# Patient Record
Sex: Male | Born: 1975
Health system: Southern US, Community
[De-identification: ages and names within clinical notes are randomized; demographics above are authoritative.]

## PROBLEM LIST (undated history)

## (undated) DIAGNOSIS — I214 Non-ST elevation (NSTEMI) myocardial infarction: Principal | ICD-10-CM

## (undated) DIAGNOSIS — R001 Bradycardia, unspecified: Secondary | ICD-10-CM

## (undated) DIAGNOSIS — M1990 Unspecified osteoarthritis, unspecified site: Secondary | ICD-10-CM

## (undated) DIAGNOSIS — K219 Gastro-esophageal reflux disease without esophagitis: Secondary | ICD-10-CM

## (undated) DIAGNOSIS — R519 Headache, unspecified: Secondary | ICD-10-CM

## (undated) DIAGNOSIS — F419 Anxiety disorder, unspecified: Secondary | ICD-10-CM

## (undated) DIAGNOSIS — J939 Pneumothorax, unspecified: Secondary | ICD-10-CM

## (undated) DIAGNOSIS — I959 Hypotension, unspecified: Secondary | ICD-10-CM

## (undated) DIAGNOSIS — E785 Hyperlipidemia, unspecified: Secondary | ICD-10-CM

## (undated) DIAGNOSIS — D689 Coagulation defect, unspecified: Secondary | ICD-10-CM

## (undated) DIAGNOSIS — Z720 Tobacco use: Secondary | ICD-10-CM

## (undated) DIAGNOSIS — F329 Major depressive disorder, single episode, unspecified: Secondary | ICD-10-CM

## (undated) DIAGNOSIS — I2510 Atherosclerotic heart disease of native coronary artery without angina pectoris: Secondary | ICD-10-CM

## (undated) DIAGNOSIS — R51 Headache: Secondary | ICD-10-CM

## (undated) DIAGNOSIS — F32A Depression, unspecified: Secondary | ICD-10-CM

## (undated) HISTORY — DX: Hypotension, unspecified: I95.9

## (undated) HISTORY — PX: ABSCESS DRAINAGE: SHX1119

## (undated) HISTORY — DX: Hyperlipidemia, unspecified: E78.5

## (undated) HISTORY — DX: Non-ST elevation (NSTEMI) myocardial infarction: I21.4

## (undated) HISTORY — DX: Bradycardia, unspecified: R00.1

## (undated) HISTORY — DX: Coagulation defect, unspecified: D68.9

## (undated) HISTORY — DX: Tobacco use: Z72.0

## (undated) HISTORY — DX: Atherosclerotic heart disease of native coronary artery without angina pectoris: I25.10

## (undated) HISTORY — DX: Gastro-esophageal reflux disease without esophagitis: K21.9

---

## 2000-01-14 ENCOUNTER — Encounter: Payer: Self-pay | Admitting: Emergency Medicine

## 2000-01-14 ENCOUNTER — Emergency Department (HOSPITAL_COMMUNITY): Admission: EM | Admit: 2000-01-14 | Discharge: 2000-01-14 | Payer: Self-pay | Admitting: Emergency Medicine

## 2005-10-28 DIAGNOSIS — J939 Pneumothorax, unspecified: Secondary | ICD-10-CM

## 2005-10-28 HISTORY — DX: Pneumothorax, unspecified: J93.9

## 2006-08-21 ENCOUNTER — Inpatient Hospital Stay (HOSPITAL_COMMUNITY): Admission: EM | Admit: 2006-08-21 | Discharge: 2006-08-24 | Payer: Self-pay | Admitting: Emergency Medicine

## 2006-09-23 ENCOUNTER — Encounter: Admission: RE | Admit: 2006-09-23 | Discharge: 2006-11-20 | Payer: Self-pay | Admitting: Orthopedic Surgery

## 2006-10-14 ENCOUNTER — Encounter: Admission: RE | Admit: 2006-10-14 | Discharge: 2006-10-14 | Payer: Self-pay | Admitting: Orthopedic Surgery

## 2007-02-05 ENCOUNTER — Ambulatory Visit (HOSPITAL_COMMUNITY): Admission: RE | Admit: 2007-02-05 | Discharge: 2007-02-05 | Payer: Self-pay | Admitting: *Deleted

## 2007-05-21 ENCOUNTER — Ambulatory Visit (HOSPITAL_COMMUNITY): Admission: RE | Admit: 2007-05-21 | Discharge: 2007-05-21 | Payer: Self-pay | Admitting: Neurosurgery

## 2007-08-16 ENCOUNTER — Emergency Department (HOSPITAL_COMMUNITY): Admission: EM | Admit: 2007-08-16 | Discharge: 2007-08-16 | Payer: Self-pay | Admitting: Emergency Medicine
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
## 2008-01-13 ENCOUNTER — Emergency Department (HOSPITAL_COMMUNITY): Admission: EM | Admit: 2008-01-13 | Discharge: 2008-01-13 | Payer: Self-pay | Admitting: Emergency Medicine

## 2010-02-08 ENCOUNTER — Emergency Department (HOSPITAL_COMMUNITY): Admission: AC | Admit: 2010-02-08 | Discharge: 2010-02-08 | Payer: Self-pay

## 2010-06-19 ENCOUNTER — Emergency Department (HOSPITAL_COMMUNITY): Admission: EM | Admit: 2010-06-19 | Discharge: 2010-06-20 | Payer: Self-pay | Admitting: Emergency Medicine

## 2011-01-11 LAB — DIFFERENTIAL
Eosinophils Absolute: 0.2 10*3/uL (ref 0.0–0.7)
Lymphocytes Relative: 34 % (ref 12–46)
Lymphs Abs: 2.5 10*3/uL (ref 0.7–4.0)
Neutrophils Relative %: 56 % (ref 43–77)

## 2011-01-11 LAB — CBC
HCT: 45.8 % (ref 39.0–52.0)
Hemoglobin: 16.7 g/dL (ref 13.0–17.0)
MCH: 33.7 pg (ref 26.0–34.0)
MCHC: 36.5 g/dL — ABNORMAL HIGH (ref 30.0–36.0)
MCV: 92.5 fL (ref 78.0–100.0)

## 2011-01-11 LAB — URINALYSIS, ROUTINE W REFLEX MICROSCOPIC
Bilirubin Urine: NEGATIVE
Glucose, UA: NEGATIVE mg/dL
Ketones, ur: NEGATIVE mg/dL
pH: 6.5 (ref 5.0–8.0)

## 2011-01-11 LAB — COMPREHENSIVE METABOLIC PANEL
ALT: 15 U/L (ref 0–53)
BUN: 8 mg/dL (ref 6–23)
CO2: 26 mEq/L (ref 19–32)
Calcium: 9.4 mg/dL (ref 8.4–10.5)
Creatinine, Ser: 1 mg/dL (ref 0.4–1.5)
GFR calc non Af Amer: >60 mL/min (ref >60)
Glucose, Bld: 91 mg/dL (ref 70–99)

## 2011-01-16 LAB — COMPREHENSIVE METABOLIC PANEL
ALT: 20 U/L (ref 0–53)
AST: 21 U/L (ref 0–37)
CO2: 23 mEq/L (ref 19–32)
Chloride: 107 mEq/L (ref 96–112)
Creatinine, Ser: 0.87 mg/dL (ref 0.4–1.5)
GFR calc Af Amer: >60 mL/min (ref >60)
GFR calc non Af Amer: >60 mL/min (ref >60)
Total Bilirubin: 0.5 mg/dL (ref 0.3–1.2)

## 2011-01-16 LAB — CBC
MCV: 98.2 fL (ref 78.0–100.0)
RBC: 4.66 MIL/uL (ref 4.22–5.81)
WBC: 10.2 10*3/uL (ref 4.0–10.5)

## 2011-01-16 LAB — APTT: aPTT: 28 seconds (ref 24–37)
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
## 2012-01-29 ENCOUNTER — Ambulatory Visit (INDEPENDENT_AMBULATORY_CARE_PROVIDER_SITE_OTHER): Payer: BC Managed Care – PPO | Admitting: Family Medicine

## 2012-01-29 ENCOUNTER — Ambulatory Visit
Admission: RE | Admit: 2012-01-29 | Discharge: 2012-01-29 | Disposition: A | Discharge status: Home / Self Care | Payer: BC Managed Care – PPO | Source: Ambulatory Visit | Attending: Family Medicine | Admitting: Family Medicine

## 2012-01-29 ENCOUNTER — Telehealth: Payer: Self-pay

## 2012-01-29 DIAGNOSIS — IMO0001 Reserved for inherently not codable concepts without codable children: Secondary | ICD-10-CM

## 2012-01-29 DIAGNOSIS — R209 Unspecified disturbances of skin sensation: Secondary | ICD-10-CM

## 2012-01-29 LAB — COMPREHENSIVE METABOLIC PANEL
ALT: 17 U/L (ref 0–53)
AST: 25 U/L (ref 0–37)
Albumin: 4.6 g/dL (ref 3.5–5.2)
Alkaline Phosphatase: 55 U/L (ref 39–117)
BUN: 7 mg/dL (ref 6–23)
CO2: 26 mEq/L (ref 19–32)
Calcium: 9.6 mg/dL (ref 8.4–10.5)
Chloride: 106 mEq/L (ref 96–112)
Creat: 0.83 mg/dL (ref 0.50–1.35)
Glucose, Bld: 81 mg/dL (ref 70–99)
Potassium: 4.2 mEq/L (ref 3.5–5.3)
Sodium: 139 mEq/L (ref 135–145)
Total Bilirubin: 0.4 mg/dL (ref 0.3–1.2)
Total Protein: 7 g/dL (ref 6.0–8.3)

## 2012-01-29 LAB — POCT SEDIMENTATION RATE: POCT SED RATE: 6 mm/hr (ref 0–22)

## 2012-01-29 LAB — POCT CBC
Granulocyte percent: 59.3 %G (ref 37–80)
HCT, POC: 48 % (ref 43.5–53.7)
Hemoglobin: 16.2 g/dL (ref 14.1–18.1)
Lymph, poc: 2 (ref 0.6–3.4)
MCH, POC: 32.3 pg — AB (ref 27–31.2)
MCHC: 33.8 g/dL (ref 31.8–35.4)
MCV: 95.8 fL (ref 80–97)
MID (cbc): 0.5 (ref 0–0.9)
MPV: 8.8 fL (ref 0–99.8)
POC Granulocyte: 3.6 (ref 2–6.9)
POC LYMPH PERCENT: 32.5 %L (ref 10–50)
POC MID %: 8.2 %M (ref 0–12)
Platelet Count, POC: 315 10*3/uL (ref 142–424)
RBC: 5.01 M/uL (ref 4.69–6.13)
RDW, POC: 14.7 %
WBC: 6.1 10*3/uL (ref 4.6–10.2)

## 2012-01-29 LAB — GLUCOSE, POCT (MANUAL RESULT ENTRY): POC Glucose: 77

## 2012-01-29 NOTE — Telephone Encounter (Signed)
One, patient would like to know when his labs will be back.  Two, patient wants to know if it is okay for him to go to the beach tomorrow.   He was seen today by Dr. Milus Glazier

## 2012-01-29 NOTE — Telephone Encounter (Signed)
Per Alycia Rossetti advised pt to stay local and wait on lab results.  He wanted to know if the CT is normal then what else could it be.  He mentioned something about pinched nerve and stated that you guys only brought it up once.  He states that his numbness is now just in his hands and face.  He also complains of headache.  Please advise.

## 2012-01-29 NOTE — Progress Notes (Signed)
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 He denies any headache or head injury. He did have some achiness after work yesterday and a friend gave him Percocet which he took. He has a family history of strokes there is paternal grandfather and maternal aunt.  He says he has had some blurry vision and it may have been double vision but is not sure but this is cleared. [name] had no chest pain trouble swallowing or movement disorder.  Patient denies taking recreational drugs.  Objective: Patient brought back urgently   Patient states that he did not go to work today but he appears to have been at work in the sense that he has hand surgery is closed her soiled as it didn't work. Alert young adult male with an unusual affect who seems to be disconnected. Her HEENT: Unremarkable  Neck: Supple no adenopathy  Chest: Clear  Heart: Regular no murmur or gallop  Abdomen: Soft nontender no HSM no masses nontender  Neuro exam: Alert with unusual affect, cranial nerves III through XII intact  Reflexes symmetric and normal in the biceps triceps knee jerks and ankle jerks  Moving toward chimneys equally gait normal  Skin: Unremarkable-warm and dry  Assessment: Atypical syndrome of paresthesias which are not getting into any usual diagnostic pattern  Plan: CT scan, lab tests to rule out serious causes. This may be a conversion reaction or early schizophrenia.

## 2012-01-30 ENCOUNTER — Encounter: Payer: Self-pay | Admitting: Family Medicine

## 2012-01-30 NOTE — Telephone Encounter (Signed)
LMOM to CB. 

## 2012-01-30 NOTE — Telephone Encounter (Signed)
As I explained to the patient, this may be a viral syndrome or unexplained transient nerve dysfunction.  We are not finding anything seriously wrong.  I suggest he give the symptoms several days to resolve.  If he worsens or develops new bothersome symptoms, he should return for reevaluation

## 2012-01-31 NOTE — Telephone Encounter (Signed)
Patient stated he spoke with someone in our office yesterday and all symptoms have resolved.

## 2012-09-09 ENCOUNTER — Encounter (HOSPITAL_COMMUNITY): Payer: Self-pay | Admitting: *Deleted

## 2012-09-09 ENCOUNTER — Emergency Department (HOSPITAL_COMMUNITY)
Admission: EM | Admit: 2012-09-09 | Discharge: 2012-09-09 | Disposition: A | Discharge status: Home / Self Care | Payer: BC Managed Care – PPO | Source: Home / Self Care | Attending: Family Medicine | Admitting: Family Medicine

## 2012-09-09 ENCOUNTER — Emergency Department (INDEPENDENT_AMBULATORY_CARE_PROVIDER_SITE_OTHER): Payer: BC Managed Care – PPO

## 2012-09-09 DIAGNOSIS — M79609 Pain in unspecified limb: Secondary | ICD-10-CM

## 2012-09-09 DIAGNOSIS — M79642 Pain in left hand: Secondary | ICD-10-CM

## 2012-09-09 HISTORY — DX: Pneumothorax, unspecified: J93.9

## 2012-09-09 MED ORDER — IBUPROFEN 800 MG PO TABS: 800.0000 mg | ORAL_TABLET | Freq: Three times a day (TID) | ORAL | Status: DC

## 2012-09-09 NOTE — ED Provider Notes (Signed)
History     CSN: [id number]  Arrival date & time 09/09/12  1747   First MD Initiated Contact with Patient 09/09/12 1841      Chief Complaint  Patient presents with  . Hand Injury    (Consider location/radiation/quality/duration/timing/severity/associated sxs/prior treatment) Patient is a 36 y.o. male presenting with hand pain. The history is provided by the patient.  Hand Pain This is a new problem. The current episode started yesterday. The problem occurs hourly. The problem has not changed since onset.The symptoms are aggravated by bending and twisting. Nothing relieves the symptoms. He has tried nothing for the symptoms. The treatment provided no relief.  pt works in Lobbyist work, states he is not sure if he hit it on something at work yesterday, pain started in hand last night, swelling noted this morning.  Past Medical History  Diagnosis Date  . Pneumothorax     History reviewed. No pertinent past surgical history.  Family History  Problem Relation Age of Onset  . Heart attack Other   . Stroke Maternal Aunt     History  Substance Use Topics  . Smoking status: Current Every Day Smoker -- 1.0 packs/day  . Smokeless tobacco: Not on file  . Alcohol Use: No      Review of Systems  Musculoskeletal: Positive for joint swelling and arthralgias.  All other systems reviewed and are negative.    Allergies  Review of patient's allergies indicates no known allergies.  Home Medications   Current Outpatient Rx  Name  Route  Sig  Dispense  Refill  . IBUPROFEN 800 MG PO TABS   Oral   Take 1 tablet (800 mg total) by mouth 3 (three) times daily.   21 tablet   0     BP 118/67  Pulse 75  Temp 98.3 F (36.8 C) (Oral)  Resp 16  SpO2 97%  Physical Exam  Nursing note and vitals reviewed. Constitutional: He is oriented to person, place, and time. Vital signs are normal. He appears well-developed and well-nourished. He is active and cooperative.  HENT:  Head:  Normocephalic.  Eyes: Conjunctivae normal are normal. Pupils are equal, round, and reactive to light. No scleral icterus.  Neck: Trachea normal. Neck supple.  Cardiovascular: Normal rate, regular rhythm and normal heart sounds.   Pulmonary/Chest: Effort normal and breath sounds normal.  Musculoskeletal: Normal range of motion.       Right hand: Normal.       Left hand: Normal.       Hands: Neurological: He is alert and oriented to person, place, and time. He has normal strength. No cranial nerve deficit or sensory deficit. GCS eye subscore is 4. GCS verbal subscore is 5. GCS motor subscore is 6.  Skin: Skin is warm and dry.  Psychiatric: He has a normal mood and affect. His speech is normal and behavior is normal. Judgment and thought content normal. Cognition and memory are normal.    ED Course  Procedures (including critical care time)  Labs Reviewed - No data to display Dg Hand Complete Left  09/09/2012  *RADIOLOGY REPORT*  Clinical Data: Pain and soft tissue swelling  LEFT HAND - COMPLETE 3+ VIEW  Comparison: 01/13/2008  Findings: No evidence of fracture or dislocation.  There is chronic deformity of the DIP joint of the index finger related to old trauma.  There is a 2 mm radiopaque foreign object in the soft tissues of the volar to the interphalangeal joint that looks like a small  piece of wire.  IMPRESSION: Chronic deformity of the DIP joint of the index finger related to previous trauma.  Small radiopaque foreign object within the soft tissues volar to the interphalangeal joint of the thumb.   Original Report Authenticated By: Paulina Fusi, M.D.      1. Hand pain, left       MDM  Ace wrap, ibuprofen for pain, follow up with orthopedist as needed.        Johnsie Kindred, NP 09/09/12 1907

## 2012-09-09 NOTE — ED Notes (Signed)
L hand sore last night and noted swelling to dorsum of hand this morning.  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

## 2012-09-10 NOTE — ED Provider Notes (Signed)
Medical screening examination/treatment/procedure(s) were performed by resident physician or non-physician practitioner and as supervising physician I was immediately available for consultation/collaboration.   Joya Willmott DOUGLAS MD.    Jyra Lagares D Thurmond Hildebran, MD 09/10/12 2125 

## 2012-12-06 ENCOUNTER — Emergency Department (HOSPITAL_COMMUNITY)
Admission: EM | Admit: 2012-12-06 | Discharge: 2012-12-06 | Disposition: A | Discharge status: Home / Self Care | Payer: BC Managed Care – PPO | Source: Home / Self Care

## 2012-12-06 ENCOUNTER — Emergency Department (INDEPENDENT_AMBULATORY_CARE_PROVIDER_SITE_OTHER): Payer: BC Managed Care – PPO

## 2012-12-06 ENCOUNTER — Encounter (HOSPITAL_COMMUNITY): Payer: Self-pay | Admitting: Emergency Medicine

## 2012-12-06 DIAGNOSIS — R0789 Other chest pain: Secondary | ICD-10-CM

## 2012-12-06 MED ORDER — GI COCKTAIL ~~LOC~~
30.0000 mL | Freq: Once | ORAL | Status: AC
  Administered 2012-12-06: 30 mL via ORAL

## 2012-12-06 MED ORDER — GI COCKTAIL ~~LOC~~
ORAL | Status: AC
  Filled 2012-12-06: qty 30

## 2012-12-06 NOTE — ED Provider Notes (Signed)
Ricky [name] is a 37 y.o. male who presents to Urgent Care today for chest tightness present for 10 days. No radiating pain weakness or numbness Worse with eating. Not worse with exertion. No palpitations syncope. Patient also noted some shortness of breath.  Denies cough. [name] tried some ibuprofen which has not helped.  He denies any history of cardiac problems. He does note a pertinent past medical history for pneumothorax as a child.   He feels well otherwise   PMH reviewed. Pneumothorax History  Substance Use Topics  . Smoking status: Current Every Day Smoker -- 0.50 packs/day    Types: Cigarettes  . Smokeless tobacco: Not on file  . Alcohol Use: No   ROS as above Medications reviewed. No current facility-administered medications for this encounter.   Current Outpatient Prescriptions  Medication Sig Dispense Refill  . ibuprofen (ADVIL,MOTRIN) 800 MG tablet Take 1 tablet (800 mg total) by mouth 3 (three) times daily.  21 tablet  0    Exam:  BP 119/79  Pulse 73  Temp(Src) 98.6 F (37 C) (Oral)  Resp 18  SpO2 97% Gen: Well NAD HEENT: EOMI,  MMM Lungs: CTABL Nl WOB Heart: RRR no MRG Abd: NABS, NT, ND Exts: Non edematous BL  LE, warm and well perfused.   No improvement with GI cocktail. No results found for this or any previous visit (from the past 24 hour(s)). Dg Chest 2 View  12/06/2012  *RADIOLOGY REPORT*  Clinical Data: Left-sided chest pain for 10 days, smoking history  CHEST - 2 VIEW  Comparison: Chest x-ray of 02/08/2010  Findings: No active infiltrate or effusion is seen.  Mediastinal contours appear normal.  The heart is within normal limits in size. No skeletal abnormality is seen.  IMPRESSION: No active lung disease.   Original Report Authenticated By: Dwyane Dee, M.D.     Twelve-lead EKG: Normal sinus rhythm at 80 beats per minute. No ST segment abnormalities. Possible left atrial enlargement  Assessment and Plan: 37 y.o. male with chest tightness.  Possibly  anxiety. EKG and chest x-ray are essentially normal.  Very low probability of PE. However will obtain a d-dimer.  Will call patient if d-dimer positive.  Refer patient to Cardiology.  Discussed warning signs or symptoms. Please see discharge instructions. Patient expresses understanding.      Rodolph Bong, MD 12/06/12 1418  Addendum: D-dimer negative. Call patient explained negative test results followup with cardiology.  Rodolph Bong, MD 12/06/12 [phone]

## 2012-12-06 NOTE — ED Notes (Signed)
Pt c/o left chest pain and tightness with sob. Some episodes of dizziness x 1 wk 1/2. Family hx of heart problems.

## 2012-12-07 NOTE — ED Provider Notes (Signed)
Medical screening examination/treatment/procedure(s) were performed by resident physician or non-physician practitioner and as supervising physician I was immediately available for consultation/collaboration.   Trice Aspinall DOUGLAS MD.   Beauty Pless D Zedrick Springsteen, MD 12/07/12 1442 

## 2012-12-08 ENCOUNTER — Other Ambulatory Visit (HOSPITAL_COMMUNITY): Payer: Self-pay | Admitting: Interventional Radiology

## 2013-04-19 ENCOUNTER — Encounter (HOSPITAL_COMMUNITY): Payer: Self-pay

## 2013-04-19 DIAGNOSIS — L732 Hidradenitis suppurativa: Secondary | ICD-10-CM | POA: Diagnosis present

## 2013-04-19 DIAGNOSIS — F17200 Nicotine dependence, unspecified, uncomplicated: Secondary | ICD-10-CM | POA: Diagnosis present

## 2013-04-19 DIAGNOSIS — L988 Other specified disorders of the skin and subcutaneous tissue: Secondary | ICD-10-CM | POA: Diagnosis present

## 2013-04-19 DIAGNOSIS — IMO0002 Reserved for concepts with insufficient information to code with codable children: Principal | ICD-10-CM | POA: Diagnosis present

## 2013-04-19 LAB — CBC WITH DIFFERENTIAL/PLATELET
Basophils Absolute: 0 10*3/uL (ref 0.0–0.1)
Eosinophils Absolute: 0.1 10*3/uL (ref 0.0–0.7)
Eosinophils Relative: 0 % (ref 0–5)
Lymphs Abs: 2.8 10*3/uL (ref 0.7–4.0)
MCH: 34.1 pg — ABNORMAL HIGH (ref 26.0–34.0)
MCV: 92.2 fL (ref 78.0–100.0)
Neutrophils Relative %: 80 % — ABNORMAL HIGH (ref 43–77)
Platelets: 267 10*3/uL (ref 150–400)
RBC: 4.75 MIL/uL (ref 4.22–5.81)
RDW: 13.6 % (ref 11.5–15.5)
WBC: 20.8 10*3/uL — ABNORMAL HIGH (ref 4.0–10.5)

## 2013-04-19 LAB — POCT I-STAT, CHEM 8
HCT: 49 % (ref 39.0–52.0)
Hemoglobin: 16.7 g/dL (ref 13.0–17.0)
Potassium: 3.5 mEq/L (ref 3.5–5.1)
Sodium: 138 mEq/L (ref 135–145)
TCO2: 23 mmol/L (ref 0–100)

## 2013-04-19 NOTE — ED Notes (Signed)
Pt c/o "lumps" to his Left axillary today approx 0700 this am and by this afternoon pt noted multiple abscess to bilary axillary region, all over body aches, and fever. Several small red areas noted on exam, increase pain w/palpation.

## 2013-04-20 ENCOUNTER — Inpatient Hospital Stay (HOSPITAL_COMMUNITY)
Admission: EM | Admit: 2013-04-20 | Discharge: 2013-04-22 | DRG: 278 | Disposition: A | Discharge status: Home / Self Care | Payer: BC Managed Care – PPO | Attending: Internal Medicine | Admitting: Internal Medicine

## 2013-04-20 DIAGNOSIS — L732 Hidradenitis suppurativa: Secondary | ICD-10-CM

## 2013-04-20 DIAGNOSIS — L0291 Cutaneous abscess, unspecified: Secondary | ICD-10-CM

## 2013-04-20 DIAGNOSIS — D72829 Elevated white blood cell count, unspecified: Secondary | ICD-10-CM | POA: Diagnosis present

## 2013-04-20 DIAGNOSIS — IMO0002 Reserved for concepts with insufficient information to code with codable children: Principal | ICD-10-CM

## 2013-04-20 DIAGNOSIS — L0390 Cellulitis, unspecified: Secondary | ICD-10-CM

## 2013-04-20 DIAGNOSIS — L02412 Cutaneous abscess of left axilla: Secondary | ICD-10-CM

## 2013-04-20 DIAGNOSIS — L02411 Cutaneous abscess of right axilla: Secondary | ICD-10-CM | POA: Diagnosis present

## 2013-04-20 MED ORDER — ONDANSETRON HCL 4 MG/2ML IJ SOLN
4.0000 mg | Freq: Once | INTRAMUSCULAR | Status: AC
  Administered 2013-04-20: 4 mg via INTRAVENOUS
  Filled 2013-04-20: qty 2

## 2013-04-20 MED ORDER — SODIUM CHLORIDE 0.9 % IJ SOLN
3.0000 mL | Freq: Two times a day (BID) | INTRAMUSCULAR | Status: DC
  Administered 2013-04-20 – 2013-04-21 (×2): 3 mL via INTRAVENOUS

## 2013-04-20 MED ORDER — ACETAMINOPHEN 650 MG RE SUPP: 650.0000 mg | Freq: Four times a day (QID) | RECTAL | Status: DC | PRN

## 2013-04-20 MED ORDER — VANCOMYCIN HCL 10 G IV SOLR
1250.0000 mg | Freq: Two times a day (BID) | INTRAVENOUS | Status: DC
  Administered 2013-04-20 – 2013-04-22 (×5): 1250 mg via INTRAVENOUS
  Filled 2013-04-20 (×6): qty 1250

## 2013-04-20 MED ORDER — PIPERACILLIN-TAZOBACTAM 3.375 G IVPB 30 MIN
3.3750 g | INTRAVENOUS | Status: AC
  Administered 2013-04-20: 3.375 g via INTRAVENOUS
  Filled 2013-04-20: qty 50

## 2013-04-20 MED ORDER — ACETAMINOPHEN 325 MG PO TABS
650.0000 mg | ORAL_TABLET | Freq: Four times a day (QID) | ORAL | Status: DC | PRN
  Administered 2013-04-20 – 2013-04-21 (×3): 650 mg via ORAL
  Filled 2013-04-20 (×5): qty 2

## 2013-04-20 MED ORDER — FENTANYL CITRATE 0.05 MG/ML IJ SOLN
50.0000 ug | INTRAMUSCULAR | Status: DC | PRN
  Administered 2013-04-20 – 2013-04-21 (×9): 50 ug via INTRAVENOUS
  Filled 2013-04-20 (×9): qty 2

## 2013-04-20 MED ORDER — VANCOMYCIN HCL IN DEXTROSE 1-5 GM/200ML-% IV SOLN
1000.0000 mg | Freq: Once | INTRAVENOUS | Status: AC
  Administered 2013-04-20: 1000 mg via INTRAVENOUS
  Filled 2013-04-20: qty 200

## 2013-04-20 MED ORDER — PIPERACILLIN-TAZOBACTAM 3.375 G IVPB
3.3750 g | Freq: Three times a day (TID) | INTRAVENOUS | Status: DC
  Administered 2013-04-20 – 2013-04-22 (×5): 3.375 g via INTRAVENOUS
  Filled 2013-04-20 (×8): qty 50

## 2013-04-20 NOTE — Progress Notes (Signed)
[facility] PROGRESS NOTE  Ricky [name] ZOX:[id number] DOB: 1976/10/13 DOA: 04/20/2013 PCP: No PCP Per Patient  Assessment/Plan: 1. Bilateral axillary Hidraadenitis - patient has marginal fever; leukocytosis with left shift  - more significant on the left than right, no significant fluctuant abscess - continue vancomycin, add zosyn per surgery recommendation - warm compresses - pain control  - consult surgery to see if the left axilla need's I&D  Code Status: Full Family Communication: none Disposition Plan: inpatient   Consultants:  General surgery  Procedures:  none  Antibiotics:  vancomycin  HPI/Subjective: Patient has experienced intermittent chills during the day with low-grade fever. He still complains of moderate pain in his axilla. No other complaints.   Objective: Filed Vitals:   04/19/13 2349 04/20/13 0229 04/20/13 0333 04/20/13 0615  BP: 103/67 103/52 122/68 104/53  Pulse: 75 75 87 103  Temp: 98.4 F (36.9 C)  98.5 F (36.9 C) 100.2 F (37.9 C)  TempSrc: Oral  Oral Oral  Resp: 14 18 18 18   Height:   5\' 10"  (1.778 m)   Weight:   79.379 kg (175 lb)   SpO2: 100% 99% 100% 97%    Intake/Output Summary (Last 24 hours) at 04/20/13 1011 Last data filed at 04/20/13 0615  Gross per 24 hour  Intake    180 ml  Output      0 ml  Net    180 ml   Filed Weights   04/20/13 0333  Weight: 79.379 kg (175 lb)    Exam:   General:  WDWN male in no apparent distress  Cardiovascular: regular rate and rhythm, no murmurs, gallops, or rubs  Respiratory: clear to auscultation bilaterally, no rales or rhonchi, no increased work of breathing.   Abdomen: +BS, soft, nontender, no distension, no masses or organomegaly  Skin: multiple 2-3 cm erythematous papules in axilla bilaterally, significant induration noted; no punctum, clear fluctuance, or drainage noted. Larger 3 cm x 1 cm carbuncle in left axilla, indurated, but no clear fluctuance.   Data  Reviewed: Basic Metabolic Panel:  Recent Labs Lab 04/19/13 2028  NA 138  K 3.5  CL 104  GLUCOSE 93  BUN 11  CREATININE 1.20   Liver Function Tests: No results found for this basename: AST, ALT, ALKPHOS, BILITOT, PROT, ALBUMIN,  in the last 168 hours No results found for this basename: LIPASE, AMYLASE,  in the last 168 hours No results found for this basename: AMMONIA,  in the last 168 hours CBC:  Recent Labs Lab 04/19/13 2017 04/19/13 2028  WBC 20.8*  --   NEUTROABS 16.7*  --   HGB 16.2 16.7  HCT 43.8 49.0  MCV 92.2  --   PLT 267  --    Cardiac Enzymes: No results found for this basename: CKTOTAL, CKMB, CKMBINDEX, TROPONINI,  in the last 168 hours BNP (last 3 results) No results found for this basename: PROBNP,  in the last 8760 hours CBG: No results found for this basename: GLUCAP,  in the last 168 hours  No results found for this or any previous visit (from the past 240 hour(s)).   Studies: No results found.  Scheduled Meds: . sodium chloride  3 mL Intravenous Q12H  . vancomycin  1,250 mg Intravenous Q12H   Continuous Infusions:   Principal Problem:   Abscess of axilla, left Active Problems:   Abscess of axilla, right   Leukocytosis, unspecified   Maris Berger PA-S    [facility]  If 7PM-7AM, please contact  night-coverage at [email or web address], password [name] 04/20/2013, 10:11 AM  LOS: 0 days    Attending Patient seen and examined, agree with the assessment and plan. Admitted with B/L Hidradenitis-left axilla looks to be somewhat fluctuant, consult CCS. Monitor clinically to see if it evolves into a frank abscess.  S Tilda Samudio

## 2013-04-20 NOTE — Consult Note (Signed)
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  Non-operative management at this point, no palpable abscess noted.  Warm compresses and IV VancoMarta Lamas. Gae Bon, MD, FACS [phone] [facility]

## 2013-04-20 NOTE — Consult Note (Signed)
Ricky [name] Quaker [birth year]  [id number].    Requesting MD: Louie Bun, PA-C Chief Complaint/Reason for Consult: B/L axillary abscesses HPI: 37 y/o male c/o 1 day history of small red bumps under his left axilla.  He also did not feel well, started to have fevers, chills, and a headache as the day went on.  He noticed the red bumps spreading in multiple areas in both axillas.  No N/V/D, abdominal pain, changes in bowel or bladder habits.  No trauma or recent shaving to the area.  He denies ever having this in the past or similar lesions anywhere else like the groin or buttock.  He notes he was in the hot tube this past weekend, but he denies any bathing suite rash.  No other exposures from family/friends with similar lesions.    ROS: All systems reviewed and otherwise negative except for as above  Family History  Problem Relation Age of Onset  . Heart attack Other   . Stroke Maternal Aunt     Past Medical History  Diagnosis Date  . Pneumothorax     History reviewed. No pertinent past surgical history.  Social History:  reports that he has been smoking Cigarettes.  He has been smoking about 1 packs per day for last 15 years. He does not have any smokeless tobacco history on file. He reports that he does not drink alcohol or use illicit drugs.  Allergies: No Known Allergies  Medications Prior to Admission  Medication Sig Dispense Refill  . Ibuprofen (ADVIL PO) Take 3-4 tablets by mouth once.        Blood pressure 104/53, pulse 103, temperature 100.2 F (37.9 C), temperature source Oral, resp. rate 18, height 5\' 10"  (1.778 m), weight 175 lb (79.379 kg), SpO2 97.00%. Physical Exam: General: pleasant, WD/WN white male who is laying in bed in NAD HEENT: head is normocephalic, atraumatic.  Sclera are noninjected.  PERRL.  Ears and nose without any masses or lesions.  Mouth is pink and moist Heart: regular, rate, and rhythm.  No obvious murmurs, gallops, or rubs noted.  Palpable pedal  pulses bilaterally Lungs: CTAB, no wheezes, rhonchi, or rales noted.  Respiratory effort nonlabored Abd: soft, NT/ND, +BS, no masses, hernias, or organomegaly MS: all 4 extremities are symmetrical with no cyanosis, clubbing, or edema. Skin: Multiple erythematous papules approximately 2-3cm round in b/l axilla R<L, significant induration, but no clear area of fluctuance; no punctum, ulceration or drainage; larger carbuncle in left axilla approximately 3cm x 0.75cm which is largely indurated, but no fluctuance Psych: A&Ox3 with an appropriate affect.  Results for orders placed during the hospital encounter of 04/20/13 (from the past 48 hour(s))  CBC WITH DIFFERENTIAL     Status: Abnormal   Collection Time    04/19/13  8:17 PM      Result Value Range   WBC 20.8 (*) 4.0 - 10.5 K/uL   RBC 4.75  4.22 - 5.81 MIL/uL   Hemoglobin 16.2  13.0 - 17.0 g/dL   HCT 40.9  81.1 - 91.4 %   MCV 92.2  78.0 - 100.0 fL   MCH 34.1 (*) 26.0 - 34.0 pg   MCHC 37.0 (*) 30.0 - 36.0 g/dL   RDW 78.2  95.6 - 21.3 %   Platelets 267  150 - 400 K/uL   Neutrophils Relative % 80 (*) 43 - 77 %   Neutro Abs 16.7 (*) 1.7 - 7.7 K/uL   Lymphocytes Relative 13  12 - 46 %  Lymphs Abs 2.8  0.7 - 4.0 K/uL   Monocytes Relative 7  3 - 12 %   Monocytes Absolute 1.4 (*) 0.1 - 1.0 K/uL   Eosinophils Relative 0  0 - 5 %   Eosinophils Absolute 0.1  0.0 - 0.7 K/uL   Basophils Relative 0  0 - 1 %   Basophils Absolute 0.0  0.0 - 0.1 K/uL  POCT I-STAT, CHEM 8     Status: None   Collection Time    04/19/13  8:28 PM      Result Value Range   Sodium 138  135 - 145 mEq/L   Potassium 3.5  3.5 - 5.1 mEq/L   Chloride 104  96 - 112 mEq/L   BUN 11  6 - 23 mg/dL   Creatinine, Ser 1.61  0.50 - 1.35 mg/dL   Glucose, Bld 93  70 - 99 mg/dL   Calcium, Ion 0.96  0.45 - 1.23 mmol/L   TCO2 23  0 - 100 mmol/L   Hemoglobin 16.7  13.0 - 17.0 g/dL   HCT 40.9  81.1 - 91.4 %   No results found.     Assessment/Plan B/L axillary  furuncles/carbuncles, more significant on the left, no significant fluctuant abscess 1.  Continue IV antibiotics 2.  Pain control 3.  Okay for regular diet 4.  Hot packs 5.  Add Zosyn to cover for pseudomonas infection from recent hot tub exposure 6.  Would get MRSA swab and use contact protocols   DORT, Kymir Coles 04/20/2013, 9:55 AM Pager: [phone]

## 2013-04-20 NOTE — ED Provider Notes (Signed)
History    CSN: [id number] Arrival date & time 04/19/13  2003  First MD Initiated Contact with Patient 04/20/13 0123     Chief Complaint  Patient presents with  . Abscess  . Fever   (Consider location/radiation/quality/duration/timing/severity/associated sxs/prior Treatment) HPI Hx per PT - woke up today not feeling well, noticed small bump L axilla. By the afternoon had large painful areas in bilateral axilla, with fever and chills. No trauma, no shaving, no h/o same.  He was in a hot tub over the weekend but denies any other known exposures. Symptoms MOD in severity. No N/V. No cats in the house  Past Medical History  Diagnosis Date  . Pneumothorax    History reviewed. No pertinent past surgical history. Family History  Problem Relation Age of Onset  . Heart attack Other   . Stroke Maternal Aunt    History  Substance Use Topics  . Smoking status: Current Every Day Smoker -- 0.50 packs/day    Types: Cigarettes  . Smokeless tobacco: Not on file  . Alcohol Use: No    Review of Systems  Constitutional: Positive for fever and chills.  HENT: Negative for neck pain and neck stiffness.   Eyes: Negative for visual disturbance.  Respiratory: Negative for shortness of breath.   Cardiovascular: Negative for chest pain.  Gastrointestinal: Negative for vomiting.  Genitourinary: Negative for dysuria.  Musculoskeletal: Negative for back pain.  Skin: Positive for wound. Negative for rash.  Neurological: Negative for headaches.  All other systems reviewed and are negative.    Allergies  Review of patient's allergies indicates no known allergies.  Home Medications   Current Outpatient Rx  Name  Route  Sig  Dispense  Refill  . Ibuprofen (ADVIL PO)   Oral   Take 3-4 tablets by mouth once.          BP 103/67  Pulse 75  Temp(Src) 98.4 F (36.9 C) (Oral)  Resp 14  SpO2 100% Physical Exam  Constitutional: He is oriented to person, place, and time. He appears  well-developed and well-nourished.  HENT:  Head: Normocephalic and atraumatic.  Eyes: EOM are normal. Pupils are equal, round, and reactive to light.  Neck: Neck supple.  Cardiovascular: Regular rhythm and intact distal pulses.   Pulmonary/Chest: Effort normal. No respiratory distress.  Musculoskeletal: Normal range of motion.  L > R axilla areas of tenderness and fluctuance, no active drainage   Neurological: He is alert and oriented to person, place, and time.  Skin: Skin is warm and dry.    ED Course  Procedures (including critical care time)  Results for orders placed during the hospital encounter of 04/20/13  CBC WITH DIFFERENTIAL      Result Value Range   WBC 20.8 (*) 4.0 - 10.5 K/uL   RBC 4.75  4.22 - 5.81 MIL/uL   Hemoglobin 16.2  13.0 - 17.0 g/dL   HCT 40.9  81.1 - 91.4 %   MCV 92.2  78.0 - 100.0 fL   MCH 34.1 (*) 26.0 - 34.0 pg   MCHC 37.0 (*) 30.0 - 36.0 g/dL   RDW 78.2  95.6 - 21.3 %   Platelets 267  150 - 400 K/uL   Neutrophils Relative % 80 (*) 43 - 77 %   Neutro Abs 16.7 (*) 1.7 - 7.7 K/uL   Lymphocytes Relative 13  12 - 46 %   Lymphs Abs 2.8  0.7 - 4.0 K/uL   Monocytes Relative 7  3 - 12 %  Monocytes Absolute 1.4 (*) 0.1 - 1.0 K/uL   Eosinophils Relative 0  0 - 5 %   Eosinophils Absolute 0.1  0.0 - 0.7 K/uL   Basophils Relative 0  0 - 1 %   Basophils Absolute 0.0  0.0 - 0.1 K/uL  POCT I-STAT, CHEM 8      Result Value Range   Sodium 138  135 - 145 mEq/L   Potassium 3.5  3.5 - 5.1 mEq/L   Chloride 104  96 - 112 mEq/L   BUN 11  6 - 23 mg/dL   Creatinine, Ser 4.09  0.50 - 1.35 mg/dL   Glucose, Bld 93  70 - 99 mg/dL   Calcium, Ion 8.11  9.14 - 1.23 mmol/L   TCO2 23  0 - 100 mmol/L   Hemoglobin 16.7  13.0 - 17.0 g/dL   HCT 78.2  95.6 - 21.3 %   IV ABX  IV fentanyl pain control  2:06 AM MED consult d/w DR Julian Reil, plan admit   MDM  Fever, multiple abscess, elevated WBC  Medications provided  MED admit  Sunnie Nielsen, MD 04/20/13 [id number]

## 2013-04-20 NOTE — Progress Notes (Signed)
Pt arrived from ED, and was admitted and assessed at 0340.  Per orders, admitting doctor paged upon patient's arrival on unit at 0351.  No new orders as of this time.  Will cont to monitor pt status.  ~ Marrion Coy, RN

## 2013-04-20 NOTE — H&P (Signed)
[facility] History and Physical  Ricky [name]:[id number] DOB: [birth year] DOA: 04/20/2013  Referring physician: ED PCP: No PCP Per Patient  Specialists: None  Chief Complaint: Abscesses, fever  HPI: Ricky [name] is a 37 y.o. male who woke up today not feeling well, and noticed a small bump in his L axilla.  By the afternoon he had large painful areas in B axillary regions with fever and chills.  No trauma, no shaving, no h/o same in past, no cat scratch, was in a hot tub over the weekend but denies any other known exposure or any other location of skin rash.  Symptoms are mild to moderate in severity.  In the ED he was found to have a WBC of 20.8k and temperature of 99.2.  He was started on vancomycin, the areas of fluctuance wernt really organized enough yet at this point to drain per EDP, hospitalist has been asked to admit.  Review of Systems: 12 systems reviewed and otherwise negative.  Past Medical History  Diagnosis Date  . Pneumothorax    History reviewed. No pertinent past surgical history. Social History:  reports that he has been smoking Cigarettes.  He has been smoking about 0.50 packs per day. He does not have any smokeless tobacco history on file. He reports that he does not drink alcohol or use illicit drugs.   No Known Allergies  Family History  Problem Relation Age of Onset  . Heart attack Other   . Stroke Maternal Aunt      Prior to Admission medications   Medication Sig Start Date End Date Taking? Authorizing Provider  Ibuprofen (ADVIL PO) Take 3-4 tablets by mouth once.   Yes Historical Provider, MD   Physical Exam: Filed Vitals:   04/19/13 2009 04/19/13 2349 04/20/13 0229 04/20/13 0333  BP: 127/79 103/67 103/52 122/68  Pulse: 102 75 75 87  Temp: 99.2 F (37.3 C) 98.4 F (36.9 C)  98.5 F (36.9 C)  TempSrc: Oral Oral  Oral  Resp: 19 14 18 18   Height:    5\' 10"  (1.778 m)  Weight:    79.379 kg (175 lb)  SpO2: 98% 100% 99% 100%     General:  NAD, resting comfortably in bed Eyes: PEERLA EOMI ENT: mucous membranes moist Neck: supple w/o JVD Cardiovascular: RRR w/o MRG Respiratory: CTA B Abdomen: soft, nt, nd, bs+ Skin: areas of erythema and fluctuance on B axilla L > R, no drainage at this time but appears to be forming into probable abscess. Musculoskeletal: MAE,  Psychiatric: normal tone and affect Neurologic: AAOx3, grossly non-focal  Labs on Admission:  Basic Metabolic Panel:  Recent Labs Lab 04/19/13 2028  NA 138  K 3.5  CL 104  GLUCOSE 93  BUN 11  CREATININE 1.20   Liver Function Tests: No results found for this basename: AST, ALT, ALKPHOS, BILITOT, PROT, ALBUMIN,  in the last 168 hours No results found for this basename: LIPASE, AMYLASE,  in the last 168 hours No results found for this basename: AMMONIA,  in the last 168 hours CBC:  Recent Labs Lab 04/19/13 2017 04/19/13 2028  WBC 20.8*  --   NEUTROABS 16.7*  --   HGB 16.2 16.7  HCT 43.8 49.0  MCV 92.2  --   PLT 267  --    Cardiac Enzymes: No results found for this basename: CKTOTAL, CKMB, CKMBINDEX, TROPONINI,  in the last 168 hours  BNP (last 3 results) No results found for this basename: PROBNP,  in the last 8760 hours CBG: No results found for this basename: GLUCAP,  in the last 168 hours  Radiological Exams on Admission: No results found.  EKG: Independently reviewed.  Assessment/Plan Principal Problem:   Abscess of axilla, left Active Problems:   Abscess of axilla, right   Leukocytosis, unspecified   1. B axillary abscess - Somewhat suspicious for a new diagnosis of hidradenitis supprativa, DDx includes exposure to organism in hot tub but he doesn't appear to have folliculitis at any other location.  Treating with Vancomycin emperically, likely will need drainage in the next couple of days after the abscesses on B axillas organize. 2. Leukocytosis - due to abscesses, trend with repeat CBC in AM.    Code  Status: Full Code (must indicate code status--if unknown or must be presumed, indicate so) Family Communication: No family in room (indicate person spoken with, if applicable, with phone number if by telephone) Disposition Plan: Admit to obs (indicate anticipated LOS)  Time spent: 50 min  Chancy Claros M. [facility] Pager [phone]  If 7PM-7AM, please contact night-coverage [email or web address] Password [facility] 04/20/2013, 5:05 AM

## 2013-04-20 NOTE — Progress Notes (Signed)
ANTIBIOTIC CONSULT NOTE - follow up  Pharmacy Consult for vancomycin and Zosyn Indication: abscess   No Known Allergies  Patient Measurements: Height: 5\' 10"  (177.8 cm) Weight: 175 lb (79.379 kg) IBW/kg (Calculated) : 73  Vital Signs: Temp: 100.2 F (37.9 C) (06/24 0615) Temp src: Oral (06/24 0615) BP: 104/53 mmHg (06/24 0615) Pulse Rate: 103 (06/24 0615) Intake/Output from previous day: 06/23 0701 - 06/24 0700 In: 180 [P.O.:180] Out: -  Intake/Output from this shift:    Labs:  Recent Labs  04/19/13 2017 04/19/13 2028  WBC 20.8*  --   HGB 16.2 16.7  PLT 267  --   CREATININE  --  1.20   Estimated Creatinine Clearance: 88.7 ml/min (by C-G formula based on Cr of 1.2). No results found for this basename: VANCOTROUGH, VANCOPEAK, VANCORANDOM, GENTTROUGH, GENTPEAK, GENTRANDOM, TOBRATROUGH, TOBRAPEAK, TOBRARND, AMIKACINPEAK, AMIKACINTROU, AMIKACIN,  in the last 72 hours   Microbiology: No results found for this or any previous visit (from the past 720 hour(s)).  Medical History: Past Medical History  Diagnosis Date  . Pneumothorax     Medications:  Prescriptions prior to admission  Medication Sig Dispense Refill  . Ibuprofen (ADVIL PO) Take 3-4 tablets by mouth once.       Assessment: Pt with fever and B/L axillary furuncles/carbuncles after exposure to hot tub this week. Vanc was starting this morning (6/24) and now adding Zosyn to cover possible pseudomonas. WBC 20.8, Tmax 100.2, no cultures pending at this time.  Goal of Therapy:  Vancomycin trough level 10-15 mcg/ml  Plan:  Vancomycin 1250 mg q12 h. Zosyn 3.375 g q8 h over 4 h. Will f/u renal function, patient's condition, and consider trough in 3-4 days.  Lavonia Dana 04/20/2013,11:29 AM

## 2013-04-20 NOTE — Progress Notes (Signed)
ANTIBIOTIC CONSULT NOTE - INITIAL  Pharmacy Consult for vancomycin  Indication: abscess   No Known Allergies  Patient Measurements: Height: 5\' 10"  (177.8 cm) Weight: 175 lb (79.379 kg) IBW/kg (Calculated) : 73 Adjusted Body Weight:   Vital Signs: Temp: 98.5 F (36.9 C) (06/24 0333) Temp src: Oral (06/24 0333) BP: 122/68 mmHg (06/24 0333) Pulse Rate: 87 (06/24 0333) Intake/Output from previous day: 06/23 0701 - 06/24 0700 In: 120 [P.O.:120] Out: -  Intake/Output from this shift: Total I/O In: 120 [P.O.:120] Out: -   Labs:  Recent Labs  04/19/13 2017 04/19/13 2028  WBC 20.8*  --   HGB 16.2 16.7  PLT 267  --   CREATININE  --  1.20   Estimated Creatinine Clearance: 88.7 ml/min (by C-G formula based on Cr of 1.2). No results found for this basename: VANCOTROUGH, VANCOPEAK, VANCORANDOM, GENTTROUGH, GENTPEAK, GENTRANDOM, TOBRATROUGH, TOBRAPEAK, TOBRARND, AMIKACINPEAK, AMIKACINTROU, AMIKACIN,  in the last 72 hours   Microbiology: No results found for this or any previous visit (from the past 720 hour(s)).  Medical History: Past Medical History  Diagnosis Date  . Pneumothorax     Medications:  Prescriptions prior to admission  Medication Sig Dispense Refill  . Ibuprofen (ADVIL PO) Take 3-4 tablets by mouth once.       Assessment: Pt with fever and axillary abscess after exposure to hot tub this week.   Goal of Therapy:  Vancomycin trough level 10-15 mcg/ml  Plan:  Vancomycin 1250 mg q12 h trough at 4th dose.  Janice Coffin 04/20/2013,5:06 AM

## 2013-04-21 DIAGNOSIS — L732 Hidradenitis suppurativa: Secondary | ICD-10-CM

## 2013-04-21 LAB — BASIC METABOLIC PANEL
BUN: 7 mg/dL (ref 6–23)
CO2: 21 mEq/L (ref 19–32)
Calcium: 8.8 mg/dL (ref 8.4–10.5)
Creatinine, Ser: 0.9 mg/dL (ref 0.50–1.35)
GFR calc non Af Amer: >90 mL/min (ref >90)
Glucose, Bld: 95 mg/dL (ref 70–99)

## 2013-04-21 LAB — CBC
HCT: 41.3 % (ref 39.0–52.0)
Hemoglobin: 15 g/dL (ref 13.0–17.0)
MCH: 33.5 pg (ref 26.0–34.0)
MCHC: 36.3 g/dL — ABNORMAL HIGH (ref 30.0–36.0)
MCV: 92.2 fL (ref 78.0–100.0)
RBC: 4.48 MIL/uL (ref 4.22–5.81)

## 2013-04-21 MED ORDER — OXYCODONE-ACETAMINOPHEN 5-325 MG PO TABS
2.0000 | ORAL_TABLET | Freq: Once | ORAL | Status: AC
  Administered 2013-04-21: 2 via ORAL
  Filled 2013-04-21: qty 2

## 2013-04-21 MED ORDER — OXYCODONE-ACETAMINOPHEN 5-325 MG PO TABS
1.0000 | ORAL_TABLET | ORAL | Status: DC | PRN
  Administered 2013-04-22 (×2): 2 via ORAL
  Filled 2013-04-21 (×2): qty 2

## 2013-04-21 MED ORDER — LIDOCAINE-EPINEPHRINE 2 %-1:100000 IJ SOLN
20.0000 mL | Freq: Once | INTRAMUSCULAR | Status: DC
  Filled 2013-04-21: qty 20

## 2013-04-21 NOTE — Procedures (Signed)
Incision and Drainage Procedure Note  Pre-operative Diagnosis: hidradenitis axillaris   Post-operative Diagnosis: same  Indications: increasing white blood cell count, swelling and pain.  Anesthesia: lidocaine 2% with epinephrine 1:100,000  Procedure Details  The procedure, risks and complications have been discussed in detail (including, but not limited to airway compromise, infection, bleeding) with the patient, and the patient has signed consent to the procedure.  The skin was sterilely prepped and draped over the affected area in the usual fashion. After adequate local anesthesia, I&D with a #11 blade was performed on the left axillary abscess.  Purulent drainage was absent.  The pocket did not communicate with the two superior areas of induration.  An incision was made for adequate drainage.  The wound was observed for hemostasis.The wound was packed with .25inch iodoform.  The patient was observed until stable.  Findings: There are multiple tender, erythematous and non fluctuant nodules to the left axillary region.  The single fluctuant nodule was incised but there was no drainage.  This was packed to allow for adequate drainage.  Cultures were not obtained.   EBL: 10 cc's  Drains: none  Condition: Tolerated procedure well and Stable   Complications: none.

## 2013-04-21 NOTE — Progress Notes (Signed)
[facility] PROGRESS NOTE  Ricky [name] ZOX:[id number] DOB: [birth year] DOA: 04/20/2013 PCP: No PCP Per Patient  Assessment/Plan: Bilateral axillary Hidraadenitis - patient has marginal fever; leukocytosis with left shift  - more significant on the left than right, no significant fluctuant abscess  - continue vancomycin, zosyn - warm compresses  - pain control  - s/p I/D today, await cultures   Code Status: Full Family Communication: Pt in room (indicate person spoken with, relationship, and if by phone, the number) Disposition Plan: Pending   Consultants:  General Surgery  Procedures:  I/d of hidradenitis on 04/21/13  Antibiotics:  Vanco 04/20/13>>>  Zosyn 04/20/13>>>  HPI/Subjective: No major events noted. No complaints.  Objective: Filed Vitals:   04/20/13 1355 04/20/13 2139 04/21/13 0601 04/21/13 1404  BP: 112/59 106/64 105/63 113/71  Pulse: 93 95 82 66  Temp: 99.1 F (37.3 C) 98.6 F (37 C) 98.8 F (37.1 C) 98.4 F (36.9 C)  TempSrc: Oral Oral Oral Oral  Resp: 20 18 17 16   Height:      Weight:      SpO2: 98% 97% 96% 100%   No intake or output data in the 24 hours ending 04/21/13 1429 Filed Weights   04/20/13 0333  Weight: 79.379 kg (175 lb)    Exam:   General:  Awake, in nad  Cardiovascular: regular, s1, s2  Respiratory: normal resp effort, no wheezing  Abdomen: soft, nondistended  Musculoskeletal: perfused, no clubbing   Data Reviewed: Basic Metabolic Panel:  Recent Labs Lab 04/19/13 2028 04/21/13 0625  NA 138 135  K 3.5 3.9  CL 104 104  CO2  --  21  GLUCOSE 93 95  BUN 11 7  CREATININE 1.20 0.90  CALCIUM  --  8.8   Liver Function Tests: No results found for this basename: AST, ALT, ALKPHOS, BILITOT, PROT, ALBUMIN,  in the last 168 hours No results found for this basename: LIPASE, AMYLASE,  in the last 168 hours No results found for this basename: AMMONIA,  in the last 168 hours CBC:  Recent Labs Lab  04/19/13 2017 04/19/13 2028 04/21/13 0625  WBC 20.8*  --  13.4*  NEUTROABS 16.7*  --   --   HGB 16.2 16.7 15.0  HCT 43.8 49.0 41.3  MCV 92.2  --  92.2  PLT 267  --  227   Cardiac Enzymes: No results found for this basename: CKTOTAL, CKMB, CKMBINDEX, TROPONINI,  in the last 168 hours BNP (last 3 results) No results found for this basename: PROBNP,  in the last 8760 hours CBG: No results found for this basename: GLUCAP,  in the last 168 hours  Recent Results (from the past 240 hour(s))  MRSA PCR SCREENING     Status: None   Collection Time    04/20/13 10:17 AM      Result Value Range Status   MRSA by PCR NEGATIVE  NEGATIVE Final   Comment:            The GeneXpert MRSA Assay (FDA     approved for NASAL specimens     only), is one component of a     comprehensive MRSA colonization     surveillance program. It is not     intended to diagnose MRSA     infection nor to guide or     monitor treatment for     MRSA infections.     Studies: No results found.  Scheduled Meds: . lidocaine-EPINEPHrine  20 mL Intradermal  Once  . piperacillin-tazobactam (ZOSYN)  IV  3.375 g Intravenous Q8H  . sodium chloride  3 mL Intravenous Q12H  . vancomycin  1,250 mg Intravenous Q12H   Continuous Infusions:   Principal Problem:   Abscess of axilla, left Active Problems:   Abscess of axilla, right   Leukocytosis, unspecified    Time spent:    Kristy Schomburg K  [facility] Pager [phone]. If 7PM-7AM, please contact night-coverage at [email or web address], password [facility] 04/21/2013, 2:29 PM  LOS: 1 day

## 2013-04-21 NOTE — Progress Notes (Signed)
Subjective: Pt notes his left axilla has worsened and the area is becoming more tender and red.  No fever/chills, but still has leukocytosis of 13.4 down from 20.8.    Objective: Vital signs in last 24 hours: Temp:  [98.6 F (37 C)-99.1 F (37.3 C)] 98.8 F (37.1 C) (06/25 0601) Pulse Rate:  [82-95] 82 (06/25 0601) Resp:  [17-20] 17 (06/25 0601) BP: (105-112)/(59-64) 105/63 mmHg (06/25 0601) SpO2:  [96 %-98 %] 96 % (06/25 0601) Last BM Date: 04/19/13  Intake/Output from previous day: 06/24 0701 - 06/25 0700 In: 240 [P.O.:240] Out: -  Intake/Output this shift:    PE: Gen:  Alert, NAD, pleasant Axilla skin:  Multiple erythematous papules approximately 2-3cm round in b/l axilla R<L, significant induration, but no clear area of fluctuance; no punctum, ulceration or drainage; larger carbuncle in left axilla approximately 4cm x 1cm which is largely indurated and appears to have an area of fluctuance centrally which is worse compared to yesterday.      Lab Results:   Recent Labs  04/19/13 2017 04/19/13 2028 04/21/13 0625  WBC 20.8*  --  13.4*  HGB 16.2 16.7 15.0  HCT 43.8 49.0 41.3  PLT 267  --  227   BMET  Recent Labs  04/19/13 2028 04/21/13 0625  NA 138 135  K 3.5 3.9  CL 104 104  CO2  --  21  GLUCOSE 93 95  BUN 11 7  CREATININE 1.20 0.90  CALCIUM  --  8.8   PT/INR No results found for this basename: LABPROT, INR,  in the last 72 hours CMP     Component Value Date/Time   NA 135 04/21/2013 0625   K 3.9 04/21/2013 0625   CL 104 04/21/2013 0625   CO2 21 04/21/2013 0625   GLUCOSE 95 04/21/2013 0625   BUN 7 04/21/2013 0625   CREATININE 0.90 04/21/2013 0625   CREATININE 0.83 01/29/2012 1314   CALCIUM 8.8 04/21/2013 0625   PROT 7.0 01/29/2012 1314   ALBUMIN 4.6 01/29/2012 1314   AST 25 01/29/2012 1314   ALT 17 01/29/2012 1314   ALKPHOS 55 01/29/2012 1314   BILITOT 0.4 01/29/2012 1314   GFRNONAA >90 04/21/2013 0625   GFRAA >90 04/21/2013 0625   Lipase  No results found  for this basename: lipase       Studies/Results: No results found.  Anti-infectives: Anti-infectives   Start     Dose/Rate Route Frequency Ordered Stop   04/20/13 2000  piperacillin-tazobactam (ZOSYN) IVPB 3.375 g     3.375 g 12.5 mL/hr over 240 Minutes Intravenous 3 times per day 04/20/13 1021     04/20/13 1030  piperacillin-tazobactam (ZOSYN) IVPB 3.375 g     3.375 g 100 mL/hr over 30 Minutes Intravenous NOW 04/20/13 1021 04/20/13 1149   04/20/13 1000  vancomycin (VANCOCIN) 1,250 mg in sodium chloride 0.9 % 250 mL IVPB     1,250 mg 166.7 mL/hr over 90 Minutes Intravenous Every 12 hours 04/20/13 0510     04/20/13 0145  vancomycin (VANCOCIN) IVPB 1000 mg/200 mL premix     1,000 mg 200 mL/hr over 60 Minutes Intravenous  Once 04/20/13 0143 04/20/13 0328       Assessment/Plan B/L axillary furuncles/carbuncles, more significant on the left, left worse today with a small area of fluctuance 1.  Cont IV antibiotics to cover MRSA and pseudomonas, daughter also has similar rash and was also in the hot tub 2.  Plan for I&D of left axilla this afternoon  3.  Patient can likely go home tomorrow if erythema/WBC improving    LOS: 1 day    DORT, Jordany Russett 04/21/2013, 9:30 AM Pager: [phone]

## 2013-04-21 NOTE — Progress Notes (Signed)
I&D at the bedside later today.  Cultures need to be taken.  Continue antibiotics. Marta Lamas. Gae Bon, MD, FACS [phone] [facility]

## 2013-04-22 LAB — CBC
MCH: 33 pg (ref 26.0–34.0)
MCHC: 35.4 g/dL (ref 30.0–36.0)
MCV: 93.1 fL (ref 78.0–100.0)
Platelets: 227 10*3/uL (ref 150–400)
RBC: 4.52 MIL/uL (ref 4.22–5.81)
RDW: 14.1 % (ref 11.5–15.5)

## 2013-04-22 MED ORDER — CLINDAMYCIN PHOSPHATE 1 % EX GEL: Freq: Two times a day (BID) | CUTANEOUS | Status: DC

## 2013-04-22 MED ORDER — OXYCODONE-ACETAMINOPHEN 5-325 MG PO TABS: 1.0000 | ORAL_TABLET | ORAL | Status: DC | PRN

## 2013-04-22 MED ORDER — CIPROFLOXACIN HCL 500 MG PO TABS: 500.0000 mg | ORAL_TABLET | Freq: Two times a day (BID) | ORAL | Status: DC

## 2013-04-22 MED ORDER — DOXYCYCLINE HYCLATE 50 MG PO CAPS: 50.0000 mg | ORAL_CAPSULE | Freq: Two times a day (BID) | ORAL | Status: DC

## 2013-04-22 NOTE — Discharge Summary (Addendum)
Physician Discharge Summary  Ricky [name] ZOX:[id number] DOB: Jul 22, 1976 DOA: 04/20/2013  PCP: No PCP Per Patient  Admit date: 04/20/2013 Discharge date: 04/22/2013  Time spent: 30 minutes  Recommendations for Outpatient Follow-up:  1. Establish and follow up with a PCP within 2 weeks  Discharge Diagnoses:  Principal Problem:   Abscess of axilla, left Active Problems:   Abscess of axilla, right   Leukocytosis, unspecified   Discharge Condition: improved  Diet recommendation: Regular  Filed Weights   04/20/13 0333  Weight: 79.379 kg (175 lb)    History of present illness:  Ricky [name] is a 37 y.o. male who woke up today not feeling well, and noticed a small bump in his L axilla. By the afternoon he had large painful areas in B axillary regions with fever and chills. No trauma, no shaving, no h/o same in past, no cat scratch, was in a hot tub over the weekend but denies any other known exposure or any other location of skin rash. Symptoms are mild to moderate in severity.  In the ED he was found to have a WBC of 20.8k and temperature of 99.2. He was started on vancomycin, the areas of fluctuance wernt really organized enough yet at this point to drain per EDP, hospitalist has been asked to admit.  Hospital Course:  The patient was started on empiric vanc and zosyn with resolution of his leukocytosis. General surgery was consulted and the patient did undergo an attempted incision and drainage of the lesion. No purulence was expressed. The patient remained afebrile through the hospital course and remained medically stable.  Procedures: I/d of hidradenitis on 04/21/13   Consultations:  General Surgery  Discharge Exam: Filed Vitals:   04/21/13 2137 04/21/13 2140 04/22/13 0218 04/22/13 1345  BP: 117/77 117/77 103/58 110/62  Pulse: 70 70 68 70  Temp: 98.6 F (37 C) 98.6 F (37 C) 98.2 F (36.8 C) 98.4 F (36.9 C)  TempSrc:  Oral  Oral  Resp: 18 19 18 18   Height:       Weight:      SpO2: 99% 99% 100% 100%    General: Awake,in nad Cardiovascular: regular, s1, s2 Respiratory: normal resp effort, no wheezing  Discharge Instructions       Future Appointments Provider Department Dept Phone   05/11/2013 1:15 PM [facility], [place] [phone]       Medication List    TAKE these medications       ADVIL PO  Take 3-4 tablets by mouth once.     ciprofloxacin 500 MG tablet  Commonly known as:  CIPRO  Take 1 tablet (500 mg total) by mouth 2 (two) times daily.     clindamycin 1 % gel  Commonly known as:  CLINDAGEL  Apply topically 2 (two) times daily.     doxycycline 50 MG capsule  Commonly known as:  VIBRAMYCIN  Take 1 capsule (50 mg total) by mouth 2 (two) times daily.     oxyCODONE-acetaminophen 5-325 MG per tablet  Commonly known as:  PERCOCET/ROXICET  Take 1-2 tablets by mouth every 4 (four) hours as needed.       No Known Allergies Follow-up Information   Follow up with Establish and follow up with a PCP In 2 weeks.      Follow up with [facility] [place] On 05/11/2013. (APPT AT 11:30AM, PLEASE ARRIVE AT 11:300AM (YOUR APPT MAY SAY 1:15PM, BUT PLEASE COME  AT 11:30 INSTEAD)    Contact information:   [address]   [place] [address] [phone]        The results of significant diagnostics from this hospitalization (including imaging, microbiology, ancillary and laboratory) are listed below for reference.    Significant Diagnostic Studies: No results found.  Microbiology: Recent Results (from the past 240 hour(s))  MRSA PCR SCREENING     Status: None   Collection Time    04/20/13 10:17 AM      Result Value Range Status   MRSA by PCR NEGATIVE  NEGATIVE Final   Comment:            The GeneXpert MRSA Assay (FDA     approved for NASAL specimens     only), is one component of a     comprehensive MRSA colonization     surveillance program. It is not     intended to  diagnose MRSA     infection nor to guide or     monitor treatment for     MRSA infections.     Labs: Basic Metabolic Panel:  Recent Labs Lab 04/19/13 2028 04/21/13 0625  NA 138 135  K 3.5 3.9  CL 104 104  CO2  --  21  GLUCOSE 93 95  BUN 11 7  CREATININE 1.20 0.90  CALCIUM  --  8.8   Liver Function Tests: No results found for this basename: AST, ALT, ALKPHOS, BILITOT, PROT, ALBUMIN,  in the last 168 hours No results found for this basename: LIPASE, AMYLASE,  in the last 168 hours No results found for this basename: AMMONIA,  in the last 168 hours CBC:  Recent Labs Lab 04/19/13 2017 04/19/13 2028 04/21/13 0625 04/22/13 0645  WBC 20.8*  --  13.4* 9.2  NEUTROABS 16.7*  --   --   --   HGB 16.2 16.7 15.0 14.9  HCT 43.8 49.0 41.3 42.1  MCV 92.2  --  92.2 93.1  PLT 267  --  227 227   Cardiac Enzymes: No results found for this basename: CKTOTAL, CKMB, CKMBINDEX, TROPONINI,  in the last 168 hours BNP: BNP (last 3 results) No results found for this basename: PROBNP,  in the last 8760 hours CBG: No results found for this basename: GLUCAP,  in the last 168 hours     Signed:  CHIU, STEPHEN K  [facility] 04/22/2013, 2:23 PM

## 2013-04-22 NOTE — Progress Notes (Signed)
Discharge instructions reviewed with pt and prescriptions given.  Also demonstrated dressing change.  Pt verbalized understanding and questions answered.  Pt discharged in stable condition with family.  Hector Shade [place]

## 2013-04-22 NOTE — Progress Notes (Signed)
Subjective: 37 yo white male, comfortably sitting up in bed with heating pads under bilateral axilla. States underarms are still tender but has improved. States the bumps under his arms have decreased in size. Denies any fevers, chills, or other skin rashes/irritations.   Objective: Vital signs in last 24 hours: Temp:  [98.2 F (36.8 C)-98.6 F (37 C)] 98.2 F (36.8 C) (06/26 0218) Pulse Rate:  [66-70] 68 (06/26 0218) Resp:  [16-19] 18 (06/26 0218) BP: (103-117)/(58-77) 103/58 mmHg (06/26 0218) SpO2:  [99 %-100 %] 100 % (06/26 0218) Last BM Date: 04/19/13  Intake/Output from previous day:   Intake/Output this shift:    PE: General:  Alert, NAD, pleasant, comfortable Cardiac: RRR, No m/r/g Pulmonary: CTA, no w/r/r Abd:  Soft, ND/NT, no abdominal scars or rashes noted Skin: Multiple erythematous papules approximately 2-3cm round in b/l axilla R<L, significant induration, but no clear area of fluctuance. 1 cm packed open incision in left axilla   Lab Results:   Recent Labs  04/21/13 0625 04/22/13 0645  WBC 13.4* 9.2  HGB 15.0 14.9  HCT 41.3 42.1  PLT 227 227   BMET  Recent Labs  04/19/13 2028 04/21/13 0625  NA 138 135  K 3.5 3.9  CL 104 104  CO2  --  21  GLUCOSE 93 95  BUN 11 7  CREATININE 1.20 0.90  CALCIUM  --  8.8   PT/INR No results found for this basename: LABPROT, INR,  in the last 72 hours CMP     Component Value Date/Time   NA 135 04/21/2013 0625   K 3.9 04/21/2013 0625   CL 104 04/21/2013 0625   CO2 21 04/21/2013 0625   GLUCOSE 95 04/21/2013 0625   BUN 7 04/21/2013 0625   CREATININE 0.90 04/21/2013 0625   CREATININE 0.83 01/29/2012 1314   CALCIUM 8.8 04/21/2013 0625   PROT 7.0 01/29/2012 1314   ALBUMIN 4.6 01/29/2012 1314   AST 25 01/29/2012 1314   ALT 17 01/29/2012 1314   ALKPHOS 55 01/29/2012 1314   BILITOT 0.4 01/29/2012 1314   GFRNONAA >90 04/21/2013 0625   GFRAA >90 04/21/2013 0625   Lipase  No results found for this basename: lipase        Studies/Results: No results found.  Anti-infectives: Anti-infectives   Start     Dose/Rate Route Frequency Ordered Stop   04/22/13 0000  doxycycline (VIBRAMYCIN) 50 MG capsule     50 mg Oral 2 times daily 04/22/13 0951     04/20/13 2000  piperacillin-tazobactam (ZOSYN) IVPB 3.375 g     3.375 g 12.5 mL/hr over 240 Minutes Intravenous 3 times per day 04/20/13 1021     04/20/13 1030  piperacillin-tazobactam (ZOSYN) IVPB 3.375 g     3.375 g 100 mL/hr over 30 Minutes Intravenous NOW 04/20/13 1021 04/20/13 1149   04/20/13 1000  vancomycin (VANCOCIN) 1,250 mg in sodium chloride 0.9 % 250 mL IVPB     1,250 mg 166.7 mL/hr over 90 Minutes Intravenous Every 12 hours 04/20/13 0510     04/20/13 0145  vancomycin (VANCOCIN) IVPB 1000 mg/200 mL premix     1,000 mg 200 mL/hr over 60 Minutes Intravenous  Once 04/20/13 0143 04/20/13 0328       Assessment/Plan Bilateral axillary Hidradenitis, left > right, s/p I&D left axilla (04/21/13)  - Rec antibiotics that cover pseudomonas and Staph Aureus for additional                2  weeks   -  Pack wound until follow up  - Continue heating pads underneath axilla  - Keep underarms covered and clean. Informed pt of potential to pass                 infection with skin-skin contact  - Wash all linens and clothes with bleach if possible   - Rec Hibiclins scrub once sores are healed for prevention  - Avoid swimming pools and hot tubs  - f/u appointment with [facility] on 05/11/13 at 11:30 AM or pt to f/u with PCP                 sooner if able       LOS: 2 days    Cephus Shelling, PA-S  04/22/2013, 1:29 PM

## 2013-04-28 ENCOUNTER — Encounter (INDEPENDENT_AMBULATORY_CARE_PROVIDER_SITE_OTHER): Payer: Self-pay | Admitting: General Surgery

## 2013-04-28 ENCOUNTER — Ambulatory Visit (INDEPENDENT_AMBULATORY_CARE_PROVIDER_SITE_OTHER): Payer: BC Managed Care – PPO | Admitting: General Surgery

## 2013-04-28 VITALS — BP 130/84 | HR 71 | Temp 97.8°F | Resp 16 | Ht 70.0 in | Wt 170.8 lb

## 2013-04-28 DIAGNOSIS — IMO0002 Reserved for concepts with insufficient information to code with codable children: Secondary | ICD-10-CM

## 2013-04-28 DIAGNOSIS — L02419 Cutaneous abscess of limb, unspecified: Secondary | ICD-10-CM

## 2013-04-28 NOTE — Progress Notes (Signed)
Patient ID: Ricky [name], male   DOB: 10-Dec-1975, 37 y.o.   MRN: [id number]  No chief complaint on file.   HPI Ricky [name] is a 37 y.o. male.   HPI follow up from ER visit and I/D of left axillary skin abscess 1 week ago.  He completed course of doxycycline and was then placed on cipro.  He has been doing daily wet to dry dressing changes and the wound feels better but still sore.  No fevers or chills.  Redness better.  Past Medical History  Diagnosis Date  . Pneumothorax     No past surgical history on file.  Family History  Problem Relation Age of Onset  . Heart attack Other   . Stroke Maternal Aunt     Social History History  Substance Use Topics  . Smoking status: Current Every Day Smoker -- 0.50 packs/day    Types: Cigarettes  . Smokeless tobacco: Not on file  . Alcohol Use: No    No Known Allergies  Current Outpatient Prescriptions  Medication Sig Dispense Refill  . ciprofloxacin (CIPRO) 500 MG tablet Take 1 tablet (500 mg total) by mouth 2 (two) times daily.  20 tablet  0  . clindamycin (CLINDAGEL) 1 % gel Apply topically 2 (two) times daily.  30 g  0  . clindamycin (CLINDAGEL) 1 % gel Apply topically 2 (two) times daily.      . Hydrocodone-Acetaminophen (VICODIN) 5-300 MG TABS 1 tablet. Take 1 tablet by mouth every 6 (six) hours as needed. Limit use      . doxycycline (VIBRAMYCIN) 50 MG capsule Take 1 capsule (50 mg total) by mouth 2 (two) times daily.  10 capsule  0  . doxycycline (VIBRAMYCIN) 50 MG capsule 50 mg. Take 50 mg by mouth 2 (two) times daily.      . Ibuprofen (ADVIL PO) Take 3-4 tablets by mouth once.      Marland Kitchen oxyCODONE-acetaminophen (PERCOCET/ROXICET) 5-325 MG per tablet Take 1-2 tablets by mouth every 4 (four) hours as needed.  30 tablet  0   No current facility-administered medications for this visit.    Review of Systems Review of Systems All other review of systems negative or noncontributory except as stated in the HPI  Blood  pressure 130/84, pulse 71, temperature 97.8 F (36.6 C), temperature source Temporal, resp. rate 16, height 5\' 10"  (1.778 m), weight 170 lb 12.8 oz (77.474 kg).  Physical Exam Physical Exam  Vitals reviewed. Constitutional: He is oriented to person, place, and time. He appears well-developed and well-nourished. No distress.  HENT:  Head: Normocephalic and atraumatic.  Eyes: Conjunctivae are normal. Pupils are equal, round, and reactive to light.  Neck: Normal range of motion. Neck supple.  Cardiovascular: Normal rate.   Pulmonary/Chest: Effort normal. No respiratory distress.  Abdominal: Soft. He exhibits no distension.  Musculoskeletal: Normal range of motion.  Neurological: He is alert and oriented to person, place, and time.  Skin: Skin is warm and dry. He is not diaphoretic.  Left axillary wound about 1.5 cm x 5mm with healthy granulation tissue.  No erythema, no fluctuance.  There is another area just above the area of I/D which seems to be resolving and I do not appreciate any fluctuance or need for I/D of this area today.    Data Reviewed  Assessment    Left axillary abscess This appears to be adequately drained. I changed the dressing today and he has healthy granulation tissue in the area. There is  no surrounding erythema or fluctuance. My only concern is the area just above this site of I&D where he had a second area of erythema initially. He did not have any erythema or fluctuance today but he appears to have some resolving induration and I do not think that this needs incision and drainage today I would like to see him back in about one week after he has completed antibiotics to ensure that this does not need incision and drainage as well. In the meantime, I recommended that he continue with his current wound care     Plan    Continue current wound care and finished antibiotics and I will see him back in about one week        Arlett Goold DAVID 04/28/2013, 3:20  PM

## 2013-05-06 ENCOUNTER — Ambulatory Visit (INDEPENDENT_AMBULATORY_CARE_PROVIDER_SITE_OTHER): Payer: BC Managed Care – PPO | Admitting: General Surgery

## 2013-05-06 ENCOUNTER — Encounter (INDEPENDENT_AMBULATORY_CARE_PROVIDER_SITE_OTHER): Payer: Self-pay | Admitting: General Surgery

## 2013-05-06 ENCOUNTER — Encounter (INDEPENDENT_AMBULATORY_CARE_PROVIDER_SITE_OTHER): Payer: BC Managed Care – PPO | Admitting: General Surgery

## 2013-05-06 VITALS — BP 112/80 | HR 76 | Temp 98.7°F | Resp 16 | Ht 70.0 in | Wt 173.4 lb

## 2013-05-06 DIAGNOSIS — L0291 Cutaneous abscess, unspecified: Secondary | ICD-10-CM

## 2013-05-06 DIAGNOSIS — L0390 Cellulitis, unspecified: Secondary | ICD-10-CM

## 2013-05-06 NOTE — Progress Notes (Signed)
Subjective:     Patient ID: Ricky [name], male   DOB: 1976-05-12, 37 y.o.   MRN: [id number]  HPI This patient follows up status post incision and drainage of left axillary skin abscess by the emergency room. He has been packing the wound and has completed his antibiotic treatment for about 5 days now. He denies any pain in the area or fevers or chills. His only complaint is some occasional dizziness  Review of Systems     Objective:   Physical Exam No acute distress and nontoxic-appearing sitting up comfortably in a chair His left axillary wound is almost completely healed and has a very shallow wound which I do not think should require anymore packing. The area of concern just above his prior I&D site is nontender and I do not appreciate any cyst or abscess or infection in this area    Assessment:     History of left axillary abscess-resolving His wound is healing very nicely without any sign of residual infection. The area of concern just above his I&D site is normal appearing without any sign of abscess or infection. I do not think that he needs to pack the wound any further but I did recommend that he continue using the topical clindamycin until this is completely healed. I did discuss with him the possibility of elective excision of if there is any residual cyst in the area of after this is completely healed. He will call her back if he would like to have this removed electively or if this continues to bother him.  With regard to the dizziness, I am not certain how that would be related to his current problem. He is no longer taking the pain medication IV antibiotics and I really am not certain what could be causing this. His blood pressure and heart rate are normal his heart rate is regular and I recommended that he followup with his primary care physician for further evaluation of this    Plan:     Continue wound care and plan for elective excision if this continues to cause any  problems.  F/u primary care physician for evaluation of the dizziness.

## 2013-05-11 ENCOUNTER — Encounter (INDEPENDENT_AMBULATORY_CARE_PROVIDER_SITE_OTHER): Payer: BC Managed Care – PPO

## 2016-04-27 DIAGNOSIS — I214 Non-ST elevation (NSTEMI) myocardial infarction: Secondary | ICD-10-CM

## 2016-04-27 HISTORY — DX: Non-ST elevation (NSTEMI) myocardial infarction: I21.4

## 2016-05-01 ENCOUNTER — Inpatient Hospital Stay (HOSPITAL_COMMUNITY)
Admission: EM | Admit: 2016-05-01 | Discharge: 2016-05-03 | DRG: 247 | Disposition: A | Discharge status: Home / Self Care | Payer: Self-pay | Attending: Internal Medicine | Admitting: Internal Medicine

## 2016-05-01 ENCOUNTER — Encounter (HOSPITAL_COMMUNITY): Payer: Self-pay | Admitting: Emergency Medicine

## 2016-05-01 ENCOUNTER — Emergency Department (HOSPITAL_COMMUNITY): Payer: Self-pay

## 2016-05-01 DIAGNOSIS — Z720 Tobacco use: Secondary | ICD-10-CM | POA: Diagnosis present

## 2016-05-01 DIAGNOSIS — R778 Other specified abnormalities of plasma proteins: Secondary | ICD-10-CM | POA: Diagnosis present

## 2016-05-01 DIAGNOSIS — I214 Non-ST elevation (NSTEMI) myocardial infarction: Principal | ICD-10-CM | POA: Diagnosis present

## 2016-05-01 DIAGNOSIS — F17210 Nicotine dependence, cigarettes, uncomplicated: Secondary | ICD-10-CM | POA: Diagnosis present

## 2016-05-01 DIAGNOSIS — Z8249 Family history of ischemic heart disease and other diseases of the circulatory system: Secondary | ICD-10-CM

## 2016-05-01 DIAGNOSIS — E785 Hyperlipidemia, unspecified: Secondary | ICD-10-CM | POA: Diagnosis present

## 2016-05-01 DIAGNOSIS — R7989 Other specified abnormal findings of blood chemistry: Secondary | ICD-10-CM | POA: Diagnosis present

## 2016-05-01 DIAGNOSIS — I25110 Atherosclerotic heart disease of native coronary artery with unstable angina pectoris: Secondary | ICD-10-CM | POA: Diagnosis present

## 2016-05-01 DIAGNOSIS — L723 Sebaceous cyst: Secondary | ICD-10-CM

## 2016-05-01 DIAGNOSIS — R079 Chest pain, unspecified: Secondary | ICD-10-CM | POA: Diagnosis present

## 2016-05-01 DIAGNOSIS — Z955 Presence of coronary angioplasty implant and graft: Secondary | ICD-10-CM

## 2016-05-01 HISTORY — DX: Headache: R51

## 2016-05-01 HISTORY — DX: Headache, unspecified: R51.9

## 2016-05-01 NOTE — ED Notes (Signed)
Pt. reports intermittent central chest pain " sharp" onset 2 weeks ago with mild SOB radiating to bilateral axilla , nausea and emesis and fingers tingling , denies diaphoresis .

## 2016-05-02 ENCOUNTER — Encounter (HOSPITAL_COMMUNITY)
Admission: EM | Disposition: A | Discharge status: Home / Self Care | Payer: Self-pay | Source: Home / Self Care | Attending: Cardiovascular Disease

## 2016-05-02 ENCOUNTER — Observation Stay (HOSPITAL_BASED_OUTPATIENT_CLINIC_OR_DEPARTMENT_OTHER): Payer: Self-pay

## 2016-05-02 ENCOUNTER — Encounter (HOSPITAL_COMMUNITY): Payer: Self-pay | Admitting: Family Medicine

## 2016-05-02 DIAGNOSIS — R079 Chest pain, unspecified: Secondary | ICD-10-CM | POA: Diagnosis present

## 2016-05-02 DIAGNOSIS — I2510 Atherosclerotic heart disease of native coronary artery without angina pectoris: Secondary | ICD-10-CM

## 2016-05-02 DIAGNOSIS — R7989 Other specified abnormal findings of blood chemistry: Secondary | ICD-10-CM | POA: Diagnosis present

## 2016-05-02 DIAGNOSIS — R778 Other specified abnormalities of plasma proteins: Secondary | ICD-10-CM | POA: Diagnosis present

## 2016-05-02 DIAGNOSIS — I214 Non-ST elevation (NSTEMI) myocardial infarction: Secondary | ICD-10-CM | POA: Diagnosis present

## 2016-05-02 HISTORY — PX: CARDIAC CATHETERIZATION: SHX172

## 2016-05-02 LAB — TROPONIN I
Troponin I: 0.12 ng/mL (ref <0.03)
Troponin I: 0.2 ng/mL (ref <0.03)
Troponin I: 0.29 ng/mL (ref <0.03)
Troponin I: 0.32 ng/mL (ref <0.03)

## 2016-05-02 LAB — ECHOCARDIOGRAM COMPLETE
AVLVOTPG: 3 mmHg
E decel time: 335 msec
FS: 35 % (ref 28–44)
HEIGHTINCHES: 70 in
IVS/LV PW RATIO, ED: 1
LA diam end sys: 35 mm
LA diam index: 1.82 cm/m2
LA vol index: 26.8 mL/m2
LASIZE: 35 mm
LAVOL: 51.4 mL
LAVOLA4C: 46.7 mL
LDCA: 4.52 cm2
LV e' LATERAL: 9.14 cm/s
LVOT SV: 79 mL
LVOT VTI: 17.4 cm
LVOT diameter: 24 mm
LVOT peak vel: 79.8 cm/s
MV Dec: 335
MV pk E vel: 1.9 m/s
PW: 10 mm — AB (ref 0.6–1.1)
RV TAPSE: 23.3 mm
TDI e' lateral: 9.14
TDI e' medial: 9.14
WEIGHTICAEL: 2633.6 [oz_av]

## 2016-05-02 LAB — BASIC METABOLIC PANEL
ANION GAP: 6 (ref 5–15)
BUN: 8 mg/dL (ref 6–20)
CO2: 24 mmol/L (ref 22–32)
Calcium: 9.4 mg/dL (ref 8.9–10.3)
Chloride: 107 mmol/L (ref 101–111)
Creatinine, Ser: 0.92 mg/dL (ref 0.61–1.24)
GFR calc Af Amer: >60 mL/min (ref >60)
GFR calc non Af Amer: >60 mL/min (ref >60)
GLUCOSE: 88 mg/dL (ref 65–99)
POTASSIUM: 3.7 mmol/L (ref 3.5–5.1)
Sodium: 137 mmol/L (ref 135–145)

## 2016-05-02 LAB — CBC
HEMATOCRIT: 46.3 % (ref 39.0–52.0)
HEMOGLOBIN: 16.2 g/dL (ref 13.0–17.0)
MCH: 33.3 pg (ref 26.0–34.0)
MCHC: 35 g/dL (ref 30.0–36.0)
MCV: 95.3 fL (ref 78.0–100.0)
Platelets: 278 10*3/uL (ref 150–400)
RBC: 4.86 MIL/uL (ref 4.22–5.81)
RDW: 14.4 % (ref 11.5–15.5)
WBC: 7.2 10*3/uL (ref 4.0–10.5)

## 2016-05-02 LAB — LIPID PANEL
CHOL/HDL RATIO: 6.2 ratio
CHOLESTEROL: 205 mg/dL — AB (ref 0–200)
HDL: 33 mg/dL — ABNORMAL LOW (ref >40)
LDL CALC: 134 mg/dL — AB (ref 0–99)
TRIGLYCERIDES: 188 mg/dL — AB (ref <150)
VLDL: 38 mg/dL (ref 0–40)

## 2016-05-02 LAB — POCT ACTIVATED CLOTTING TIME: ACTIVATED CLOTTING TIME: 312 s

## 2016-05-02 LAB — I-STAT TROPONIN, ED: Troponin i, poc: 0.12 ng/mL (ref 0.00–0.08)

## 2016-05-02 SURGERY — LEFT HEART CATH AND CORONARY ANGIOGRAPHY
Anesthesia: LOCAL

## 2016-05-02 MED ORDER — ENOXAPARIN SODIUM 40 MG/0.4ML ~~LOC~~ SOLN: 40.0000 mg | SUBCUTANEOUS | Status: DC

## 2016-05-02 MED ORDER — SODIUM CHLORIDE 0.9 % IV SOLN
INTRAVENOUS | Status: DC | PRN
  Administered 2016-05-02: 76 mL via INTRAVENOUS

## 2016-05-02 MED ORDER — HEART ATTACK BOUNCING BOOK
Freq: Once | Status: AC
  Administered 2016-05-02: 21:00:00
  Filled 2016-05-02: qty 1

## 2016-05-02 MED ORDER — TIROFIBAN HCL IN NACL 5-0.9 MG/100ML-% IV SOLN
INTRAVENOUS | Status: AC
  Filled 2016-05-02: qty 100

## 2016-05-02 MED ORDER — LIDOCAINE HCL (PF) 1 % IJ SOLN
INTRAMUSCULAR | Status: AC
  Filled 2016-05-02: qty 30

## 2016-05-02 MED ORDER — SODIUM CHLORIDE 0.9 % WEIGHT BASED INFUSION: 3.0000 mL/kg/h | INTRAVENOUS | Status: DC

## 2016-05-02 MED ORDER — ASPIRIN 81 MG PO CHEW: 81.0000 mg | CHEWABLE_TABLET | ORAL | Status: DC

## 2016-05-02 MED ORDER — HEPARIN (PORCINE) IN NACL 100-0.45 UNIT/ML-% IJ SOLN
900.0000 [IU]/h | INTRAMUSCULAR | Status: DC
  Administered 2016-05-02: 900 [IU]/h via INTRAVENOUS
  Filled 2016-05-02: qty 250

## 2016-05-02 MED ORDER — LIDOCAINE HCL (PF) 1 % IJ SOLN
INTRAMUSCULAR | Status: DC | PRN
  Administered 2016-05-02: 3 mL

## 2016-05-02 MED ORDER — TICAGRELOR 90 MG PO TABS
ORAL_TABLET | ORAL | Status: DC | PRN
  Administered 2016-05-02: 180 mg via ORAL

## 2016-05-02 MED ORDER — MIDAZOLAM HCL 2 MG/2ML IJ SOLN
INTRAMUSCULAR | Status: AC
  Filled 2016-05-02: qty 2

## 2016-05-02 MED ORDER — VERAPAMIL HCL 2.5 MG/ML IV SOLN
INTRAVENOUS | Status: DC | PRN
  Administered 2016-05-02: 10 mL via INTRA_ARTERIAL

## 2016-05-02 MED ORDER — THE SENSUOUS HEART BOOK
Freq: Once | Status: AC
  Administered 2016-05-02: 21:00:00
  Filled 2016-05-02: qty 1

## 2016-05-02 MED ORDER — ASPIRIN 81 MG PO CHEW
324.0000 mg | CHEWABLE_TABLET | Freq: Once | ORAL | Status: AC
  Administered 2016-05-02: 324 mg via ORAL
  Filled 2016-05-02: qty 4

## 2016-05-02 MED ORDER — TIROFIBAN (AGGRASTAT) BOLUS VIA INFUSION
INTRAVENOUS | Status: DC | PRN
  Administered 2016-05-02: 1867.5 ug via INTRAVENOUS

## 2016-05-02 MED ORDER — ONDANSETRON HCL 4 MG/2ML IJ SOLN: 4.0000 mg | Freq: Four times a day (QID) | INTRAMUSCULAR | Status: DC | PRN

## 2016-05-02 MED ORDER — HEPARIN SODIUM (PORCINE) 1000 UNIT/ML IJ SOLN
INTRAMUSCULAR | Status: AC
  Filled 2016-05-02: qty 1

## 2016-05-02 MED ORDER — TICAGRELOR 90 MG PO TABS
ORAL_TABLET | ORAL | Status: AC
  Filled 2016-05-02: qty 2

## 2016-05-02 MED ORDER — NICOTINE 14 MG/24HR TD PT24
14.0000 mg | MEDICATED_PATCH | Freq: Every day | TRANSDERMAL | Status: DC
  Administered 2016-05-02: 14 mg via TRANSDERMAL
  Filled 2016-05-02: qty 1

## 2016-05-02 MED ORDER — NITROGLYCERIN 0.4 MG SL SUBL
0.4000 mg | SUBLINGUAL_TABLET | SUBLINGUAL | Status: DC | PRN
  Administered 2016-05-02: 0.4 mg via SUBLINGUAL
  Filled 2016-05-02: qty 1

## 2016-05-02 MED ORDER — TIROFIBAN HCL IN NACL 5-0.9 MG/100ML-% IV SOLN
0.1500 ug/kg/min | INTRAVENOUS | Status: AC
  Filled 2016-05-02: qty 100

## 2016-05-02 MED ORDER — SODIUM CHLORIDE 0.9 % WEIGHT BASED INFUSION: 3.0000 mL/kg/h | INTRAVENOUS | Status: AC

## 2016-05-02 MED ORDER — ASPIRIN 81 MG PO CHEW
81.0000 mg | CHEWABLE_TABLET | Freq: Every day | ORAL | Status: DC
Start: 2016-05-03 — End: 2016-05-03
  Administered 2016-05-03: 81 mg via ORAL
  Filled 2016-05-02: qty 1

## 2016-05-02 MED ORDER — NITROGLYCERIN 1 MG/10 ML FOR IR/CATH LAB
INTRA_ARTERIAL | Status: AC
  Filled 2016-05-02: qty 10

## 2016-05-02 MED ORDER — ASPIRIN 81 MG PO CHEW
CHEWABLE_TABLET | ORAL | Status: AC
  Administered 2016-05-02: 13:00:00
  Filled 2016-05-02: qty 1

## 2016-05-02 MED ORDER — SODIUM CHLORIDE 0.9% FLUSH: 3.0000 mL | INTRAVENOUS | Status: DC | PRN

## 2016-05-02 MED ORDER — TIROFIBAN HCL IN NACL 5-0.9 MG/100ML-% IV SOLN
INTRAVENOUS | Status: DC | PRN
  Administered 2016-05-02: 0.15 ug/kg/min via INTRAVENOUS

## 2016-05-02 MED ORDER — SODIUM CHLORIDE 0.9 % WEIGHT BASED INFUSION: 1.0000 mL/kg/h | INTRAVENOUS | Status: DC

## 2016-05-02 MED ORDER — ATORVASTATIN CALCIUM 80 MG PO TABS
80.0000 mg | ORAL_TABLET | Freq: Every day | ORAL | Status: DC
  Administered 2016-05-02: 80 mg via ORAL
  Filled 2016-05-02: qty 1

## 2016-05-02 MED ORDER — IOPAMIDOL (ISOVUE-370) INJECTION 76%
INTRAVENOUS | Status: DC | PRN
  Administered 2016-05-02: 125 mL

## 2016-05-02 MED ORDER — ACETAMINOPHEN 325 MG PO TABS
650.0000 mg | ORAL_TABLET | ORAL | Status: DC | PRN
  Administered 2016-05-02: 650 mg via ORAL

## 2016-05-02 MED ORDER — GI COCKTAIL ~~LOC~~
30.0000 mL | Freq: Four times a day (QID) | ORAL | Status: DC | PRN
  Administered 2016-05-02: 30 mL via ORAL
  Filled 2016-05-02: qty 30

## 2016-05-02 MED ORDER — HEPARIN (PORCINE) IN NACL 2-0.9 UNIT/ML-% IJ SOLN
INTRAMUSCULAR | Status: DC | PRN
  Administered 2016-05-02: 15:00:00

## 2016-05-02 MED ORDER — SODIUM CHLORIDE 0.9% FLUSH
3.0000 mL | Freq: Two times a day (BID) | INTRAVENOUS | Status: DC
  Administered 2016-05-02: 3 mL via INTRAVENOUS

## 2016-05-02 MED ORDER — VERAPAMIL HCL 2.5 MG/ML IV SOLN
INTRAVENOUS | Status: AC
  Filled 2016-05-02: qty 2

## 2016-05-02 MED ORDER — NITROGLYCERIN 1 MG/10 ML FOR IR/CATH LAB
INTRA_ARTERIAL | Status: DC | PRN
  Administered 2016-05-02: 200 ug via INTRACORONARY

## 2016-05-02 MED ORDER — ACETAMINOPHEN 325 MG PO TABS
650.0000 mg | ORAL_TABLET | ORAL | Status: DC | PRN
  Filled 2016-05-02: qty 2

## 2016-05-02 MED ORDER — FENTANYL CITRATE (PF) 100 MCG/2ML IJ SOLN
INTRAMUSCULAR | Status: AC
  Filled 2016-05-02: qty 2

## 2016-05-02 MED ORDER — SODIUM CHLORIDE 0.9% FLUSH: 3.0000 mL | Freq: Two times a day (BID) | INTRAVENOUS | Status: DC

## 2016-05-02 MED ORDER — TICAGRELOR 90 MG PO TABS
90.0000 mg | ORAL_TABLET | Freq: Two times a day (BID) | ORAL | Status: DC
  Administered 2016-05-03 (×2): 90 mg via ORAL
  Filled 2016-05-02 (×2): qty 1

## 2016-05-02 MED ORDER — HEPARIN SODIUM (PORCINE) 1000 UNIT/ML IJ SOLN
INTRAMUSCULAR | Status: DC | PRN
  Administered 2016-05-02: 3500 [IU] via INTRAVENOUS
  Administered 2016-05-02: 5000 [IU] via INTRAVENOUS

## 2016-05-02 MED ORDER — ANGIOPLASTY BOOK
Freq: Once | Status: AC
  Administered 2016-05-02: 21:00:00
  Filled 2016-05-02: qty 1

## 2016-05-02 MED ORDER — SODIUM CHLORIDE 0.9 % IV SOLN: 250.0000 mL | INTRAVENOUS | Status: DC | PRN

## 2016-05-02 MED ORDER — HEPARIN BOLUS VIA INFUSION
4000.0000 [IU] | Freq: Once | INTRAVENOUS | Status: AC
  Administered 2016-05-02: 4000 [IU] via INTRAVENOUS
  Filled 2016-05-02: qty 4000

## 2016-05-02 MED ORDER — HEPARIN (PORCINE) IN NACL 2-0.9 UNIT/ML-% IJ SOLN
INTRAMUSCULAR | Status: AC
  Filled 2016-05-02: qty 1000

## 2016-05-02 MED ORDER — FENTANYL CITRATE (PF) 100 MCG/2ML IJ SOLN
INTRAMUSCULAR | Status: DC | PRN
  Administered 2016-05-02: 50 ug via INTRAVENOUS
  Administered 2016-05-02 (×2): 25 ug via INTRAVENOUS

## 2016-05-02 MED ORDER — MIDAZOLAM HCL 2 MG/2ML IJ SOLN
INTRAMUSCULAR | Status: DC | PRN
  Administered 2016-05-02: 2 mg via INTRAVENOUS
  Administered 2016-05-02 (×2): 1 mg via INTRAVENOUS

## 2016-05-02 MED ORDER — ACTIVE PARTNERSHIP FOR HEALTH OF YOUR HEART BOOK
Freq: Once | Status: AC
  Administered 2016-05-02: 21:00:00
  Filled 2016-05-02 (×2): qty 1

## 2016-05-02 SURGICAL SUPPLY — 19 items
BALLN EMERGE MR 2.5X12 (BALLOONS) ×2
BALLN ~~LOC~~ TREK RX 3.75X15 (BALLOONS) ×2 IMPLANT
BALLOON EMERGE MR 2.5X12 (BALLOONS) IMPLANT
CATH INFINITI 5 FR JL3.5 (CATHETERS) ×1 IMPLANT
CATH INFINITI 5FR ANG PIGTAIL (CATHETERS) ×2 IMPLANT
CATH INFINITI JR4 5F (CATHETERS) ×1 IMPLANT
DEVICE RAD COMP TR BAND LRG (VASCULAR PRODUCTS) ×1 IMPLANT
GLIDESHEATH SLEND SS 6F .021 (SHEATH) ×1 IMPLANT
GUIDE CATH RUNWAY 6FR CLS3 (CATHETERS) ×1 IMPLANT
KIT ENCORE 26 ADVANTAGE (KITS) ×1 IMPLANT
KIT HEART LEFT (KITS) ×2 IMPLANT
PACK CARDIAC CATHETERIZATION (CUSTOM PROCEDURE TRAY) ×2 IMPLANT
STENT SYNERGY DES 3.5X20 (Permanent Stent) ×1 IMPLANT
SYR MEDRAD MARK V 150ML (SYRINGE) ×2 IMPLANT
TRANSDUCER W/STOPCOCK (MISCELLANEOUS) ×2 IMPLANT
TUBING CIL FLEX 10 FLL-RA (TUBING) ×2 IMPLANT
VALVE GUARDIAN II ~~LOC~~ HEMO (MISCELLANEOUS) ×1 IMPLANT
WIRE ASAHI PROWATER 180CM (WIRE) ×1 IMPLANT
WIRE SAFE-T 1.5MM-J .035X260CM (WIRE) ×2 IMPLANT

## 2016-05-02 NOTE — Consult Note (Signed)
CARDIOLOGY CONSULT NOTE   Patient ID: Ricky [name] MRN: [id number] DOB/AGE: 38/02/1976 38 y.o.  Admit date: 05/01/2016  Primary Physician   No PCP Per Patient Primary Cardiologist: New  Requesting MD: Dr. Maryfrances Bunnellanford Reason for Consultation: Chest pain   HPI: Ricky [name] is a 40 year old male with no previous cardiac history.   He presented to the emergency room on 05/01/2016 with intermittent substernal chest pain that began approximately 2 weeks ago. He endorses mild associated shortness of breath, pain radiates to her lateral axilla. He also reports some nausea and vomiting associated with this pain. Pain lasts for about 1-3 minutes and occurs at both rest and with activity. He also notes that he has numbness in his face axilla and hands.  His EKG shows NSR with T wave inversion in V1. Initial troponin was 0.12, it has increased to 0.20 and then 0.29.He is an active smoker, denies illicit drug use.   He works as a Art gallery managerheating and air technician. Denies ever having any pain like this before. Denies family history of CAD other than his paternal grandfather who had an MI.    Past Medical History  Diagnosis Date  . Pneumothorax   . Chest pain 04/2016  . Headache      Past Surgical History  Procedure Laterality Date  . Abscess drainage      No Known Allergies  I have reviewed the patient's current medications . enoxaparin (LOVENOX) injection  40 mg Subcutaneous Q24H     acetaminophen, gi cocktail, ondansetron (ZOFRAN) IV  Prior to Admission medications   Not on File     Social History   Social History  . Marital Status: Legally Separated    Spouse Name: N/A  . Number of Children: N/A  . Years of Education: N/A   Occupational History  . Not on file.   Social History Main Topics  . Smoking status: Current Every Day Smoker -- 1.00 packs/day for 20 years    Types: Cigars, Cigarettes  . Smokeless tobacco: Never Used  . Alcohol Use: No  . Drug Use: No  . Sexual  Activity: Yes    Birth Control/ Protection: Condom   Other Topics Concern  . Not on file   Social History Narrative    Family Status  Relation Status Death Age  . Mother Alive   . Father Alive   . Other Deceased   . Maternal Aunt Deceased    Family History  Problem Relation Age of Onset  . Heart attack Other   . Stroke Maternal Aunt      ROS:  Full 14 point review of systems complete and found to be negative unless listed above.  Physical Exam: Blood pressure 117/86, pulse 71, temperature 98.1 F (36.7 C), temperature source Oral, resp. rate 18, height 5\' 10"  (1.778 m), weight 164 lb 9.6 oz (74.662 kg), SpO2 98 %.  General: Well developed, well nourished, male in no acute distress Head: Eyes PERRLA, No xanthomas.   Normocephalic and atraumatic, oropharynx without edema or exudate.  Lungs: CTA Heart: HRRR S1 S2, no rub/gallop, No murmur. pulses are 2+ extrem.   Neck: No carotid bruits. No lymphadenopathy.  No JVD. Abdomen: Bowel sounds present, abdomen soft and non-tender without masses or hernias noted. Msk:  No spine or cva tenderness. No weakness, no joint deformities or effusions. Extremities: No clubbing or cyanosis.  edema.  Neuro: Alert and oriented X 3. No focal deficits noted. Psych:  Good affect, responds appropriately  Skin: No rashes or lesions noted.  Labs:   Lab Results  Component Value Date   WBC 7.2 05/01/2016   HGB 16.2 05/01/2016   HCT 46.3 05/01/2016   MCV 95.3 05/01/2016   PLT 278 05/01/2016    Recent Labs Lab 05/01/16 2326  NA 137  K 3.7  CL 107  CO2 24  BUN 8  CREATININE 0.92  CALCIUM 9.4  GLUCOSE 88    Recent Labs  05/01/16 2323 05/02/16 0400 05/02/16 0720  TROPONINI 0.12* 0.20* 0.29*    Recent Labs  05/01/16 2354  TROPIPOC 0.12*   No results found for: BNP Lab Results  Component Value Date   CHOL 205* 05/02/2016   HDL 33* 05/02/2016   LDLCALC 134* 05/02/2016   TRIG 188* 05/02/2016    Echo: pending  ECG:   NSR  Radiology:  Dg Chest 2 View  05/02/2016  CLINICAL DATA:  Mid chest pain, several weeks duration. EXAM: CHEST  2 VIEW COMPARISON:  12/06/2012 FINDINGS: Heart size is normal. Mediastinal shadows are normal. The lungs are clear. The vascularity is normal. No effusions. No bone abnormalities. IMPRESSION: No active cardiopulmonary disease. Electronically Signed   By: Paulina FusiMark  Shogry M.D.   On: 05/02/2016 00:07    ASSESSMENT AND PLAN:    Principal Problem:   Elevated troponin Active Problems:   Chest pain  1. Unstable angina/NSTEMI: patient has risk factors for CAD including smoker for 20 years and HLD. He says his pain occurs at both rest and with activity. He does some heavy lifting at his job and has noticed pain with activity. Given his risk factors, elevated troponin and angina, he would benefit from left heart cath. Other differentials for his pain include pericarditis, but is pain is not relieved with sitting up, and EKG looks not concerning. Can also draw D-Dimer to rule out PE, however likelyhood of this is low. Echo is pending.   2. Tobacco abuse: Encouraged cessation.  3. HLD: Will need diet modifications. Can start statin, especially if found to have CAD.     Signed: Verne Carrowhristopher Ileanna Gemmill, MD 05/02/2016 10:21 AM Pager [phone]  I have personally seen and examined this patient with Suzzette RighterErin Smith, NP. I agree with the assessment and plan as outlined above. He has a history of tobacco abuse and FH of CAD. Admitted with chest pain worrisome c/w unstable angina. Troponin elevated. EKG without ischemic changes. Exam is normal with clear lungs bilaterally, RRR, no LE edema. Labs reviewed. Will plan cardiac cath today with possible PCI. Risks of procedure reviewed with pt. He agrees to proceed. Smoking cessation. Will start IV heparin.   Verne CarrowChristopher Bakary Bramer 05/02/2016 10:23 AM

## 2016-05-02 NOTE — Progress Notes (Signed)
ANTICOAGULATION CONSULT NOTE - Initial Consult  Pharmacy Consult for heparin Indication: chest pain/ACS  No Known Allergies  Patient Measurements: Height: 5\' 10"  (177.8 cm) Weight: 164 lb 9.6 oz (74.662 kg) IBW/kg (Calculated) : 73  Vital Signs: Temp: 98.1 F (36.7 C) (07/06 0308) Temp Source: Oral (07/06 0308) BP: 130/84 mmHg (07/06 1020) Pulse Rate: 74 (07/06 1020)  Labs:  Recent Labs  05/01/16 2323 05/01/16 2326 05/02/16 0400 05/02/16 0720  HGB  --  16.2  --   --   HCT  --  46.3  --   --   PLT  --  278  --   --   CREATININE  --  0.92  --   --   TROPONINI 0.12*  --  0.20* 0.29*    Estimated Creatinine Clearance: 112.4 mL/min (by C-G formula based on Cr of 0.92).   Medical History: Past Medical History  Diagnosis Date  . Pneumothorax   . Chest pain 04/2016  . Headache    Assessment: [age] yoM with no PMHx admitted with substernal CP. Troponin's 0.12 > 0.20 > 0.29. CBC stable. Pharmacy consulted to assist with heparin dosing. Potential cath later today.      Goal of Therapy:  Heparin level 0.3-0.7 units/ml Monitor platelets by anticoagulation protocol: Yes   Plan:  1. Give 4000 units bolus x 1 2. Start heparin infusion at 900 units/hr 3. Check anti-Xa level in 6 hours and daily while on heparin 4. Continue to monitor H&H and platelets  5. F/u plans post cath  Pollyann SamplesAndy Ciana Simmon, PharmD, BCPS 05/02/2016, 11:13 AM Pager: [phone]

## 2016-05-02 NOTE — ED Provider Notes (Signed)
CSN: [id number]     Arrival date & time 05/01/16  2310 History  By signing my name below, I, Bethel BornBritney McCollum, attest that this documentation has been prepared under the direction and in the presence of Gilda Creasehristopher J Earsel Shouse, MD. Electronically Signed: Bethel BornBritney McCollum, ED Scribe. 05/02/2016. 1:48 AM   Chief Complaint  Patient presents with  . Chest Pain   The history is provided by the patient. No language interpreter was used.   Ricky [name] is a 40 y.o. male who presents to the Emergency Department complaining of intermittent, sharp, central chest pain with onset 2 weeks ago. The episodes of pain last for 1 to 3 minutes and occur without identifiable trigger. He notes that the pain radiates to both axilla. Associated symptoms include numbness at the face, axilla, and hands. Pt also complains of bilateral posterior shoulder pain for "a while". He also notes a bump on his penis. Pt denies diaphoresis and SOB. 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   Past Medical History  Diagnosis Date  . Pneumothorax    Past Surgical History  Procedure Laterality Date  . Abscess drainage     Family History  Problem Relation Age of Onset  . Heart attack Other   . Stroke Maternal Aunt    Social History  Substance Use Topics  . Smoking status: Current Every Day Smoker -- 0.00 packs/day    Types: Cigarettes  . Smokeless tobacco: None  . Alcohol Use: No    Review of Systems  Constitutional: Negative for diaphoresis.  Respiratory: Negative for shortness of breath.   Cardiovascular: Positive for chest pain.  Genitourinary:       "bump" on the penis   Musculoskeletal: Positive for arthralgias.  All other systems reviewed and are negative.  Allergies  Review of patient's allergies indicates no known allergies.  Home Medications   Prior to Admission medications   Not on File   BP 127/94 mmHg  Pulse 85  Temp(Src) 97.9 F (36.6 C) (Oral)   Resp 18  SpO2 99% Physical Exam  Constitutional: He is oriented to person, place, and time. He appears well-developed and well-nourished. No distress.  HENT:  Head: Normocephalic and atraumatic.  Right Ear: Hearing normal.  Left Ear: Hearing normal.  Nose: Nose normal.  Mouth/Throat: Oropharynx is clear and moist and mucous membranes are normal.  Eyes: Conjunctivae and EOM are normal. Pupils are equal, round, and reactive to light.  Neck: Normal range of motion. Neck supple.  Cardiovascular: Regular rhythm, S1 normal and S2 normal.  Exam reveals no gallop and no friction rub.   No murmur heard. Pulmonary/Chest: Effort normal and breath sounds normal. No respiratory distress. He exhibits no tenderness.  Abdominal: Soft. Normal appearance and bowel sounds are normal. There is no hepatosplenomegaly. There is no tenderness. There is no rebound, no guarding, no tenderness at McBurney's point and negative Murphy's sign. No hernia.  Genitourinary:  Sebaceous cyst base of penis, no tenderness, erythema, induration  Musculoskeletal:  Painful but full ROM of both shoulders   Neurological: He is alert and oriented to person, place, and time. He has normal strength. No cranial nerve deficit or sensory deficit. Coordination normal. GCS eye subscore is 4. GCS verbal subscore is 5. GCS motor subscore is 6.  Skin: Skin is warm, dry and intact. No rash noted. No cyanosis.  Psychiatric: He has a normal mood and affect. His speech is normal and behavior is  normal. Thought content normal.  Nursing note and vitals reviewed.   ED Course  Procedures (including critical care time) DIAGNOSTIC STUDIES: Oxygen Saturation is 99% on RA,  normal by my interpretation.    COORDINATION OF CARE: 12:21 AM Discussed treatment plan which includes lab work, CXR, EKG with pt at bedside and pt agreed to plan.  1:45 AM-Consult complete with Dr. Tarri Glenn (Cardiology). Patient case explained and discussed. Call ended at 1:47  AM   Labs Review Labs Reviewed  TROPONIN I - Abnormal; Notable for the following:    Troponin I 0.12 (*)    All other components within normal limits  I-STAT TROPOININ, ED - Abnormal; Notable for the following:    Troponin i, poc 0.12 (*)    All other components within normal limits  BASIC METABOLIC PANEL  CBC    Imaging Review Dg Chest 2 View  05/02/2016  CLINICAL DATA:  Mid chest pain, several weeks duration. EXAM: CHEST  2 VIEW COMPARISON:  12/06/2012 FINDINGS: Heart size is normal. Mediastinal shadows are normal. The lungs are clear. The vascularity is normal. No effusions. No bone abnormalities. IMPRESSION: No active cardiopulmonary disease. Electronically Signed   By: Paulina Fusi M.D.   On: 05/02/2016 00:07   I have personally reviewed and evaluated these images and lab results as part of my medical decision-making.   EKG Interpretation   Date/Time:  Wednesday May 01 2016 23:13:09 EDT Ventricular Rate:  84 PR Interval:  140 QRS Duration: 84 QT Interval:  352 QTC Calculation: 415 R Axis:   63 Text Interpretation:  Normal sinus rhythm Normal ECG Confirmed by Tiffney Haughton   MD, Maely Clements ([id number]) on 05/02/2016 12:16:58 AM      MDM   Final diagnoses:  Chest pain, unspecified chest pain type  Sebaceous cyst    Patient has multiple complaints. He is here primarily with complaints of intermittent chest pain has been ongoing for at least a week. Patient reports episodes of pain that is sharp in the center of his chest and radiates to both axilla. He has had associated nausea and has vomited one time tonight because of severe pain. He feels mildly short of breath at times. Patient does not have any known cardiac history. He reports cardiac disease on both sides of his family in secondary relatives, but parents and siblings have not had any diagnosed heart disease. He does not have a primary doctor, has knots had cholesterol checked or any other ongoing medical care. He is a smoker.  Patient's EKG is entirely normal. Symptoms are atypical for cardiac etiology, however, his troponin is mildly elevated at 0.12.  This was discussed with cardiology. Labs and EKG have been reviewed by Dr. Tarri Glenn. He does not recommend treating with heparin and IV nitroglycerin at this time. He recommends admission by hospitalist service for serial enzymes. If second troponin is markedly elevated, recontact cardiology, otherwise he recommends echo in the morning.  Patient also complains of bilateral shoulder pain. He reports that this has been ongoing for several years. He works in Teaching laboratory technician. He has full range of motion but he does have crepitus and clicking with movement of the shoulders. There is nothing urgent about this condition as it has been chronic in nature, patient informed that he will need to follow-up with orthopedics to determine if he has rotator cuff injury.  Patient also complaining of a lesion on his penis. Examination reveals a sebaceous cyst that does not look infected. No further intervention necessary.  He was reassured that this is not an STD.  I personally performed the services described in this documentation, which was scribed in my presence. The recorded information has been reviewed and is accurate.    Gilda Creasehristopher J Mikhaila Roh, MD 05/02/16 [phone]

## 2016-05-02 NOTE — Progress Notes (Signed)
  Echocardiogram 2D Echocardiogram has been performed.  Arvil ChacoFoster, Shaena Parkerson 05/02/2016, 11:21 AM

## 2016-05-02 NOTE — ED Notes (Signed)
PT  Is also rquesting to be seen for bump on his penis and also shoulder pain and crepitus

## 2016-05-02 NOTE — Interval H&P Note (Signed)
Cath Lab Visit (complete for each Cath Lab visit)  Clinical Evaluation Leading to the Procedure:   ACS: Yes.    Non-ACS:    Anginal Classification: CCS IV  Anti-ischemic medical therapy: Minimal Therapy (1 class of medications)  Non-Invasive Test Results: No non-invasive testing performed  Prior CABG: No previous CABG      History and Physical Interval Note:  05/02/2016 1:37 PM  Ricky [name]  has presented today for surgery, with the diagnosis of CAD  The various methods of treatment have been discussed with the patient and family. After consideration of risks, benefits and other options for treatment, the patient has consented to  Procedure(s): Left Heart Cath and Coronary Angiography (N/A) as a surgical intervention .  The patient's history has been reviewed, patient examined, no change in status, stable for surgery.  I have reviewed the patient's chart and labs.  Questions were answered to the patient's satisfaction.     Lance MussJayadeep Chike Farrington

## 2016-05-02 NOTE — H&P (View-Only) (Signed)
CARDIOLOGY CONSULT NOTE   Patient ID: Ricky [name] MRN: [id number] DOB/AGE: 38/02/1976 38 y.o.  Admit date: 05/01/2016  Primary Physician   No PCP Per Patient Primary Cardiologist: New  Requesting MD: Dr. Maryfrances Bunnellanford Reason for Consultation: Chest pain   HPI: Ricky [name] is a 40 year old male with no previous cardiac history.   He presented to the emergency room on 05/01/2016 with intermittent substernal chest pain that began approximately 2 weeks ago. He endorses mild associated shortness of breath, pain radiates to her lateral axilla. He also reports some nausea and vomiting associated with this pain. Pain lasts for about 1-3 minutes and occurs at both rest and with activity. He also notes that he has numbness in his face axilla and hands.  His EKG shows NSR with T wave inversion in V1. Initial troponin was 0.12, it has increased to 0.20 and then 0.29.He is an active smoker, denies illicit drug use.   He works as a Art gallery managerheating and air technician. Denies ever having any pain like this before. Denies family history of CAD other than his paternal grandfather who had an MI.    Past Medical History  Diagnosis Date  . Pneumothorax   . Chest pain 04/2016  . Headache      Past Surgical History  Procedure Laterality Date  . Abscess drainage      No Known Allergies  I have reviewed the patient's current medications . enoxaparin (LOVENOX) injection  40 mg Subcutaneous Q24H     acetaminophen, gi cocktail, ondansetron (ZOFRAN) IV  Prior to Admission medications   Not on File     Social History   Social History  . Marital Status: Legally Separated    Spouse Name: N/A  . Number of Children: N/A  . Years of Education: N/A   Occupational History  . Not on file.   Social History Main Topics  . Smoking status: Current Every Day Smoker -- 1.00 packs/day for 20 years    Types: Cigars, Cigarettes  . Smokeless tobacco: Never Used  . Alcohol Use: No  . Drug Use: No  . Sexual  Activity: Yes    Birth Control/ Protection: Condom   Other Topics Concern  . Not on file   Social History Narrative    Family Status  Relation Status Death Age  . Mother Alive   . Father Alive   . Other Deceased   . Maternal Aunt Deceased    Family History  Problem Relation Age of Onset  . Heart attack Other   . Stroke Maternal Aunt      ROS:  Full 14 point review of systems complete and found to be negative unless listed above.  Physical Exam: Blood pressure 117/86, pulse 71, temperature 98.1 F (36.7 C), temperature source Oral, resp. rate 18, height 5\' 10"  (1.778 m), weight 164 lb 9.6 oz (74.662 kg), SpO2 98 %.  General: Well developed, well nourished, male in no acute distress Head: Eyes PERRLA, No xanthomas.   Normocephalic and atraumatic, oropharynx without edema or exudate.  Lungs: CTA Heart: HRRR S1 S2, no rub/gallop, No murmur. pulses are 2+ extrem.   Neck: No carotid bruits. No lymphadenopathy.  No JVD. Abdomen: Bowel sounds present, abdomen soft and non-tender without masses or hernias noted. Msk:  No spine or cva tenderness. No weakness, no joint deformities or effusions. Extremities: No clubbing or cyanosis.  edema.  Neuro: Alert and oriented X 3. No focal deficits noted. Psych:  Good affect, responds appropriately  Skin: No rashes or lesions noted.  Labs:   Lab Results  Component Value Date   WBC 7.2 05/01/2016   HGB 16.2 05/01/2016   HCT 46.3 05/01/2016   MCV 95.3 05/01/2016   PLT 278 05/01/2016    Recent Labs Lab 05/01/16 2326  NA 137  K 3.7  CL 107  CO2 24  BUN 8  CREATININE 0.92  CALCIUM 9.4  GLUCOSE 88    Recent Labs  05/01/16 2323 05/02/16 0400 05/02/16 0720  TROPONINI 0.12* 0.20* 0.29*    Recent Labs  05/01/16 2354  TROPIPOC 0.12*   No results found for: BNP Lab Results  Component Value Date   CHOL 205* 05/02/2016   HDL 33* 05/02/2016   LDLCALC 134* 05/02/2016   TRIG 188* 05/02/2016    Echo: pending  ECG:   NSR  Radiology:  Dg Chest 2 View  05/02/2016  CLINICAL DATA:  Mid chest pain, several weeks duration. EXAM: CHEST  2 VIEW COMPARISON:  12/06/2012 FINDINGS: Heart size is normal. Mediastinal shadows are normal. The lungs are clear. The vascularity is normal. No effusions. No bone abnormalities. IMPRESSION: No active cardiopulmonary disease. Electronically Signed   By: Mark  Shogry M.D.   On: 05/02/2016 00:07    ASSESSMENT AND PLAN:    Principal Problem:   Elevated troponin Active Problems:   Chest pain  1. Unstable angina/NSTEMI: patient has risk factors for CAD including smoker for 20 years and HLD. He says his pain occurs at both rest and with activity. He does some heavy lifting at his job and has noticed pain with activity. Given his risk factors, elevated troponin and angina, he would benefit from left heart cath. Other differentials for his pain include pericarditis, but is pain is not relieved with sitting up, and EKG looks not concerning. Can also draw D-Dimer to rule out PE, however likelyhood of this is low. Echo is pending.   2. Tobacco abuse: Encouraged cessation.  3. HLD: Will need diet modifications. Can start statin, especially if found to have CAD.     Signed: Christopher McAlhany, MD 05/02/2016 10:21 AM Pager [phone]  I have personally seen and examined this patient with Erin Smith, NP. I agree with the assessment and plan as outlined above. He has a history of tobacco abuse and FH of CAD. Admitted with chest pain worrisome c/w unstable angina. Troponin elevated. EKG without ischemic changes. Exam is normal with clear lungs bilaterally, RRR, no LE edema. Labs reviewed. Will plan cardiac cath today with possible PCI. Risks of procedure reviewed with pt. He agrees to proceed. Smoking cessation. Will start IV heparin.   Christopher McAlhany 05/02/2016 10:23 AM   

## 2016-05-02 NOTE — ED Notes (Signed)
Pt reporting pain at level 1 of 10.

## 2016-05-02 NOTE — ED Notes (Signed)
MD at bedside. 

## 2016-05-02 NOTE — Progress Notes (Signed)
[facility] PLAN OF CARE NOTE Patient: Ricky [name] [id number]   PCP: No PCP Per Patient DOB: 12/15/1975   DOA: 05/01/2016   DOS: 05/02/2016    Patient was admitted by my colleague Dr.Danford earlier on 05/02/2016. I have reviewed the H&P as well as assessment and plan and agree with the same. Important changes in the plan are listed below.  Plan of care: Principal Problem:   Elevated troponin Active Problems:   Chest pain   NSTEMI (non-ST elevated myocardial infarction) [facility](HCC) Status post cardiac cath, stenting to mid LAD. Lipitor 80 mg added due to elevated LDL. Nicotine patch added.   Author: Lynden OxfordPranav Maizie Garno, MD [facility] Pager: [phone] 05/02/2016 6:38 PM   If 7PM-7AM, please contact night-coverage at [email or web address], password [email or web address]

## 2016-05-02 NOTE — ED Notes (Signed)
Troponin reported to dr Blinda Leatherwoodpollina

## 2016-05-02 NOTE — H&P (Signed)
History and Physical  Patient Name: Ricky [name]     ZOX:[id number]    DOB: 09/14/1976    DOA: 05/01/2016 PCP: No PCP Per Patient   Patient coming from: Home     Chief Complaint: Chest pain  HPI: Ricky [name] is a 40 y.o. male with a past medical history significant for smoking who presents with chest pain.  The pain started "a few weeks ago" and is intermittent, brief sharp and moderate in intensity.  It is located in mid chest, radiates to both armpits, and tonight was more severe, lasted longer (>5 minutes) and was associated with vomiting x1 and sweats and shortness of breath, and so he came to the ER.  ED course: -Aferbile, hemodynamically stable, saturating well on room air -Initial ECG showed normal sinus rhythm with no ischemic changes but troponin was 0.12 pg/mL. -Na 137, K 3.7, Cr 0.9, WBC 7.2K, Hgb 16 -CXR clear -The case was discussed with cardiology who reviewed the ECG and recommended hospitalists admission, serial enzymes, and echocardiogram in the morning  The patient is an active smoker, no previous history of hypertension, cardiovascular disease, diabetes that he knows of, obesity, or first-degree relatives with coronary disease. He has a maternal aunt with a stroke at age 38, and a paternal grandfather had a myocardial infarction in his 2870s. He has not used cocaine or IV drugs. No recent fevers, skin infections. He has had no recent surgery, prolonged sitting or travel.    Review of Systems:  Pt complains of chest pain, shortness of breath, emesis, cold sweats. All other systems negative except as just noted or noted in the history of present illness.  Past Medical History  Diagnosis Date  . Pneumothorax     Past Surgical History  Procedure Laterality Date  . Abscess drainageIn 2014 for presumed hidradenitis      Social History: Patient lives With his daughter. He works in [employer].  Patient walks unassisted. He is a smoker. He denies illicit drug use.      No Known Allergies  Family history: family history includes Heart attack in his other; Stroke in his maternal aunt.  Prior to Admission medications   Not on File       Physical Exam: BP 114/72 mmHg  Pulse 65  Temp(Src) 97.9 F (36.6 C) (Oral)  Resp 14  SpO2 98% General appearance: Well-developed, adult male, alert and in no acute distress.   Eyes: Anicteric, conjunctiva pink, lids and lashes normal.     ENT: No nasal deformity, discharge, or epistaxis.  OP moist without lesions.  Tongue dark from smoking.  Skin: Warm and dry.   Cardiac: RRR, nl S1-S2, no murmurs appreciated.  Capillary refill is brisk.  JVP normal.  No LE edema.  Radial and DP pulses 2+ and symmetric.  No carotid bruits. Respiratory: Normal respiratory rate and rhythm.  CTAB without rales or wheezes. GI: Abdomen soft without rigidity.  No TTP. No ascites, distension.   MSK: No deformities or effusions.   Pain not reproduced with palpation of precordium.  No pain with arm movement. Neuro: Sensorium intact and responding to questions, attention normal.  Speech is fluent.  Moves all extremities equally and with normal coordination.    Psych: Behavior appropriate.  Affect normal.  No evidence of aural or visual hallucinations or delusions.       Labs on Admission:  The metabolic panel shows normal electrolytes and renal function. The complete blood count shows no anemia, leukocytosis, or thrombocytopenia.  The initial troponin is minimally elevated.  Radiological Exams on Admission: Personally reviewed: Dg Chest 2 View  05/02/2016  CLINICAL DATA:  Mid chest pain, several weeks duration. EXAM: CHEST  2 VIEW COMPARISON:  12/06/2012 FINDINGS: Heart size is normal. Mediastinal shadows are normal. The lungs are clear. The vascularity is normal. No effusions. No bone abnormalities. IMPRESSION: No active cardiopulmonary disease. Electronically Signed   By: Paulina FusiMark  Shogry M.D.   On: 05/02/2016 00:07    EKG:  Independently reviewed. Rate 84, sinus rhythm, QTC 4:15, no T-wave inversions or ST changes.    Assessment/Plan 1. Chest pain and elevated troponin: This is new.  The patient has HEART score of 3. Chest pain is atypical and other causes of elevated troponin (pericarditis, PE) that would be visible on echo seem likely.  Patient satisfies PERC rule, so d-dimer deferred for now.  No pneumonia or pneumothorax on CXR.    -Serial troponins are ordered -Telemetry -Echocardiogram ordered -Check lipid panel -Consult to cardiology, appreciate recommendations -Smoking cessation was recommended, nursing teaching ordered       DVT prophylaxis: Lovenox Diet: NPO after 4am for anticipated stress testing Code Status: Full  Family Communication: Daughter at bedside  Disposition Plan: Anticipate overnight observation for arrhythmia on telemetry, serial troponins and subsequent  risk stratification by Cardiology.  If testing negative, home after. Consults called: Cardiology Admission status: Telemetry, OBS   Medical decision making: Patient seen at 2:45 AM on 05/02/2016.  The patient was discussed with Dr. Blinda LeatherwoodPollina. What exists of the patient's chart was reviewed in depth.  Clinical condition: stable.      Alberteen SamChristopher P Danford [facility] Pager [phone]

## 2016-05-02 NOTE — Progress Notes (Signed)
TR BAND REMOVAL  LOCATION:    right radial  DEFLATED PER PROTOCOL:    Yes.    TIME BAND OFF / DRESSING APPLIED:    1900   SITE UPON ARRIVAL:    Level 0  SITE AFTER BAND REMOVAL:    Level 0  CIRCULATION SENSATION AND MOVEMENT:    Within Normal Limits   Yes.    COMMENTS:   Tolerated procedure well 

## 2016-05-03 ENCOUNTER — Telehealth: Payer: Self-pay | Admitting: Nurse Practitioner

## 2016-05-03 DIAGNOSIS — I214 Non-ST elevation (NSTEMI) myocardial infarction: Principal | ICD-10-CM

## 2016-05-03 DIAGNOSIS — I25110 Atherosclerotic heart disease of native coronary artery with unstable angina pectoris: Secondary | ICD-10-CM | POA: Diagnosis present

## 2016-05-03 DIAGNOSIS — Z720 Tobacco use: Secondary | ICD-10-CM | POA: Diagnosis present

## 2016-05-03 LAB — BASIC METABOLIC PANEL
ANION GAP: 6 (ref 5–15)
BUN: 7 mg/dL (ref 6–20)
CALCIUM: 9.1 mg/dL (ref 8.9–10.3)
CHLORIDE: 108 mmol/L (ref 101–111)
CO2: 24 mmol/L (ref 22–32)
Creatinine, Ser: 0.9 mg/dL (ref 0.61–1.24)
GFR calc non Af Amer: >60 mL/min (ref >60)
GLUCOSE: 87 mg/dL (ref 65–99)
POTASSIUM: 3.7 mmol/L (ref 3.5–5.1)
Sodium: 138 mmol/L (ref 135–145)

## 2016-05-03 LAB — CBC
HEMATOCRIT: 45.7 % (ref 39.0–52.0)
HEMOGLOBIN: 15.9 g/dL (ref 13.0–17.0)
MCH: 32.9 pg (ref 26.0–34.0)
MCHC: 34.8 g/dL (ref 30.0–36.0)
MCV: 94.6 fL (ref 78.0–100.0)
Platelets: 274 10*3/uL (ref 150–400)
RBC: 4.83 MIL/uL (ref 4.22–5.81)
RDW: 14.4 % (ref 11.5–15.5)
WBC: 8.3 10*3/uL (ref 4.0–10.5)

## 2016-05-03 LAB — MAGNESIUM: MAGNESIUM: 2 mg/dL (ref 1.7–2.4)

## 2016-05-03 MED ORDER — NITROGLYCERIN 0.4 MG SL SUBL: 0.4000 mg | SUBLINGUAL_TABLET | SUBLINGUAL | Status: DC | PRN

## 2016-05-03 MED ORDER — NICOTINE 14 MG/24HR TD PT24: 14.0000 mg | MEDICATED_PATCH | Freq: Every day | TRANSDERMAL | Status: DC

## 2016-05-03 MED ORDER — ASPIRIN EC 81 MG PO TBEC: 81.0000 mg | DELAYED_RELEASE_TABLET | Freq: Every day | ORAL | Status: DC

## 2016-05-03 MED ORDER — TICAGRELOR 90 MG PO TABS: 90.0000 mg | ORAL_TABLET | Freq: Two times a day (BID) | ORAL | Status: DC

## 2016-05-03 MED ORDER — ATORVASTATIN CALCIUM 80 MG PO TABS: 80.0000 mg | ORAL_TABLET | Freq: Every day | ORAL | Status: DC

## 2016-05-03 NOTE — Progress Notes (Signed)
CARDIAC REHAB PHASE I   PRE:  Rate/Rhythm: 85 SR  BP:  Sitting: 139/90        SaO2: 99 RA  MODE:  Ambulation: 800 ft   POST:  Rate/Rhythm: 88 SR  BP:  Sitting: 151/82         SaO2: 100 RA  Pt ambulated 800 ft on RA, independent, steady gait, tolerated well with no complaints. Completed MI/stent education.  Reviewed risk factors, tobacco cessation (pt declined fake cigarette, pt also admits to smoking marijuana regularly, advised cessation), MI book, anti-platelet therapy, stent card, activity restrictions, ntg, exercise, heart healthy diet, portion control, and phase 2 cardiac rehab. Pt verbalized understanding, states he is overwhelmed but receptive to education. Pt agrees to phase 2 cardiac rehab referral, [facility]. Pt does not have insurance, case manager to see regarding brilinta prior to discharge. Pt to recliner after walk, call bell within reach.  [phone] Joylene GrapesEmily C Madoc Holquin, RN, BSN 05/03/2016 9:00 AM

## 2016-05-03 NOTE — Care Management Note (Signed)
Case Management Note  Patient Details  Name: Ricky [name] MRN: [id number] Date of Birth: Dec 17, 1975  Subjective/Objective:       CM following for progression and d/c planning.             Action/Plan: 05/03/2016 Met with pt who has been offered the "[facility]" Study  Which will provide Brilinta and ASA medications for this pt . Pt given [place] letter to cover other prescription drugs. Attempted to schedule this pt at the [facility], they are no scheduling appointments until July 10th, Pt given information re clinic and instructed to call on July 10th for an appointment the week of May 13, 2016. Pt also instructed to ask for info on [facility] at the time of call. Pt verbalized understanding and able to repeat instructions.   Expected Discharge Date:     05/03/2016             Expected Discharge Plan:  Home w Hospice Care  In-House Referral:  NA  Discharge planning Services  CM Consult, [facility], Medication Assistance, [facility]  Post Acute Care Choice:  NA Choice offered to:  NA  DME Arranged:  N/A DME Agency:  NA  HH Arranged:  NA HH Agency:  NA  Status of Service:  Completed, signed off  If discussed at [place] of Stay Meetings, dates discussed:    Additional Comments:  Adron Bene, RN 05/03/2016, 11:02 AM

## 2016-05-03 NOTE — Progress Notes (Addendum)
     SUBJECTIVE: No chest pain or SOB.   Tele: sinus  BP 127/86 mmHg  Pulse 58  Temp(Src) 97.9 F (36.6 C) (Oral)  Resp 20  Ht 5\' 10"  (1.778 m)  Wt 164 lb 0.4 oz (74.4 kg)  BMI 23.53 kg/m2  SpO2 99%  Intake/Output Summary (Last 24 hours) at 05/03/16 [id number] Last data filed at 05/03/16 0101  Gross per 24 hour  Intake 1204.35 ml  Output   1500 ml  Net -295.65 ml    PHYSICAL EXAM General: Well developed, well nourished, in no acute distress. Alert and oriented x 3.  Psych:  Good affect, responds appropriately Neck: No JVD. No masses noted.  Lungs: Clear bilaterally with no wheezes or rhonci noted.  Heart: RRR with no murmurs noted. Abdomen: Bowel sounds are present. Soft, non-tender.  Extremities: No lower extremity edema.   LABS: Basic Metabolic Panel:  Recent Labs  19/14/7806/03/13 2326 05/03/16 0415  NA 137 138  K 3.7 3.7  CL 107 108  CO2 24 24  GLUCOSE 88 87  BUN 8 7  CREATININE 0.92 0.90  CALCIUM 9.4 9.1  MG  --  2.0   CBC:  Recent Labs  05/01/16 2326 05/03/16 0415  WBC 7.2 8.3  HGB 16.2 15.9  HCT 46.3 45.7  MCV 95.3 94.6  PLT 278 274   Cardiac Enzymes:  Recent Labs  05/02/16 0400 05/02/16 0720 05/02/16 0958  TROPONINI 0.20* 0.29* 0.32*   Fasting Lipid Panel:  Recent Labs  05/02/16 0400  CHOL 205*  HDL 33*  LDLCALC 134*  TRIG 188*  CHOLHDL 6.2    Current Meds: . aspirin  81 mg Oral Daily  . atorvastatin  80 mg Oral q1800  . nicotine  14 mg Transdermal Daily  . sodium chloride flush  3 mL Intravenous Q12H  . ticagrelor  90 mg Oral BID     ASSESSMENT AND PLAN:  1. CAD/NSTEMI: Pt admitted with chest pain/NSTEMI. Cardiac cath 05/02/16 with severe stenosis mid LAD. This was treated with a drug eluting stent x 1. He is on ASA, Brilinta and will need DAPT for one year. Continue statin. No beta blocker with bradycardia.   2. Tobacco abuse: Smoking cessation encouraged.    Dispo: He can be discharged home today after cardiac rehab walks  patient. [facility] [address] [facility]. I will follow him long term.   Verne CarrowChristopher McAlhany  7/7/20177:21 AM

## 2016-05-03 NOTE — Research (Signed)
Patient [facility].  He will receive a 3 month supply of ticagrelor (90 mg twice a day) and aspirin (81 mg once daily) from the research staff team before leaving the hospital.    At 3 months after enrollment, if eligible, he will stay on the ticagrelor for an additonal 12 months, but will be randomized to either stay on the low dose aspirin or receive a placebo for an additional 12 months. Ticagrelor and aspirin will be provided by the research staff for the next 15 months.  The subject will not need a prescription at discharge for the ticagrelor.  Claire ShownSally Shahida Schnackenberg, RN, Research Nurse [phone]

## 2016-05-03 NOTE — Research (Signed)
Subject has been given oral and written discharge instructions for the [facility].  The following study medications were given to the subject: Ticagrelor [id number] Aspirin      [id number]  Claire ShownSally Kanon Novosel, RN, Research Nurse [phone]

## 2016-05-03 NOTE — Research (Signed)
TWILIGHT Research Study Informed Consent   Subject Name: Ricky [name]  Subject met inclusion and exclusion criteria.  The informed consent form, study requirements and expectations were reviewed with the subject and questions and concerns were addressed prior to the signing of the consent form.  The subject verbalized understanding of the trial requirements.  The subject agreed to participate in the trial and signed the informed consent.  The informed consent was obtained prior to performance of any protocol-specific procedures for the subject.  A copy of the signed informed consent was given to the subject and a copy was placed in the subject's medical record.  [name] 05/03/2016, 9:16 AM

## 2016-05-03 NOTE — Telephone Encounter (Signed)
New message      TCM appt on 05-13-16 with Lawson FiscalLori Gerhardt---per Suzzette RighterErin Smith

## 2016-05-05 NOTE — Discharge Summary (Signed)
[facility] Discharge Summary   Patient: Ricky [name]   PCP: No PCP Per Patient DOB: 1976-09-27   Date of admission: 05/01/2016   Date of discharge: 05/03/2016   Discharge Diagnoses:  Principal Problem:   Elevated troponin Active Problems:   Chest pain   NSTEMI (non-ST elevated myocardial infarction) (HCC)   Tobacco abuse   Coronary artery disease involving native coronary artery of native heart with unstable angina pectoris [facility])  Admitted From: Home Disposition:  Home with cardiac rehabilitation  Recommendations for Outpatient Follow-up:  1. Follow-up with cardiology as recommended   Follow-up Information    Follow up with Norma Fredrickson, NP On 05/13/2016.   Specialties:  Nurse Practitioner, Interventional Cardiology, Cardiology, Radiology   Why:  at 9:30am for follow up   Contact information:   [address] [place] [address] [phone]       Follow up with [facility].   Why:  Please call on 05/06/2016 to schedule an appointment for hospital followup.    Contact information:   [address] [place] [address] [phone]     Diet recommendation: Cardiac diet  Activity: The patient is advised to gradually reintroduce usual activities.  Discharge Condition: good  Code Status: Full code  History of present illness: As per the H and P dictated on admission, "Ricky [name] is a 40 y.o. male with a past medical history significant for smoking who presents with chest pain.  The pain started "a few weeks ago" and is intermittent, brief sharp and moderate in intensity. It is located in mid chest, radiates to both armpits, and tonight was more severe, lasted longer (>5 minutes) and was associated with vomiting x1 and sweats and shortness of breath, and so he came to the ER.  The patient is an active smoker, no previous history of hypertension, cardiovascular disease, diabetes  that he knows of, obesity, or first-degree relatives with coronary disease. He has a maternal aunt with a stroke at age 38, and a paternal grandfather had a myocardial infarction in his 79s. He has not used cocaine or IV drugs. No recent fevers, skin infections. He has had no recent surgery, prolonged sitting or travel."  Hospital Course:  Summary of his active problems in the hospital is as following.  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 Patient did have some elevation of troponin without any EKG changes suggestive of ischemia. Cardiology was consulted. Patient was scheduled for a cardiac catheterization which showed mid LAD stenosis. Patient underwent drug-eluting stent placement. Next and patient will be discharged with aspirin and Brilinta and will take it for at least one year. Patient is enrolled in a research trial for Brilinta and anteverted medication will be provided by the trial protocol. Lipitor added to the regimen. Patient was recommended to stop smoking. Nicotine patch was provided on discharge. When necessary nitroglycerin was also provided on discharge. Blood pressure was stable therefore beta blocker was not indicated. Ejection fraction was normal therefore no ACE inhibitor was indicated.  All other chronic medical condition were stable during the hospitalization.  Patient was ambulatory without any assistance. On the day of the discharge the patient's vitals were stable, and no other acute medical  condition were reported by patient. the patient was felt safe to be discharge at home with family.  Procedures and Results:  Echocardiogram Study Conclusions  - Left ventricle: The cavity size was normal. Systolic  function was  normal. The estimated ejection fraction was in the range of 55%  to 60%. Wall motion was normal; there were no regional wall  motion abnormalities. Left ventricular diastolic function  parameters were normal. - Atrial septum: No defect or patent foramen ovale was identified.   Left cardiac catheterization with PCI  The left ventricular systolic function is normal. Mid LAD lesion, 90% stenosed. Post intervention witha 3.5 x 20 mm Synergy DES, postdilated to 3.8 mm, there is a 0% residual stenosis. Normal LVEDP 11 mm Hg. Continue IV tirofiban for 3 hours post procedure.   Continue dual antiplatelet therapy for at least a year. Continue aggressive secondary prevention including smoking cessation.   Consultations:  Cardiology  DISCHARGE MEDICATION: Discharge Medication List as of 05/03/2016 11:33 AM    START taking these medications   Details  aspirin EC 81 MG tablet Take 1 tablet (81 mg total) by mouth daily., Starting 05/03/2016, Until Discontinued, Normal    ticagrelor (BRILINTA) 90 MG TABS tablet Take 1 tablet (90 mg total) by mouth 2 (two) times daily., Starting 05/03/2016, Until Discontinued, Normal      CONTINUE these medications which have CHANGED   Details  atorvastatin (LIPITOR) 80 MG tablet Take 1 tablet (80 mg total) by mouth daily at 6 PM., Starting 05/03/2016, Until Discontinued, Print    nicotine (NICODERM CQ - DOSED IN MG/24 HOURS) 14 mg/24hr patch Place 1 patch (14 mg total) onto the skin daily., Starting 05/03/2016, Until Discontinued, Print    nitroGLYCERIN (NITROSTAT) 0.4 MG SL tablet Place 1 tablet (0.4 mg total) under the tongue every 5 (five) minutes as needed for chest pain., Starting 05/03/2016, Until Discontinued, Print       No Known Allergies Discharge Instructions    Amb Referral to Cardiac Rehabilitation    Complete by:  As directed   Diagnosis:   Coronary Stents NSTEMI       Diet - low sodium heart healthy    Complete by:  As directed        Discharge instructions    Complete by:  As directed   It is important that you read following instructions as well as go over your medication list with RN to help you understand your care after this hospitalization.  Discharge Instructions:  Please follow-up with PCP in one week  Please request your primary care physician to go over all Hospital Tests and Procedure/Radiological results at the follow up,  Please get all Hospital records sent to your PCP by signing hospital release before you go home.   Do not take more than prescribed Pain, Sleep and Anxiety Medications. You were cared for by a hospitalist during your hospital stay. If you have any questions about your discharge medications or the care you received while you were in the hospital after you are discharged, you can call the unit and ask to speak with the hospitalist on call if the hospitalist that took care of you is not available.  Once you are discharged, your primary care physician will handle any further medical issues. Please note that NO REFILLS for any discharge medications will be authorized once you are discharged, as it is imperative that you return to your primary care physician (or establish a relationship with a primary care physician if  you do not have one) for your aftercare needs so that they can reassess your need for medications and monitor your lab values. You Must read complete instructions/literature along with all the possible adverse reactions/side effects for all the Medicines you take and that have been prescribed to you. Take any new Medicines after you have completely understood and accept all the possible adverse reactions/side effects. Wear Seat belts while driving. If you have smoked or chewed Tobacco in the last 2 yrs please stop smoking and/or stop any Recreational drug use.     Increase activity slowly    Complete by:  As directed           Discharge Exam: Filed Weights   05/02/16 0308  05/03/16 0555  Weight: 74.662 kg (164 lb 9.6 oz) 74.4 kg (164 lb 0.4 oz)   Filed Vitals:   05/03/16 0724 05/03/16 0800  BP: 126/96   Pulse: 75   Temp: 97.7 F (36.5 C)   Resp: 17 27   General: Appear in no distress, no Rash; Oral Mucosa moist. Cardiovascular: S1 and S2 Present, no Murmur, no JVD Respiratory: Bilateral Air entry present and Clear to Auscultation, no Crackles, no wheezes Abdomen: Bowel Sound present, Soft and no tenderness Extremities: no Pedal edema, no calf tenderness Neurology: Grossly no focal neuro deficit.  The results of significant diagnostics from this hospitalization (including imaging, microbiology, ancillary and laboratory) are listed below for reference.    Significant Diagnostic Studies: Dg Chest 2 View  05/02/2016  CLINICAL DATA:  Mid chest pain, several weeks duration. EXAM: CHEST  2 VIEW COMPARISON:  12/06/2012 FINDINGS: Heart size is normal. Mediastinal shadows are normal. The lungs are clear. The vascularity is normal. No effusions. No bone abnormalities. IMPRESSION: No active cardiopulmonary disease. Electronically Signed   By: Paulina Fusi M.D.   On: 05/02/2016 00:07    Microbiology: No results found for this or any previous visit (from the past 240 hour(s)).   Labs: CBC:  Recent Labs Lab 05/01/16 2326 05/03/16 0415  WBC 7.2 8.3  HGB 16.2 15.9  HCT 46.3 45.7  MCV 95.3 94.6  PLT 278 274   Basic Metabolic Panel:  Recent Labs Lab 05/01/16 2326 05/03/16 0415  NA 137 138  K 3.7 3.7  CL 107 108  CO2 24 24  GLUCOSE 88 87  BUN 8 7  CREATININE 0.92 0.90  CALCIUM 9.4 9.1  MG  --  2.0   Liver Function Tests: No results for input(s): AST, ALT, ALKPHOS, BILITOT, PROT, ALBUMIN in the last 168 hours. No results for input(s): LIPASE, AMYLASE in the last 168 hours. No results for input(s): AMMONIA in the last 168 hours. Cardiac Enzymes:  Recent Labs Lab 05/01/16 2323 05/02/16 0400 05/02/16 0720 05/02/16 0958  TROPONINI 0.12* 0.20*  0.29* 0.32*   BNP (last 3 results) No results for input(s): BNP in the last 8760 hours. CBG: No results for input(s): GLUCAP in the last 168 hours. Time spent: 30 minutes  Signed:  Neyland Pettengill  [facility] 05/03/2016 , 11:27 PM

## 2016-05-06 NOTE — Telephone Encounter (Signed)
SPOKE WITH  PT.  PER PT  HAS NO PROBLEMS  UNDERSTANDS  DISCHARGE  INSTRUCTIONS  ,HAS REQUIRED  MEDS  AND  IS  AWARE OF F/U APPT .Ricky [name]/CY

## 2016-05-08 ENCOUNTER — Other Ambulatory Visit: Payer: Self-pay | Admitting: *Deleted

## 2016-05-08 MED ORDER — AMBULATORY NON FORMULARY MEDICATION: 90.0000 mg | Freq: Two times a day (BID) | Status: DC

## 2016-05-08 MED ORDER — AMBULATORY NON FORMULARY MEDICATION: 81.0000 mg | Freq: Every day | Status: DC

## 2016-05-13 ENCOUNTER — Encounter: Payer: Self-pay | Admitting: Nurse Practitioner

## 2016-05-13 ENCOUNTER — Ambulatory Visit (INDEPENDENT_AMBULATORY_CARE_PROVIDER_SITE_OTHER): Payer: Self-pay | Admitting: Nurse Practitioner

## 2016-05-13 VITALS — BP 108/70 | HR 62 | Ht 70.0 in | Wt 166.8 lb

## 2016-05-13 DIAGNOSIS — Z955 Presence of coronary angioplasty implant and graft: Secondary | ICD-10-CM

## 2016-05-13 DIAGNOSIS — I214 Non-ST elevation (NSTEMI) myocardial infarction: Secondary | ICD-10-CM

## 2016-05-13 DIAGNOSIS — Z720 Tobacco use: Secondary | ICD-10-CM

## 2016-05-13 LAB — BASIC METABOLIC PANEL
BUN: 8 mg/dL (ref 7–25)
CO2: 25 mmol/L (ref 20–31)
Calcium: 9.9 mg/dL (ref 8.6–10.3)
Chloride: 104 mmol/L (ref 98–110)
Creat: 1.08 mg/dL (ref 0.60–1.35)
Glucose, Bld: 83 mg/dL (ref 65–99)
Potassium: 4.3 mmol/L (ref 3.5–5.3)
Sodium: 141 mmol/L (ref 135–146)

## 2016-05-13 LAB — CBC
HCT: 46.1 % (ref 38.5–50.0)
Hemoglobin: 16.2 g/dL (ref 13.2–17.1)
MCH: 32.9 pg (ref 27.0–33.0)
MCHC: 35.1 g/dL (ref 32.0–36.0)
MCV: 93.5 fL (ref 80.0–100.0)
MPV: 9.8 fL (ref 7.5–12.5)
Platelets: 393 10*3/uL (ref 140–400)
RBC: 4.93 MIL/uL (ref 4.20–5.80)
RDW: 13.8 % (ref 11.0–15.0)
WBC: 7.9 10*3/uL (ref 3.8–10.8)

## 2016-05-13 NOTE — Patient Instructions (Addendum)
We will be checking the following labs today - BMET and CBC   Medication Instructions:    Continue with your current medicines.     Testing/Procedures To Be Arranged:  N/A  Follow-Up:   See Dr. Clifton JamesMcAlhany and team in 2 weeks   Other Special Instructions:   Increase your salt - I think this will help you feel better to get your blood pressure a little higher  Keep working on total smoking cessation  Try to limit your exposure to the heat as well as to the extreme cold    If you need a refill on your cardiac medications before your next appointment, please call your pharmacy.   Call the [facility] office at [phone] if you have any questions, problems or concerns.

## 2016-05-13 NOTE — Progress Notes (Signed)
CARDIOLOGY OFFICE NOTE  Date:  05/13/2016    Ricky Salaamimothy M Eyer Date of Birth: 02/21/1976 Medical Record #[id number]  PCP:  No PCP Per Patient  Cardiologist:  Clifton JamesMcAlhany   Chief Complaint  Patient presents with  . Coronary Artery Disease  . Hyperlipidemia    TOC visit - seen for Dr. Clifton JamesMcAlhany    History of Present Illness: Ricky [name] is a 40 y.o. male who presents today for a TOC visit. Seen for Dr. Clifton JamesMcAlhany,   He has no real past medical history. He is a smoker.   Presented earlier this month with chest pain - was cathed - see below. Ruled in for NSTEMI. On DAPT for one year. No beta blocker due to bradycardia. Placed on high dose statin therapy.    Comes back today. Here alone. Lots of issues. He gets dizzy and lightheaded. He feels "the floor shaking in his chest" while he was out in the lobby. No real chest pain similar to what led him to the hospital. He is now vaping "some". He has made drastic changes with is diet/lifestyle. He has no insurance. In the TWILIGHT study for his DAPT.    Past Medical History  Diagnosis Date  . Pneumothorax   . NSTEMI (non-ST elevated myocardial infarction) (HCC) 04/2016    PCI to the mid LAD; normal LV function  . Headache   . Tobacco abuse     Past Surgical History  Procedure Laterality Date  . Abscess drainage    . Cardiac catheterization N/A 05/02/2016    Procedure: Left Heart Cath and Coronary Angiography;  Surgeon: Corky CraftsJayadeep S Varanasi, MD;  [facility];  Service: Cardiovascular;  Laterality: N/A;  . Cardiac catheterization N/A 05/02/2016    Procedure: Coronary Stent Intervention;  Surgeon: Corky CraftsJayadeep S Varanasi, MD;  [facility];  Service: Cardiovascular;  Laterality: N/A;     Medications: Current Outpatient Prescriptions  Medication Sig Dispense Refill  . AMBULATORY NON FORMULARY MEDICATION Take 90 mg by mouth 2 (two) times daily. Medication Name: Brilinta 90 mg BID (TWILIGHT Research Study  Provided Do not fill)    . AMBULATORY NON FORMULARY MEDICATION Take 81 mg by mouth daily. Medication Name: Aspirin 81 mg Daily (TWILIGHT Research study Provided)    . atorvastatin (LIPITOR) 80 MG tablet Take 1 tablet (80 mg total) by mouth daily at 6 PM. 30 tablet 0  . nicotine (NICODERM CQ - DOSED IN MG/24 HOURS) 14 mg/24hr patch Place 1 patch (14 mg total) onto the skin daily. 28 patch 0  . nitroGLYCERIN (NITROSTAT) 0.4 MG SL tablet Place 1 tablet (0.4 mg total) under the tongue every 5 (five) minutes as needed for chest pain. 60 tablet 0   No current facility-administered medications for this visit.    Allergies: No Known Allergies  Social History: The patient  reports that he has been smoking Cigars and Cigarettes.  He has a 20 pack-year smoking history. He uses smokeless tobacco. He reports that he does not drink alcohol or use illicit drugs.   Family History: The patient's family history includes Heart attack in his other; Heart attack (age of onset: 9534) in his maternal grandmother; Stroke in his maternal aunt.   Review of Systems: Please see the history of present illness.   Otherwise, the review of systems is positive for none.   All other systems are reviewed and negative.   Physical Exam: VS:  BP 108/70 mmHg  Pulse 62  Ht   (1.778 m)  Wt 166 lb 12.8 oz (75.66 kg)  BMI 23.93 kg/m2 .  BMI Body mass index is 23.93 kg/(m^2).  Wt Readings from Last 3 Encounters:  05/13/16 166 lb 12.8 oz (75.66 kg)  05/03/16 164 lb 0.4 oz (74.4 kg)  05/06/13 173 lb 6.4 oz (78.654 kg)   BP by me is 104/60  General: Pleasant. Well developed, well nourished and in no acute distress.  HEENT: Normal. Neck: Supple, no JVD, carotid bruits, or masses noted.  Cardiac: Regular rate and rhythm. No murmurs, rubs, or gallops. No edema.  Respiratory:  Lungs are clear to auscultation bilaterally with normal work of breathing.  GI: Soft and nontender.  MS: No deformity or atrophy. Gait and ROM  intact. Skin: Warm and dry. Color is normal.  Neuro:  Strength and sensation are intact and no gross focal deficits noted.  Psych: Alert, appropriate and with normal affect.   LABORATORY DATA:  EKG:  EKG is ordered today. This demonstrates NSR today.  Lab Results  Component Value Date   WBC 8.3 05/03/2016   HGB 15.9 05/03/2016   HCT 45.7 05/03/2016   PLT 274 05/03/2016   GLUCOSE 87 05/03/2016   CHOL 205* 05/02/2016   TRIG 188* 05/02/2016   HDL 33* 05/02/2016   LDLCALC 134* 05/02/2016   ALT 17 01/29/2012   AST 25 01/29/2012   NA 138 05/03/2016   K 3.7 05/03/2016   CL 108 05/03/2016   CREATININE 0.90 05/03/2016   BUN 7 05/03/2016   CO2 24 05/03/2016   INR 1.00 02/08/2010   HGBA1C 5.2 04/20/2013    BNP (last 3 results) No results for input(s): BNP in the last 8760 hours.  ProBNP (last 3 results) No results for input(s): PROBNP in the last 8760 hours.   Other Studies Reviewed Today: Procedures 04/2016   Coronary Stent Intervention   Left Heart Cath and Coronary Angiography    Conclusion     The left ventricular systolic function is normal.  Mid LAD lesion, 90% stenosed. Post intervention witha 3.5 x 20 mm Synergy DES, postdilated to 3.8 mm, there is a 0% residual stenosis.  Normal LVEDP 11 mm Hg.  Continue IV tirofiban for 3 hours post procedure.   Continue dual antiplatelet therapy for at least a year. Continue aggressive secondary prevention including smoking cessation.     Echo Study Conclusions from 04/2016  - Left ventricle: The cavity size was normal. Systolic function was  normal. The estimated ejection fraction was in the range of 55%  to 60%. Wall motion was normal; there were no regional wall  motion abnormalities. Left ventricular diastolic function  parameters were normal. - Atrial septum: No defect or patent foramen ovale was identified.   Assessment/Plan: 1. CAD/NSTEMI:  Cardiac cath 05/02/16 with severe stenosis mid LAD. This  was treated with a drug eluting stent x 1. He is on ASA, Brilinta and will need DAPT for one year. He is in the TWILIGHT STUDY. Continue statin. No beta blocker with bradycardia. Needs follow up labs today. Discussed CV risk factor modification in depth especially his diet. Ok to liberalize his salt use some. I think this will help him feel better. He works in [employer] - exposed to both extremes of the temperature - ok to return to work next week if able to limit this exposure as best as he can. Needs to maintain hydration. He has no insurance and needs to return to work.   2. Tobacco abuse: Total  smoking cessation encouraged.   3. HLD - on statin therapy. Plan to repeat fasting labs in about 4 weeks.  4. Dizziness/lightheadedness - I suspect this is more related to his BP - would let him liberalize his salt use some.  Current medicines are reviewed with the patient today.  The patient does not have concerns regarding medicines other than what has been noted above.  The following changes have been made:  See above.  Labs/ tests ordered today include:    Orders Placed This Encounter  Procedures  . Basic metabolic panel  . CBC  . EKG 12-Lead     Disposition:   FU with Dr. Clifton James and team in 2 weeks for recheck.  Patient is agreeable to this plan and will call if any problems develop in the interim.   Signed: Rosalio Macadamia, RN, ANP-C 05/13/2016 10:07 AM  [facility] [address] [place], [place]  [address] Phone: [phone] Fax: [phone]

## 2016-05-14 ENCOUNTER — Encounter: Payer: Self-pay | Admitting: Internal Medicine

## 2016-05-14 ENCOUNTER — Ambulatory Visit: Payer: Self-pay | Attending: Internal Medicine | Admitting: Internal Medicine

## 2016-05-14 VITALS — BP 117/74 | HR 68 | Temp 98.3°F | Wt 164.0 lb

## 2016-05-14 DIAGNOSIS — I25110 Atherosclerotic heart disease of native coronary artery with unstable angina pectoris: Secondary | ICD-10-CM

## 2016-05-14 DIAGNOSIS — F32A Depression, unspecified: Secondary | ICD-10-CM | POA: Insufficient documentation

## 2016-05-14 DIAGNOSIS — Z720 Tobacco use: Secondary | ICD-10-CM

## 2016-05-14 DIAGNOSIS — F329 Major depressive disorder, single episode, unspecified: Secondary | ICD-10-CM

## 2016-05-14 DIAGNOSIS — I214 Non-ST elevation (NSTEMI) myocardial infarction: Secondary | ICD-10-CM

## 2016-05-14 MED ORDER — ATORVASTATIN CALCIUM 80 MG PO TABS: 80.0000 mg | ORAL_TABLET | Freq: Every day | ORAL | Status: DC

## 2016-05-14 MED ORDER — ESCITALOPRAM OXALATE 10 MG PO TABS: 10.0000 mg | ORAL_TABLET | Freq: Every day | ORAL | Status: DC

## 2016-05-14 MED FILL — ESCITALOPRAM 10 MG TABLET: 10 | 30 days supply | Qty: 30 | Fill #0

## 2016-05-14 MED FILL — ATORVASTATIN 80 MG TABLET: 80 | 30 days supply | Qty: 30 | Fill #0

## 2016-05-14 NOTE — Progress Notes (Signed)
Ricky [name], is a 40 y.o. male  RUE:[id number]  JYN:[id number]  DOB - 09/11/1976  CC:  Chief Complaint  Patient presents with  . Hospitalization Follow-up    Chest Pain       HPI: Ricky [name] is a 40 y.o. male here today to establish medical care, new to clinic, w/ signif PMHX of NSTEMI, sp PCI to mid LAD on 05/02/16, on Brilinta bid, asa 81 , atorvastatin 80 qhs.  Since than, he is trying to reducing cig tob smoking with the Vapor cigs.  He denies sob/cp/orthopnea.  Dizziness yesterday resolved as sell (that he had in cardiology clinic).  Pt also c/o of feeling depressed for years, no energy, sleeps all day.  Per pt, no hx of prior suicide attempts, but had SI about 1 year ago.  He says his 38 yo dgt notices that he has been more depressed lately as well. He denies si/hi/avh currently. amendable to trying medication.  Patient has No headache, No chest pain, No abdominal pain - No Nausea, No new weakness tingling or numbness, No Cough - SOB.    Review of Systems: Per HPI, o/w all systems reviewed and negative.   No Known Allergies Past Medical History  Diagnosis Date  . Pneumothorax   . NSTEMI (non-ST elevated myocardial infarction) (HCC) 04/2016    PCI to the mid LAD; normal LV function  . Headache   . Tobacco abuse    Current Outpatient Prescriptions on File Prior to Visit  Medication Sig Dispense Refill  . AMBULATORY NON FORMULARY MEDICATION Take 90 mg by mouth 2 (two) times daily. Medication Name: Brilinta 90 mg BID (TWILIGHT Research Study Provided Do not fill)    . AMBULATORY NON FORMULARY MEDICATION Take 81 mg by mouth daily. Medication Name: Aspirin 81 mg Daily (TWILIGHT Research study Provided)    . nitroGLYCERIN (NITROSTAT) 0.4 MG SL tablet Place 1 tablet (0.4 mg total) under the tongue every 5 (five) minutes as needed for chest pain. 60 tablet 0  . nicotine (NICODERM CQ - DOSED IN MG/24 HOURS) 14 mg/24hr patch Place 1 patch (14 mg total) onto the skin  daily. (Patient not taking: Reported on 05/14/2016) 28 patch 0   No current facility-administered medications on file prior to visit.   Family History  Problem Relation Age of Onset  . Heart attack Other   . Stroke Maternal Aunt   . Heart attack Maternal Grandmother [id number]    deceased   Social History   Social History  . Marital Status: Legally Separated    Spouse Name: N/A  . Number of Children: N/A  . Years of Education: N/A   Occupational History  . Not on file.   Social History Main Topics  . Smoking status: Current Every Day Smoker -- 1.00 packs/day for 20 years    Types: Cigars, Cigarettes  . Smokeless tobacco: Current User  . Alcohol Use: No  . Drug Use: No  . Sexual Activity: Yes    Birth Control/ Protection: Condom   Other Topics Concern  . Not on file   Social History Narrative    Objective:   Filed Vitals:   05/14/16 1041  BP: 117/74  Pulse: 68  Temp: 98.3 F (36.8 C)    Filed Weights   05/14/16 1041  Weight: 164 lb (74.39 kg)    BP Readings from Last 3 Encounters:  05/14/16 117/74  05/13/16 108/70  05/03/16 126/96    Physical Exam: Constitutional: Patient appears well-developed and well-nourished. No  distress. AAOx3, pleasant, good eye contact. HENT: Normocephalic, atraumatic, External right and left ear normal. Oropharynx is clear and moist.  Eyes: Conjunctivae and EOM are normal. PERRL, no scleral icterus. Neck: Normal ROM. Neck supple. No JVD.  CVS: RRR, S1/S2 +, no murmurs, no gallops, no carotid bruit.  Pulmonary: Effort and breath sounds normal, no stridor, rhonchi, wheezes, rales.  Abdominal: Soft. BS +, no distension, tenderness, rebound or guarding.  Musculoskeletal: Normal range of motion. No edema and no tenderness.  LE: bilat/ no c/c/e, pulses 2+ bilateral. Neuro: Alert.  muscle tone coordination wnl. No cranial nerve deficit grossly. Skin: Skin is warm and dry. No rash noted. Not diaphoretic. No erythema. No  pallor. Psychiatric: Normal mood and affect. Behavior, judgment, thought content normal.  Lab Results  Component Value Date   WBC 7.9 05/13/2016   HGB 16.2 05/13/2016   HCT 46.1 05/13/2016   MCV 93.5 05/13/2016   PLT 393 05/13/2016   Lab Results  Component Value Date   CREATININE 1.08 05/13/2016   BUN 8 05/13/2016   NA 141 05/13/2016   K 4.3 05/13/2016   CL 104 05/13/2016   CO2 25 05/13/2016    Lab Results  Component Value Date   HGBA1C 5.2 04/20/2013   Lipid Panel     Component Value Date/Time   CHOL 205* 05/02/2016 0400   TRIG 188* 05/02/2016 0400   HDL 33* 05/02/2016 0400   CHOLHDL 6.2 05/02/2016 0400   VLDL 38 05/02/2016 0400   LDLCALC 134* 05/02/2016 0400       Depression screen PHQ 2/9 05/14/2016  Decreased Interest 1  Down, Depressed, Hopeless 2  PHQ - 2 Score 3  Altered sleeping 1  Tired, decreased energy 2  Change in appetite 1  Feeling bad or failure about yourself  2  Trouble concentrating 1  Moving slowly or fidgety/restless 1  Suicidal thoughts 0  PHQ-9 Score 11  Difficult doing work/chores Somewhat difficult    Assessment and plan:   1. Cad/nstemi, sp PCI to LAD 05/02/16 - continue asa 81, atorvastatin 80 qhs, and brillanta 90 bid  2. tob abuse - weaning off cigs w/ vapor, encouraged complete cessation.   3. Depression No si/hi/avh currently, per pt had SI last year, but no plan, did not seek help at time, depression ongoing for "years.", no energy, sleeps all the time, "sad" sometimes, no periods of Mania described. - d/w pt re; lexapro, wants to try, will start lexapro 10 qdy, - f/u next month on this - gave pt info on [facility] as well.  4. Financial aid packet.  Return in about 4 weeks (around 06/11/2016) for depression.  The patient was given clear instructions to go to ER or return to medical center if symptoms don't improve, worsen or new problems develop. The patient verbalized understanding. The patient was  told to call to get lab results if they haven't heard anything in the next week.    This note has been created with Education officer, environmental. Any transcriptional errors are unintentional.   Pete Glatter, MD, MBA/MHA [facility] [place], [place] [phone]   05/14/2016, 11:15 AM

## 2016-05-14 NOTE — Patient Instructions (Addendum)
Major Depressive Disorder Major depressive disorder is a mental illness. It also may be called clinical depression or unipolar depression. Major depressive disorder usually causes feelings of sadness, hopelessness, or helplessness. Some people with this disorder do not feel particularly sad but lose interest in doing things they used to enjoy (anhedonia). Major depressive disorder also can cause physical symptoms. It can interfere with work, school, relationships, and other normal everyday activities. The disorder varies in severity but is longer lasting and more serious than the sadness we all feel from time to time in our lives. Major depressive disorder often is triggered by stressful life events or major life changes. Examples of these triggers include divorce, loss of your job or home, a move, and the death of a family member or close friend. Sometimes this disorder occurs for no obvious reason at all. People who have family members with major depressive disorder or bipolar disorder are at higher risk for developing this disorder, with or without life stressors. Major depressive disorder can occur at any age. It may occur just once in your life (single episode major depressive disorder). It may occur multiple times (recurrent major depressive disorder). SYMPTOMS People with major depressive disorder have either anhedonia or depressed mood on nearly a daily basis for at least 2 weeks or longer. Symptoms of depressed mood include:  Feelings of sadness (blue or down in the dumps) or emptiness.  Feelings of hopelessness or helplessness.  Tearfulness or episodes of crying (may be observed by others).  Irritability (children and adolescents). In addition to depressed mood or anhedonia or both, people with this disorder have at least four of the following symptoms:  Difficulty sleeping or sleeping too much.   Significant change (increase or decrease) in appetite or weight.   Lack of energy or  motivation.  Feelings of guilt and worthlessness.   Difficulty concentrating, remembering, or making decisions.  Unusually slow movement (psychomotor retardation) or restlessness (as observed by others).   Recurrent wishes for death, recurrent thoughts of self-harm (suicide), or a suicide attempt. People with major depressive disorder commonly have persistent negative thoughts about themselves, other people, and the world. People with severe major depressive disorder may experiencedistorted beliefs or perceptions about the world (psychotic delusions). They also may see or hear things that are not real (psychotic hallucinations). DIAGNOSIS Major depressive disorder is diagnosed through an assessment by your health care provider. Your health care provider will ask aboutaspects of your daily life, such as mood,sleep, and appetite, to see if you have the diagnostic symptoms of major depressive disorder. Your health care provider may ask about your medical history and use of alcohol or drugs, including prescription medicines. Your health care provider also may do a physical exam and blood work. This is because certain medical conditions and the use of certain substances can cause major depressive disorder-like symptoms (secondary depression). Your health care provider also may refer you to a mental health specialist for further evaluation and treatment. TREATMENT It is important to recognize the symptoms of major depressive disorder and seek treatment. The following treatments can be prescribed for this disorder:   Medicine. Antidepressant medicines usually are prescribed. Antidepressant medicines are thought to correct chemical imbalances in the brain that are commonly associated with major depressive disorder. Other types of medicine may be added if the symptoms do not respond to antidepressant medicines alone or if psychotic delusions or hallucinations occur.  Talk therapy. Talk therapy can be  helpful in treating major depressive disorder by providing   support, education, and guidance. Certain types of talk therapy also can help with negative thinking (cognitive behavioral therapy) and with relationship issues that trigger this disorder (interpersonal therapy). A mental health specialist can help determine which treatment is best for you. Most people with major depressive disorder do well with a combination of medicine and talk therapy. Treatments involving electrical stimulation of the brain can be used in situations with extremely severe symptoms or when medicine and talk therapy do not work over time. These treatments include electroconvulsive therapy, transcranial magnetic stimulation, and vagal nerve stimulation.   This information is not intended to replace advice given to you by your health care provider. Make sure you discuss any questions you have with your health care provider.   Document Released: 02/08/2013 Document Revised: 11/04/2014 Document Reviewed: 02/08/2013 Elsevier Interactive Patient Education 2016 [employer] Inc[employer]  - Escitalopram tablets What is this medicine? ESCITALOPRAM (es sye TAL oh pram) is used to treat depression and certain types of anxiety. This medicine may be used for other purposes; ask your health care provider or pharmacist if you have questions. What should I tell my health care provider before I take this medicine? They need to know if you have any of these conditions: -bipolar disorder or a family history of bipolar disorder -diabetes -glaucoma -heart disease -kidney or liver disease -receiving electroconvulsive therapy -seizures (convulsions) -suicidal thoughts, plans, or attempt by you or a family member -an unusual or allergic reaction to escitalopram, the related drug citalopram, other medicines, foods, dyes, or preservatives -pregnant or trying to become pregnant -breast-feeding How should I use this medicine? Take this medicine by  mouth with a glass of water. Follow the directions on the prescription label. You can take it with or without food. If it upsets your stomach, take it with food. Take your medicine at regular intervals. Do not take it more often than directed. Do not stop taking this medicine suddenly except upon the advice of your doctor. Stopping this medicine too quickly may cause serious side effects or your condition may worsen. A special MedGuide will be given to you by the pharmacist with each prescription and refill. Be sure to read this information carefully each time. Talk to your pediatrician regarding the use of this medicine in children. Special care may be needed. Overdosage: If you think you have taken too much of this medicine contact a poison control center or emergency room at once. NOTE: This medicine is only for you. Do not share this medicine with others. What if I miss a dose? If you miss a dose, take it as soon as you can. If it is almost time for your next dose, take only that dose. Do not take double or extra doses. What may interact with this medicine? Do not take this medicine with any of the following medications: -certain medicines for fungal infections like fluconazole, itraconazole, ketoconazole, posaconazole, voriconazole -cisapride -citalopram -dofetilide -dronedarone -linezolid -MAOIs like Carbex, Eldepryl, Marplan, Nardil, and Parnate -methylene blue (injected into a vein) -pimozide -thioridazine -ziprasidone This medicine may also interact with the following medications: -alcohol -aspirin and aspirin-like medicines -carbamazepine -certain medicines for depression, anxiety, or psychotic disturbances -certain medicines for migraine headache like almotriptan, eletriptan, frovatriptan, naratriptan, rizatriptan, sumatriptan, zolmitriptan -certain medicines for sleep -certain medicines that treat or prevent blood clots like warfarin, enoxaparin,  dalteparin -cimetidine -diuretics -fentanyl -furazolidone -isoniazid -lithium -metoprolol -NSAIDs, medicines for pain and inflammation, like ibuprofen or naproxen -other medicines that prolong the QT interval (cause an abnormal heart  rhythm) -procarbazine -rasagiline -supplements like St. John's wort, kava kava, valerian -tramadol -tryptophan This list may not describe all possible interactions. Give your health care provider a list of all the medicines, herbs, non-prescription drugs, or dietary supplements you use. Also tell them if you smoke, drink alcohol, or use illegal drugs. Some items may interact with your medicine. What should I watch for while using this medicine? Tell your doctor if your symptoms do not get better or if they get worse. Visit your doctor or health care professional for regular checks on your progress. Because it may take several weeks to see the full effects of this medicine, it is important to continue your treatment as prescribed by your doctor. Patients and their families should watch out for new or worsening thoughts of suicide or depression. Also watch out for sudden changes in feelings such as feeling anxious, agitated, panicky, irritable, hostile, aggressive, impulsive, severely restless, overly excited and hyperactive, or not being able to sleep. If this happens, especially at the beginning of treatment or after a change in dose, call your health care professional. Bonita QuinYou may get drowsy or dizzy. Do not drive, use machinery, or do anything that needs mental alertness until you know how this medicine affects you. Do not stand or sit up quickly, especially if you are an older patient. This reduces the risk of dizzy or fainting spells. Alcohol may interfere with the effect of this medicine. Avoid alcoholic drinks. Your mouth may get dry. Chewing sugarless gum or sucking hard candy, and drinking plenty of water may help. Contact your doctor if the problem does not go  away or is severe. What side effects may I notice from receiving this medicine? Side effects that you should report to your doctor or health care professional as soon as possible: -allergic reactions like skin rash, itching or hives, swelling of the face, lips, or tongue -confusion -feeling faint or lightheaded, falls -fast talking and excited feelings or actions that are out of control -hallucination, loss of contact with reality -seizures -suicidal thoughts or other mood changes -unusual bleeding or bruising Side effects that usually do not require medical attention (report to your doctor or health care professional if they continue or are bothersome): -blurred vision -changes in appetite -change in sex drive or performance -headache -increased sweating -nausea This list may not describe all possible side effects. Call your doctor for medical advice about side effects. You may report side effects to FDA at 1-800-FDA-1088. Where should I keep my medicine? Keep out of reach of children. Store at room temperature between 15 and 30 degrees C (59 and 86 degrees F). Throw away any unused medicine after the expiration date. NOTE: This sheet is a summary. It may not cover all possible information. If you have questions about this medicine, talk to your doctor, pharmacist, or health care provider.    2016, Elsevier/Gold Standard. (2013-05-11 12:32:55)   - Coronary Angiogram With Stent Coronary angiography with stent placement is a procedure to widen or open a narrow blood vessel of the heart (coronary artery). When a coronary artery becomes partially blocked, it decreases blood flow to that area. This may lead to chest pain or a heart attack (myocardial infarction). Arteries may become blocked by cholesterol buildup (plaque) in the lining or wall.  A stent is a small piece of metal that looks like a mesh or a spring. Stent placement may be done right after a coronary angiography in which a  blocked artery is found or  as a treatment for a heart attack.  LET [facility] CARE PROVIDER KNOW ABOUT:  Any allergies you have.   All medicines you are taking, including vitamins, herbs, eye drops, creams, and over-the-counter medicines.   Previous problems you or members of your family have had with the use of anesthetics.   Any blood disorders you have.   Previous surgeries you have had.   Medical conditions you have. RISKS AND COMPLICATIONS Generally, coronary angiography with stent is a safe procedure. However, problems can occur and include:  Damage to the heart or its blood vessels.   A return of blockage.   Bleeding, infection, or bruising at the insertion site.   A collection of blood under the skin (hematoma) at the insertion site.  Blood clot in another part of the body.   Kidney injury.   Allergic reaction to the dye or contrast used.   Bleeding into the abdomen (retroperitoneal bleeding). BEFORE THE PROCEDURE  Do not eat or drink anything after midnight on the night before the procedure or as directed by your health care provider.  Ask your health care provider about changing or stopping your regular medicines. This is especially important if you are taking diabetes medicines or blood thinners.  Your health care provider will make sure you understand the procedure as well as the risks and potential problems associated with the procedure.  PROCEDURE  You may be given a medicine to help you relax before and during the procedure (sedative). This medicine will be given through an IV tube that is put into one of your veins.   The area where the catheter will be inserted will be shaved and cleaned. This is usually done in the groin but may be done in the fold of your arm (near your elbow) or in the wrist.   A medicine will be given to numb the area where the catheter will be inserted (local anesthetic).   The catheter will be inserted into an  artery using a guide wire. A type of X-ray (fluoroscopy) will be used to help guide the catheter to the opening of the blocked artery.   A dye will then be injected into the catheter, and X-rays will be taken. The dye will help to show where any narrowing or blockages are located in the heart arteries.   A tiny wire will be guided to the blocked spot, and a balloon will be inflated to make the artery wider. The stent will be expanded and will crush the plaque into the wall of the vessel. The stent will hold the area open like a scaffolding and improve the blood flow.   Sometimes the artery may be made wider using a laser or other tools to remove plaque.   When the blood flow is better, the catheter will be removed. The lining of the artery will grow over the stent, which stays where it was placed.  AFTER THE PROCEDURE  If the procedure is done through the leg, you will be kept in bed lying flat for about 6 hours. You will be instructed to not bend or cross your legs.   The insertion site will be checked frequently.   The pulse in your feet or wrist will be checked frequently.   Additional blood tests, X-rays, and electrocardiography may be done.   This information is not intended to replace advice given to you by your health care provider. Make sure you discuss any questions you have with your health care provider.  Document Released: 04/20/2003 Document Revised: 11/04/2014 Document Reviewed: 03/08/2013 Elsevier Interactive Patient Education 2016 [employer].  - Smoking Cessation, Tips for Success If you are ready to quit smoking, congratulations! You have chosen to help yourself be healthier. Cigarettes bring nicotine, tar, carbon monoxide, and other irritants into your body. Your lungs, heart, and blood vessels will be able to work better without these poisons. There are many different ways to quit smoking. Nicotine gum, nicotine patches, a nicotine inhaler, or nicotine nasal  spray can help with physical craving. Hypnosis, support groups, and medicines help break the habit of smoking. WHAT THINGS CAN I DO TO MAKE QUITTING EASIER?  Here are some tips to help you quit for good:  Pick a date when you will quit smoking completely. Tell all of your friends and family about your plan to quit on that date.  Do not try to slowly cut down on the number of cigarettes you are smoking. Pick a quit date and quit smoking completely starting on that day.  Throw away all cigarettes.   Clean and remove all ashtrays from your home, work, and car.  On a card, write down your reasons for quitting. Carry the card with you and read it when you get the urge to smoke.  Cleanse your body of nicotine. Drink enough water and fluids to keep your urine clear or pale yellow. Do this after quitting to flush the nicotine from your body.  Learn to predict your moods. Do not let a bad situation be your excuse to have a cigarette. Some situations in your life might tempt you into wanting a cigarette.  Never have "just one" cigarette. It leads to wanting another and another. Remind yourself of your decision to quit.  Change habits associated with smoking. If you smoked while driving or when feeling stressed, try other activities to replace smoking. Stand up when drinking your coffee. Brush your teeth after eating. Sit in a different chair when you read the paper. Avoid alcohol while trying to quit, and try to drink fewer caffeinated beverages. Alcohol and caffeine may urge you to smoke.  Avoid foods and drinks that can trigger a desire to smoke, such as sugary or spicy foods and alcohol.  Ask people who smoke not to smoke around you.  Have something planned to do right after eating or having a cup of coffee. For example, plan to take a walk or exercise.  Try a relaxation exercise to calm you down and decrease your stress. Remember, you may be tense and nervous for the first 2 weeks after you  quit, but this will pass.  Find new activities to keep your hands busy. Play with a pen, coin, or rubber band. Doodle or draw things on paper.  Brush your teeth right after eating. This will help cut down on the craving for the taste of tobacco after meals. You can also try mouthwash.   Use oral substitutes in place of cigarettes. Try using lemon drops, carrots, cinnamon sticks, or chewing gum. Keep them handy so they are available when you have the urge to smoke.  When you have the urge to smoke, try deep breathing.  Designate your home as a nonsmoking area.  If you are a heavy smoker, ask your health care provider about a prescription for nicotine chewing gum. It can ease your withdrawal from nicotine.  Reward yourself. Set aside the cigarette money you save and buy yourself something nice.  Look for support from others. Join a  support group or smoking cessation program. Ask someone at home or at work to help you with your plan to quit smoking.  Always ask yourself, "Do I need this cigarette or is this just a reflex?" Tell yourself, "Today, I choose not to smoke," or "I do not want to smoke." You are reminding yourself of your decision to quit.  Do not replace cigarette smoking with electronic cigarettes (commonly called e-cigarettes). The safety of e-cigarettes is unknown, and some may contain harmful chemicals.  If you relapse, do not give up! Plan ahead and think about what you will do the next time you get the urge to smoke. HOW WILL I FEEL WHEN I QUIT SMOKING? You may have symptoms of withdrawal because your body is used to nicotine (the addictive substance in cigarettes). You may crave cigarettes, be irritable, feel very hungry, cough often, get headaches, or have difficulty concentrating. The withdrawal symptoms are only temporary. They are strongest when you first quit but will go away within 10-14 days. When withdrawal symptoms occur, stay in control. Think about your reasons for  quitting. Remind yourself that these are signs that your body is healing and getting used to being without cigarettes. Remember that withdrawal symptoms are easier to treat than the major diseases that smoking can cause.  Even after the withdrawal is over, expect periodic urges to smoke. However, these cravings are generally short lived and will go away whether you smoke or not. Do not smoke! WHAT RESOURCES ARE AVAILABLE TO HELP ME QUIT SMOKING? Your health care provider can direct you to community resources or hospitals for support, which may include:  Group support.  Education.  Hypnosis.  Therapy.   This information is not intended to replace advice given to you by your health care provider. Make sure you discuss any questions you have with your health care provider.   Document Released: 07/12/2004 Document Revised: 11/04/2014 Document Reviewed: 04/01/2013 Elsevier Interactive Patient Education [employer]

## 2016-05-28 ENCOUNTER — Telehealth (HOSPITAL_COMMUNITY): Payer: Self-pay | Admitting: Cardiac Rehabilitation

## 2016-05-28 NOTE — Telephone Encounter (Signed)
pc to pt to discuss enrolling in cardiac rehab. Pt declined. He has returned to work. Pt does have financial assistance application that he is working on getting documents together for completion.

## 2016-05-29 ENCOUNTER — Telehealth: Payer: Self-pay | Admitting: *Deleted

## 2016-05-29 ENCOUNTER — Encounter: Payer: Self-pay | Admitting: *Deleted

## 2016-05-29 ENCOUNTER — Other Ambulatory Visit: Payer: Self-pay | Admitting: *Deleted

## 2016-05-29 DIAGNOSIS — Z006 Encounter for examination for normal comparison and control in clinical research program: Secondary | ICD-10-CM

## 2016-05-29 NOTE — Progress Notes (Signed)
[facility] Research study 1 month telephone follow up completed. Patient denies any bleeding or other adverse events. He also states he has been compliant with Brilinta and ASA. Next research appointment has been scheduled for 08/02/16 @ 8 am. At this appointment he will be randomized to ASA or Placebo and continue Brilinta. Questions encouraged and answered.

## 2016-05-29 NOTE — Telephone Encounter (Signed)
Left message for patient to return call to research office for TWILIGHT 1 month follow up and to schedule 3 month appointment.

## 2016-05-30 ENCOUNTER — Ambulatory Visit (INDEPENDENT_AMBULATORY_CARE_PROVIDER_SITE_OTHER): Payer: Self-pay | Admitting: Cardiovascular Disease

## 2016-05-30 ENCOUNTER — Encounter: Payer: Self-pay | Admitting: Cardiovascular Disease

## 2016-05-30 VITALS — BP 90/56 | HR 68 | Ht 70.0 in | Wt 163.0 lb

## 2016-05-30 DIAGNOSIS — E785 Hyperlipidemia, unspecified: Secondary | ICD-10-CM

## 2016-05-30 DIAGNOSIS — I2510 Atherosclerotic heart disease of native coronary artery without angina pectoris: Secondary | ICD-10-CM

## 2016-05-30 DIAGNOSIS — Z720 Tobacco use: Secondary | ICD-10-CM

## 2016-05-30 NOTE — Patient Instructions (Signed)
Medication Instructions:  Your physician recommends that you continue on your current medications as directed. Please refer to the Current Medication list given to you today.   Labwork:  Your physician recommends that you return for lab work in: about 8 weeks. This is scheduled for 07/23/16.  This is fasting. The lab opens at 7:30 AM   Testing/Procedures: none  Follow-Up: Your physician wants you to follow-up in: 6 months.  You will receive a reminder letter in the mail two months in advance. If you don't receive a letter, please call our office to schedule the follow-up appointment.   Any Other Special Instructions Will Be Listed Below (If Applicable).     If you need a refill on your cardiac medications before your next appointment, please call your pharmacy.

## 2016-05-30 NOTE — Progress Notes (Signed)
Chief Complaint  Patient presents with  . Follow-up    2 week    History of Present Illness: 40 yo male with history of CAD, tobacco abuse who is here today for follow up. He was admitted to [facility] 05/01/16 with a NSTEMI. Cardiac cath 05/02/16 with 90% mid LAD stenosis treated with a 3.5 x 20mm Synergy DES. Normal LV function. He is in the TWILIGHT study on ASA and Brilinta.   He is here today for follow up. He is feeling well. NO chest pain or dyspnea. Rare dizziness but no near syncope. He has stopped smoking. He is using a vape several times per day.   Primary Care Physician: Pete Glatter, MD   Past Medical History:  Diagnosis Date  . Headache   . NSTEMI (non-ST elevated myocardial infarction) (HCC) 04/2016   PCI to the mid LAD; normal LV function  . Pneumothorax   . Tobacco abuse     Past Surgical History:  Procedure Laterality Date  . ABSCESS DRAINAGE    . CARDIAC CATHETERIZATION N/A 05/02/2016   Procedure: Left Heart Cath and Coronary Angiography;  Surgeon: Corky Crafts, MD;  Location: [facility] INVASIVE CV LAB;  Service: Cardiovascular;  Laterality: N/A;  . CARDIAC CATHETERIZATION N/A 05/02/2016   Procedure: Coronary Stent Intervention;  Surgeon: Corky Crafts, MD;  Location: [facility] INVASIVE CV LAB;  Service: Cardiovascular;  Laterality: N/A;    Current Outpatient Prescriptions  Medication Sig Dispense Refill  . AMBULATORY NON FORMULARY MEDICATION Take 90 mg by mouth 2 (two) times daily. Medication Name: Brilinta 90 mg BID (TWILIGHT Research Study Provided Do not fill)    . AMBULATORY NON FORMULARY MEDICATION Take 81 mg by mouth daily. Medication Name: Aspirin 81 mg Daily (TWILIGHT Research study Provided)    . atorvastatin (LIPITOR) 80 MG tablet Take 1 tablet (80 mg total) by mouth daily at 6 PM. 90 tablet 3  . escitalopram (LEXAPRO) 10 MG tablet Take 1 tablet (10 mg total) by mouth daily. 30 tablet 1  . nitroGLYCERIN (NITROSTAT) 0.4 MG SL tablet Place 1 tablet (0.4 mg  total) under the tongue every 5 (five) minutes as needed for chest pain. 60 tablet 0   No current facility-administered medications for this visit.     No Known Allergies  Social History   Social History  . Marital status: Divorced    Spouse name: N/A  . Number of children: N/A  . Years of education: N/A   Occupational History  . Not on file.   Social History Main Topics  . Smoking status: Current Every Day Smoker    Packs/day: 1.00    Years: 20.00    Types: Cigars, Cigarettes  . Smokeless tobacco: Current User  . Alcohol use No  . Drug use: No  . Sexual activity: Yes    Birth control/ protection: Condom   Other Topics Concern  . Not on file   Social History Narrative  . No narrative on file    Family History  Problem Relation Age of Onset  . Healthy Mother   . Healthy Father   . Heart attack Other   . Stroke Maternal Aunt   . Heart attack Maternal Grandmother 34    deceased    Review of Systems:  As stated in the HPI and otherwise negative.   BP (!) 90/56   Pulse 68   Ht  (1.778 m)   Wt 163 lb (73.9 kg)   BMI 23.39 kg/m  Physical Examination: General: Well developed, well nourished, NAD  HEENT: OP clear, mucus membranes moist  SKIN: warm, dry. No rashes. Neuro: No focal deficits  Musculoskeletal: Muscle strength 5/5 all ext  Psychiatric: Mood and affect normal  Neck: No JVD, no carotid bruits, no thyromegaly, no lymphadenopathy.  Lungs:Clear bilaterally, no wheezes, rhonci, crackles Cardiovascular: Regular rate and rhythm. No murmurs, gallops or rubs. Abdomen:Soft. Bowel sounds present. Non-tender.  Extremities: No lower extremity edema. Pulses are 2 + in the bilateral DP/PT.  Cardiac cath 7//6/17: Left Anterior Descending  Mid LAD lesion, 90% stenosed. Thrombotic.  PCI: The pre-interventional distal flow is normal (TIMI 3). Pre-stent angioplasty was performed. A drug-eluting stent was placed. The strut is apposed. Post-stent angioplasty  was performed. The post-interventional distal flow is normal (TIMI 3). The intervention was successful. No complications occurred at this lesion.  Supplies used: BALLOON EMERGE MR 2.5X12; STENT SYNERGY DES 3.5X20; BALLOON Donahue TREK RX [id number]  There is no residual stenosis post intervention.  Ramus Intermedius  . Vessel is small.   Echo 05/02/16: Left ventricle: The cavity size was normal. Systolic function was   normal. The estimated ejection fraction was in the range of 55%   to 60%. Wall motion was normal; there were no regional wall   motion abnormalities. Left ventricular diastolic function   parameters were normal. - Atrial septum: No defect or patent foramen ovale was identified.  EKG:  EKG is not ordered today. The ekg ordered today demonstrates   Recent Labs: 05/03/2016: Magnesium 2.0 05/13/2016: BUN 8; Creat 1.08; Hemoglobin 16.2; Platelets 393; Potassium 4.3; Sodium 141   Lipid Panel    Component Value Date/Time   CHOL 205 (H) 05/02/2016 0400   TRIG 188 (H) 05/02/2016 0400   HDL 33 (L) 05/02/2016 0400   CHOLHDL 6.2 05/02/2016 0400   VLDL 38 05/02/2016 0400   LDLCALC 134 (H) 05/02/2016 0400     Wt Readings from Last 3 Encounters:  05/30/16 163 lb (73.9 kg)  05/14/16 164 lb (74.4 kg)  05/13/16 166 lb 12.8 oz (75.7 kg)     Other studies Reviewed: Additional studies/ records that were reviewed today include: . Review of the above records demonstrates:   Assessment and Plan:   1. CAD: He is s/p NSTEMI July 2017 with placement of a single drug eluting stent in the mid LAD. LV function is normal. He is on ASA, Brilinta and statin. He is in the TWILIGHT study protocol (3 months post stent will either be on ASA/brilinta or Brilinta alone). No beta blocker with bradycardia and hypotension.   2. Hyperlipidemia: Continue high intensity statin. Check lipids and LFTS in 8 weeks.   3. Tobacco abuse: Smoking cessation advised. He has stopped smoking but still using a vape.  Counseling given.   Current medicines are reviewed at length with the patient today.  The patient does not have concerns regarding medicines.  The following changes have been made:  no change  Labs/ tests ordered today include:   Orders Placed This Encounter  Procedures  . Lipid Profile  . Hepatic function panel     Disposition:   FU with me in 6 months   Signed, Verne Carrow, MD 05/30/2016 11:59 AM    [facility] [address] [place], [place], [place]  [address] Phone: [phone]; Fax: [phone]

## 2016-06-12 ENCOUNTER — Encounter: Payer: Self-pay | Admitting: Internal Medicine

## 2016-06-12 ENCOUNTER — Ambulatory Visit: Payer: Self-pay | Attending: Internal Medicine | Admitting: Internal Medicine

## 2016-06-12 VITALS — BP 126/81 | HR 79 | Temp 98.4°F | Resp 16 | Ht 68.0 in | Wt 167.0 lb

## 2016-06-12 DIAGNOSIS — F329 Major depressive disorder, single episode, unspecified: Secondary | ICD-10-CM

## 2016-06-12 DIAGNOSIS — I809 Phlebitis and thrombophlebitis of unspecified site: Secondary | ICD-10-CM

## 2016-06-12 DIAGNOSIS — F32A Depression, unspecified: Secondary | ICD-10-CM

## 2016-06-12 MED ORDER — ESCITALOPRAM OXALATE 20 MG PO TABS: 20.0000 mg | ORAL_TABLET | Freq: Every day | ORAL | 2 refills | Status: DC

## 2016-06-12 MED FILL — ?ESCITALOPRAM 20 MG TABLET: 20 | 30 days supply | Qty: 30 | Fill #0

## 2016-06-12 NOTE — Patient Instructions (Signed)
Major Depressive Disorder Major depressive disorder is a mental illness. It also may be called clinical depression or unipolar depression. Major depressive disorder usually causes feelings of sadness, hopelessness, or helplessness. Some people with this disorder do not feel particularly sad but lose interest in doing things they used to enjoy (anhedonia). Major depressive disorder also can cause physical symptoms. It can interfere with work, school, relationships, and other normal everyday activities. The disorder varies in severity but is longer lasting and more serious than the sadness we all feel from time to time in our lives. Major depressive disorder often is triggered by stressful life events or major life changes. Examples of these triggers include divorce, loss of your job or home, a move, and the death of a family member or close friend. Sometimes this disorder occurs for no obvious reason at all. People who have family members with major depressive disorder or bipolar disorder are at higher risk for developing this disorder, with or without life stressors. Major depressive disorder can occur at any age. It may occur just once in your life (single episode major depressive disorder). It may occur multiple times (recurrent major depressive disorder). SYMPTOMS People with major depressive disorder have either anhedonia or depressed mood on nearly a daily basis for at least 2 weeks or longer. Symptoms of depressed mood include:  Feelings of sadness (blue or down in the dumps) or emptiness.  Feelings of hopelessness or helplessness.  Tearfulness or episodes of crying (may be observed by others).  Irritability (children and adolescents). In addition to depressed mood or anhedonia or both, people with this disorder have at least four of the following symptoms:  Difficulty sleeping or sleeping too much.   Significant change (increase or decrease) in appetite or weight.   Lack of energy or  motivation.  Feelings of guilt and worthlessness.   Difficulty concentrating, remembering, or making decisions.  Unusually slow movement (psychomotor retardation) or restlessness (as observed by others).   Recurrent wishes for death, recurrent thoughts of self-harm (suicide), or a suicide attempt. People with major depressive disorder commonly have persistent negative thoughts about themselves, other people, and the world. People with severe major depressive disorder may experiencedistorted beliefs or perceptions about the world (psychotic delusions). They also may see or hear things that are not real (psychotic hallucinations). DIAGNOSIS Major depressive disorder is diagnosed through an assessment by your health care provider. Your health care provider will ask aboutaspects of your daily life, such as mood,sleep, and appetite, to see if you have the diagnostic symptoms of major depressive disorder. Your health care provider may ask about your medical history and use of alcohol or drugs, including prescription medicines. Your health care provider also may do a physical exam and blood work. This is because certain medical conditions and the use of certain substances can cause major depressive disorder-like symptoms (secondary depression). Your health care provider also may refer you to a mental health specialist for further evaluation and treatment. TREATMENT It is important to recognize the symptoms of major depressive disorder and seek treatment. The following treatments can be prescribed for this disorder:   Medicine. Antidepressant medicines usually are prescribed. Antidepressant medicines are thought to correct chemical imbalances in the brain that are commonly associated with major depressive disorder. Other types of medicine may be added if the symptoms do not respond to antidepressant medicines alone or if psychotic delusions or hallucinations occur.  Talk therapy. Talk therapy can be  helpful in treating major depressive disorder by providing   support, education, and guidance. Certain types of talk therapy also can help with negative thinking (cognitive behavioral therapy) and with relationship issues that trigger this disorder (interpersonal therapy). A mental health specialist can help determine which treatment is best for you. Most people with major depressive disorder do well with a combination of medicine and talk therapy. Treatments involving electrical stimulation of the brain can be used in situations with extremely severe symptoms or when medicine and talk therapy do not work over time. These treatments include electroconvulsive therapy, transcranial magnetic stimulation, and vagal nerve stimulation.   This information is not intended to replace advice given to you by your health care provider. Make sure you discuss any questions you have with your health care provider.   Document Released: 02/08/2013 Document Revised: 11/04/2014 Document Reviewed: 02/08/2013 Elsevier Interactive Patient Education 2016 Elsevier Inc.  

## 2016-06-12 NOTE — Progress Notes (Signed)
Pt is in the office today for depression Pt states his wrist is still oozing stuff out Pt states he is not in any pain

## 2016-06-12 NOTE — Progress Notes (Signed)
Ricky [name], is a 40 y.o. male  JYN:[id number]:[id number]  [id number]  DOB - 02/13/1976  Chief Complaint  Patient presents with  . Depression        Subjective:   Ricky [name] is a 40 y.o. male here today for a follow up visit For depression, last seen in clinic 05/14/2016. Since he started the Lexapro 10 mg daily, he'snoticed slight improvement in his mood. Not as depressed as prior.  NO si/ha/avh.  He states his daughter has noticed his mood has improved slightly as well.  He also notes that at the sight of the craterization on his right wrist, there has been some drainage whenever he pushes on it. No f/c.  Patient has No headache, No chest pain, No abdominal pain - No Nausea, No new weakness tingling or numbness, No Cough - SOB.  No problems updated.  ALLERGIES: No Known Allergies  PAST MEDICAL HISTORY: Past Medical History:  Diagnosis Date  . Headache   . NSTEMI (non-ST elevated myocardial infarction) (HCC) 04/2016   PCI to the mid LAD; normal LV function  . Pneumothorax   . Tobacco abuse     MEDICATIONS AT HOME: Prior to Admission medications   Medication Sig Start Date End Date Taking? Authorizing Provider  AMBULATORY NON FORMULARY MEDICATION Take 90 mg by mouth 2 (two) times daily. Medication Name: Brilinta 90 mg BID (TWILIGHT Research Study Provided Do not fill) 05/03/16  Yes Kathleene Hazelhristopher D McAlhany, MD  AMBULATORY NON FORMULARY MEDICATION Take 81 mg by mouth daily. Medication Name: Aspirin 81 mg Daily (TWILIGHT Research study Provided) 05/08/16  Yes Kathleene Hazelhristopher D McAlhany, MD  atorvastatin (LIPITOR) 80 MG tablet Take 1 tablet (80 mg total) by mouth daily at 6 PM. 05/14/16  Yes Pete Glatterawn T Wadsworth Skolnick, MD  nitroGLYCERIN (NITROSTAT) 0.4 MG SL tablet Place 1 tablet (0.4 mg total) under the tongue every 5 (five) minutes as needed for chest pain. 05/03/16  Yes Rolly SalterPranav M Patel, MD  escitalopram (LEXAPRO) 20 MG tablet Take 1 tablet (20 mg total) by mouth daily. 06/12/16   Pete Glatterawn T  Carle Dargan, MD     Objective:   Vitals:   06/12/16 1208  BP: 126/81  Pulse: 79  Resp: 16  Temp: 98.4 F (36.9 C)  TempSrc: Oral  SpO2: 97%  Weight: 167 lb (75.8 kg)  Height: 5\' 8"  (1.727 m)    Exam General appearance : Awake, alert, not in any distress. Speech Clear. Not toxic looking, pleasant,  Good eye contact HEENT: Atraumatic and Normocephalic,  Neck: supple, no JVD.  Chest:Good air entry bilaterally, no added sounds. CVS: S1 S2 regular, no murmurs/gallups or rubs. Abdomen: Bowel sounds active, Non tender and not distended with no gaurding, rigidity or rebound. Extremities: B/L Lower Ext shows no edema, both legs are warm to touch Right wrist - small area about 2mm from prior catherization site that is slightly erythematous, no induration/flutulance noted, but was aspirate small amount of serous fluid w/ pressure. Neurology: Awake alert, and oriented X 3, CN II-XII grossly intact, Non focal Skin:No Rash  Data Review Lab Results  Component Value Date   HGBA1C 5.2 04/20/2013    Depression screen [facility] 2/9 06/12/2016 05/14/2016  Decreased Interest 2 1  Down, Depressed, Hopeless 2 2  PHQ - 2 Score 4 3  Altered sleeping 2 1  Tired, decreased energy 1 2  Change in appetite 1 1  Feeling bad or failure about yourself  2 2  Trouble concentrating 1 1  Moving slowly or fidgety/restless  1 1  Suicidal thoughts 0 0  PHQ-9 Score 12 11  Difficult doing work/chores - Somewhat difficult      Assessment & Plan   1. Depression - improving slightly w/ lexapro 10, will increase to lexapro 20 qdy  2. Mild phlebitis, from site of cath insertion (for coronary cath last month) - recd triple abx to site (gave pt some small samples), keep clean/covered to  Patient have been counseled extensively about nutrition and exercise  Return in about 3 months (around 09/12/2016), or if symptoms worsen or fail to improve.  The patient was given clear instructions to go to ER or return to  medical center if symptoms don't improve, worsen or new problems develop. The patient verbalized understanding. The patient was told to call to get lab results if they haven't heard anything in the next week.   This note has been created with Education officer, environmentalDragon speech recognition software and smart phrase technology. Any transcriptional errors are unintentional.   Pete Glatterawn T Elzie Knisley, MD, MBA/MHA [facility] [place], [place] [phone]   06/12/2016, 1:29 PM

## 2016-07-03 MED FILL — ?ESCITALOPRAM 20 MG TABLET: 20 | 30 days supply | Qty: 30 | Fill #1

## 2016-07-03 MED FILL — ATORVASTATIN 80 MG TABLET: 80 | 30 days supply | Qty: 30 | Fill #1

## 2016-07-23 ENCOUNTER — Other Ambulatory Visit: Payer: Self-pay | Admitting: *Deleted

## 2016-07-23 DIAGNOSIS — E785 Hyperlipidemia, unspecified: Secondary | ICD-10-CM

## 2016-07-23 LAB — LIPID PANEL
CHOLESTEROL: 168 mg/dL (ref 125–200)
HDL: 50 mg/dL (ref >=40)
LDL Cholesterol: 82 mg/dL (ref <130)
TRIGLYCERIDES: 181 mg/dL — AB (ref <150)
Total CHOL/HDL Ratio: 3.4 Ratio (ref <=5.0)
VLDL: 36 mg/dL — AB (ref <30)

## 2016-07-23 LAB — HEPATIC FUNCTION PANEL
ALK PHOS: 51 U/L (ref 40–115)
ALT: 35 U/L (ref 9–46)
AST: 28 U/L (ref 10–40)
Albumin: 4.3 g/dL (ref 3.6–5.1)
BILIRUBIN INDIRECT: 0.6 mg/dL (ref 0.2–1.2)
Bilirubin, Direct: 0.2 mg/dL (ref <=0.2)
TOTAL PROTEIN: 6.4 g/dL (ref 6.1–8.1)
Total Bilirubin: 0.8 mg/dL (ref 0.2–1.2)

## 2016-08-02 ENCOUNTER — Encounter: Payer: Self-pay | Admitting: *Deleted

## 2016-08-02 ENCOUNTER — Other Ambulatory Visit: Payer: Self-pay | Admitting: *Deleted

## 2016-08-02 DIAGNOSIS — Z006 Encounter for examination for normal comparison and control in clinical research program: Secondary | ICD-10-CM

## 2016-08-02 MED ORDER — AMBULATORY NON FORMULARY MEDICATION: 81.0000 mg | Freq: Every day | Status: DC

## 2016-08-02 NOTE — Progress Notes (Signed)
TWILIGHT Research study month 3 randomization visit completed. Patient denies any bleeding or other adverse events. He states he has been compliant with medications. ASA/Placebo and Brilinta dispensed. Questions encouraged and answered.

## 2016-08-06 MED FILL — ATORVASTATIN 80 MG TABLET: 80 | 30 days supply | Qty: 30 | Fill #2

## 2016-08-06 MED FILL — ?ESCITALOPRAM 20 MG TABLET: 20 | 30 days supply | Qty: 30 | Fill #2

## 2016-08-26 ENCOUNTER — Ambulatory Visit (HOSPITAL_COMMUNITY): Payer: Self-pay | Attending: Cardiology

## 2016-08-26 ENCOUNTER — Ambulatory Visit (INDEPENDENT_AMBULATORY_CARE_PROVIDER_SITE_OTHER): Payer: Self-pay | Admitting: Physician Assistant

## 2016-08-26 ENCOUNTER — Encounter: Payer: Self-pay | Admitting: Physician Assistant

## 2016-08-26 ENCOUNTER — Telehealth: Payer: Self-pay | Admitting: Cardiovascular Disease

## 2016-08-26 ENCOUNTER — Telehealth: Payer: Self-pay | Admitting: Internal Medicine

## 2016-08-26 VITALS — BP 132/72 | HR 49 | Ht 68.0 in | Wt 175.0 lb

## 2016-08-26 DIAGNOSIS — I2510 Atherosclerotic heart disease of native coronary artery without angina pectoris: Secondary | ICD-10-CM

## 2016-08-26 DIAGNOSIS — R202 Paresthesia of skin: Secondary | ICD-10-CM

## 2016-08-26 DIAGNOSIS — Z720 Tobacco use: Secondary | ICD-10-CM

## 2016-08-26 DIAGNOSIS — R001 Bradycardia, unspecified: Secondary | ICD-10-CM

## 2016-08-26 DIAGNOSIS — I959 Hypotension, unspecified: Secondary | ICD-10-CM

## 2016-08-26 LAB — EXERCISE TOLERANCE TEST
CHL CUP MPHR: 182 {beats}/min
CHL RATE OF PERCEIVED EXERTION: 17
CSEPEDS: 0 s
CSEPHR: 90 %
Estimated workload: 13.4 METS
Exercise duration (min): 12 min
Peak HR: 164 {beats}/min
Rest HR: 56 {beats}/min

## 2016-08-26 NOTE — Telephone Encounter (Signed)
Great. THanks. chris

## 2016-08-26 NOTE — Telephone Encounter (Signed)
Will forward to pcp

## 2016-08-26 NOTE — Telephone Encounter (Signed)
Pt states that for a week or more he has noticed some numbness in his finger tips.  This is occurring in both hands.  Sometimes it's one hand at a time and other times it is both hands.  Denies CP, lightheadedness, dizziness, N/V.  States he had some slight SOB over the weekend while cleaning his house but none today or any other time this has occurred.  States it lasts 1-2 mins and resolves and it just occurs at random times.  No vitals available.  Pt states he was having this same symptom for 3-4 weeks prior to having his heart attack in July.  Advised pt that I will send message to Dr. Clifton JamesMcAlhany for review and advisement.  Also advised pt if other symptoms develop that he needed to report to ER.  Pt verbalized understanding and was in agreement with this plan.

## 2016-08-26 NOTE — Telephone Encounter (Signed)
Pt having numbness in finger tips about a week now-off and on-pls advise [phone]

## 2016-08-26 NOTE — Telephone Encounter (Signed)
Ricky DikeJennifer, Can we get him in to see an APP this week? Thanks, chris

## 2016-08-26 NOTE — Telephone Encounter (Signed)
Pt calling stating he is experiencing numbness and needle like pain in his fingers and both hands Pt states he had these symptoms before his first heart attack so he went to see his cardiologist today and was told these symptoms did not correspond with his heart  Pt was scheduled an appointment with PCP for this Wednesday but pt is requesting to speak to nurse or PCP before to see if an appointment is necessary
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
## 2016-08-26 NOTE — Patient Instructions (Signed)
Medication Instructions:  Your physician recommends that you continue on your current medications as directed. Please refer to the Current Medication list given to you today.   Labwork: None ordered  Testing/Procedures: Your physician has requested that you have an STAT  exercise tolerance test. For further information please visit [email or web address]. Please also follow instruction sheet, as given.   Follow-Up: Your physician recommends that you schedule a follow-up appointment in: 2 WEEKS WITH DR. Clifton JamesMCALHANY OR DAYNA DUNN, PA-C   Any Other Special Instructions Will Be Listed Below (If Applicable).   Exercise Stress Electrocardiogram An exercise stress electrocardiogram is a test to check how blood flows to your heart. It is done to find areas of poor blood flow. You will need to walk on a treadmill for this test. The electrocardiogram will record your heartbeat when you are at rest and when you are exercising. BEFORE THE PROCEDURE  Do not have drinks with caffeine or foods with caffeine for 24 hours before the test, or as told by your doctor. This includes coffee, tea (even decaf tea), sodas, chocolate, and cocoa.  Follow your doctor's instructions about eating and drinking before the test.  Ask your doctor what medicines you should or should not take before the test. Take your medicines with water unless told by your doctor not to.  If you use an inhaler, bring it with you to the test.  Bring a snack to eat after the test.  Do not  smoke for 4 hours before the test.  Do not put lotions, powders, creams, or oils on your chest before the test.  Wear comfortable shoes and clothing. PROCEDURE  You will have patches put on your chest. Small areas of your chest may need to be shaved. Wires will be connected to the patches.  Your heart rate will be watched while you are resting and while you are exercising.  You will walk on the treadmill. The treadmill will slowly get faster to  raise your heart rate.  The test will take about 1-2 hours. AFTER THE PROCEDURE  Your heart rate and blood pressure will be watched after the test.  You may return to your normal diet, activities, and medicines or as told by your doctor.   This information is not intended to replace advice given to you by your health care provider. Make sure you discuss any questions you have with your health care provider.   Document Released: 04/01/2008 Document Revised: 11/04/2014 Document Reviewed: 06/21/2013 Elsevier Interactive Patient Education [employer]2016 Elsevier Inc.   If you need a refill on your cardiac medications before your next appointment, please call your pharmacy.

## 2016-08-26 NOTE — Telephone Encounter (Signed)
Spoke with pt and informed him that Dr. Clifton JamesMcAlhany would like for him to see APP this week.  Pt off work today and would prefer to be seen today. Spoke with Ronie Spiesayna Dunn, PA-C and she said ok to add pt at 1:30pm.  Pt agreeable to appt.

## 2016-08-27 NOTE — Telephone Encounter (Signed)
Called pt. Confirmed dob.  Tingling hands intermittant, bilat. Sounds like pinched nerve. Has appt w/ me tomorw.

## 2016-08-28 ENCOUNTER — Ambulatory Visit: Payer: Self-pay | Attending: Internal Medicine | Admitting: Internal Medicine

## 2016-08-28 ENCOUNTER — Encounter: Payer: Self-pay | Admitting: Internal Medicine

## 2016-08-28 VITALS — BP 113/69 | HR 59 | Temp 98.1°F | Ht 68.0 in | Wt 177.0 lb

## 2016-08-28 DIAGNOSIS — Z79899 Other long term (current) drug therapy: Secondary | ICD-10-CM | POA: Insufficient documentation

## 2016-08-28 DIAGNOSIS — I2510 Atherosclerotic heart disease of native coronary artery without angina pectoris: Secondary | ICD-10-CM | POA: Insufficient documentation

## 2016-08-28 DIAGNOSIS — R200 Anesthesia of skin: Secondary | ICD-10-CM | POA: Insufficient documentation

## 2016-08-28 DIAGNOSIS — Z23 Encounter for immunization: Secondary | ICD-10-CM

## 2016-08-28 DIAGNOSIS — G729 Myopathy, unspecified: Secondary | ICD-10-CM

## 2016-08-28 DIAGNOSIS — I252 Old myocardial infarction: Secondary | ICD-10-CM | POA: Insufficient documentation

## 2016-08-28 DIAGNOSIS — E785 Hyperlipidemia, unspecified: Secondary | ICD-10-CM | POA: Insufficient documentation

## 2016-08-28 MED ORDER — ATORVASTATIN CALCIUM 80 MG PO TABS: 80.0000 mg | ORAL_TABLET | Freq: Every day | ORAL | 3 refills | Status: DC

## 2016-08-28 MED ORDER — ESCITALOPRAM OXALATE 20 MG PO TABS: 20.0000 mg | ORAL_TABLET | Freq: Every day | ORAL | 2 refills | Status: DC

## 2016-08-28 MED FILL — ATORVASTATIN 80 MG TABLET: 80 | 30 days supply | Qty: 30 | Fill #0

## 2016-08-28 MED FILL — ?ESCITALOPRAM 20 MG TABLET: 20 | 30 days supply | Qty: 30 | Fill #0

## 2016-08-28 NOTE — Patient Instructions (Signed)
Tdap Vaccine (Tetanus, Diphtheria and Pertussis): What You Need to Know 1. Why get vaccinated? Tetanus, diphtheria and pertussis are very serious diseases. Tdap vaccine can protect us from these diseases. And, Tdap vaccine given to pregnant women can protect newborn babies against pertussis. TETANUS (Lockjaw) is rare in the [place] today. It causes painful muscle tightening and stiffness, usually all over the body.  It can lead to tightening of muscles in the head and neck so you can't open your mouth, swallow, or sometimes even breathe. Tetanus kills about 1 out of 10 people who are infected even after receiving the best medical care. DIPHTHERIA is also rare in the [place] today. It can cause a thick coating to form in the back of the throat.  It can lead to breathing problems, heart failure, paralysis, and death. PERTUSSIS (Whooping Cough) causes severe coughing spells, which can cause difficulty breathing, vomiting and disturbed sleep.  It can also lead to weight loss, incontinence, and rib fractures. Up to 2 in 100 adolescents and 5 in 100 adults with pertussis are hospitalized or have complications, which could include pneumonia or death. These diseases are caused by bacteria. Diphtheria and pertussis are spread from person to person through secretions from coughing or sneezing. Tetanus enters the body through cuts, scratches, or wounds. Before vaccines, as many as 200,000 cases of diphtheria, 200,000 cases of pertussis, and hundreds of cases of tetanus, were reported in the [place] each year. Since vaccination began, reports of cases for tetanus and diphtheria have dropped by about 99% and for pertussis by about 80%. 2. Tdap vaccine Tdap vaccine can protect adolescents and adults from tetanus, diphtheria, and pertussis. One dose of Tdap is routinely given at age 11 or 12. People who did not get Tdap at that age should get it as soon as possible. Tdap is especially important  for healthcare professionals and anyone having close contact with a baby younger than 12 months. Pregnant women should get a dose of Tdap during every pregnancy, to protect the newborn from pertussis. Infants are most at risk for severe, life-threatening complications from pertussis. Another vaccine, called Td, protects against tetanus and diphtheria, but not pertussis. A Td booster should be given every 10 years. Tdap may be given as one of these boosters if you have never gotten Tdap before. Tdap may also be given after a severe cut or burn to prevent tetanus infection. Your doctor or the person giving you the vaccine can give you more information. Tdap may safely be given at the same time as other vaccines. 3. Some people should not get this vaccine  A person who has ever had a life-threatening allergic reaction after a previous dose of any diphtheria, tetanus or pertussis containing vaccine, OR has a severe allergy to any part of this vaccine, should not get Tdap vaccine. Tell the person giving the vaccine about any severe allergies.  Anyone who had coma or long repeated seizures within 7 days after a childhood dose of DTP or DTaP, or a previous dose of Tdap, should not get Tdap, unless a cause other than the vaccine was found. They can still get Td.  Talk to your doctor if you:  have seizures or another nervous system problem,  had severe pain or swelling after any vaccine containing diphtheria, tetanus or pertussis,  ever had a condition called Guillain-Barr Syndrome (GBS),  aren't feeling well on the day the shot is scheduled. 4. Risks With any medicine, including vaccines, there is   a chance of side effects. These are usually mild and go away on their own. Serious reactions are also possible but are rare. Most people who get Tdap vaccine do not have any problems with it. Mild problems following Tdap (Did not interfere with activities)  Pain where the shot was given (about 3 in 4  adolescents or 2 in 3 adults)  Redness or swelling where the shot was given (about 1 person in 5)  Mild fever of at least 100.4F (up to about 1 in 25 adolescents or 1 in 100 adults)  Headache (about 3 or 4 people in 10)  Tiredness (about 1 person in 3 or 4)  Nausea, vomiting, diarrhea, stomach ache (up to 1 in 4 adolescents or 1 in 10 adults)  Chills, sore joints (about 1 person in 10)  Body aches (about 1 person in 3 or 4)  Rash, swollen glands (uncommon) Moderate problems following Tdap (Interfered with activities, but did not require medical attention)  Pain where the shot was given (up to 1 in 5 or 6)  Redness or swelling where the shot was given (up to about 1 in 16 adolescents or 1 in 12 adults)  Fever over 102F (about 1 in 100 adolescents or 1 in 250 adults)  Headache (about 1 in 7 adolescents or 1 in 10 adults)  Nausea, vomiting, diarrhea, stomach ache (up to 1 or 3 people in 100)  Swelling of the entire arm where the shot was given (up to about 1 in 500). Severe problems following Tdap (Unable to perform usual activities; required medical attention)  Swelling, severe pain, bleeding and redness in the arm where the shot was given (rare). Problems that could happen after any vaccine:  People sometimes faint after a medical procedure, including vaccination. Sitting or lying down for about 15 minutes can help prevent fainting, and injuries caused by a fall. Tell your doctor if you feel dizzy, or have vision changes or ringing in the ears.  Some people get severe pain in the shoulder and have difficulty moving the arm where a shot was given. This happens very rarely.  Any medication can cause a severe allergic reaction. Such reactions from a vaccine are very rare, estimated at fewer than 1 in a million doses, and would happen within a few minutes to a few hours after the vaccination. As with any medicine, there is a very remote chance of a vaccine causing a serious  injury or death. The safety of vaccines is always being monitored. For more information, visit: www.cdc.gov/vaccinesafety/ 5. What if there is a serious problem? What should I look for?  Look for anything that concerns you, such as signs of a severe allergic reaction, very high fever, or unusual behavior.  Signs of a severe allergic reaction can include hives, swelling of the face and throat, difficulty breathing, a fast heartbeat, dizziness, and weakness. These would usually start a few minutes to a few hours after the vaccination. What should I do?  If you think it is a severe allergic reaction or other emergency that can't wait, call 9-1-1 or get the person to the nearest hospital. Otherwise, call your doctor.  Afterward, the reaction should be reported to the Vaccine Adverse Event Reporting System (VAERS). Your doctor might file this report, or you can do it yourself through the VAERS web site at www.vaers.hhs.gov, or by calling 1-800-822-7967. VAERS does not give medical advice.  6. The National Vaccine Injury Compensation Program The National Vaccine Injury Compensation Program (  VICP) is a federal program that was created to compensate people who may have been injured by certain vaccines. Persons who believe they may have been injured by a vaccine can learn about the program and about filing a claim by calling 1-800-338-2382 or visiting the VICP website at www.hrsa.gov/vaccinecompensation. There is a time limit to file a claim for compensation. 7. How can I learn more?  Ask your doctor. He or she can give you the vaccine package insert or suggest other sources of information.  Call your local or state health department.  Contact the Centers for Disease Control and Prevention (CDC):  Call 1-800-232-4636 (1-800-CDC-INFO) or  Visit CDC's website at www.cdc.gov/vaccines CDC Tdap Vaccine VIS (12/21/13)   This information is not intended to replace advice given to you by your health care  provider. Make sure you discuss any questions you have with your health care provider.   Document Released: 04/14/2012 Document Revised: 11/04/2014 Document Reviewed: 01/26/2014 Elsevier Interactive Patient Education 2016 Elsevier Inc. Influenza Virus Vaccine injection (Fluarix) What is this medicine? INFLUENZA VIRUS VACCINE (in floo EN zuh VAHY ruhs vak SEEN) helps to reduce the risk of getting influenza also known as the flu. This medicine may be used for other purposes; ask your health care provider or pharmacist if you have questions. What should I tell my health care provider before I take this medicine? They need to know if you have any of these conditions: -bleeding disorder like hemophilia -fever or infection -Guillain-Barre syndrome or other neurological problems -immune system problems -infection with the human immunodeficiency virus (HIV) or AIDS -low blood platelet counts -multiple sclerosis -an unusual or allergic reaction to influenza virus vaccine, eggs, chicken proteins, latex, gentamicin, other medicines, foods, dyes or preservatives -pregnant or trying to get pregnant -breast-feeding How should I use this medicine? This vaccine is for injection into a muscle. It is given by a health care professional. A copy of Vaccine Information Statements will be given before each vaccination. Read this sheet carefully each time. The sheet may change frequently. Talk to your pediatrician regarding the use of this medicine in children. Special care may be needed. Overdosage: If you think you have taken too much of this medicine contact a poison control center or emergency room at once. NOTE: This medicine is only for you. Do not share this medicine with others. What if I miss a dose? This does not apply. What may interact with this medicine? -chemotherapy or radiation therapy -medicines that lower your immune system like etanercept, anakinra, infliximab, and adalimumab -medicines  that treat or prevent blood clots like warfarin -phenytoin -steroid medicines like prednisone or cortisone -theophylline -vaccines This list may not describe all possible interactions. Give your health care provider a list of all the medicines, herbs, non-prescription drugs, or dietary supplements you use. Also tell them if you smoke, drink alcohol, or use illegal drugs. Some items may interact with your medicine. What should I watch for while using this medicine? Report any side effects that do not go away within 3 days to your doctor or health care professional. Call your health care provider if any unusual symptoms occur within 6 weeks of receiving this vaccine. You may still catch the flu, but the illness is not usually as bad. You cannot get the flu from the vaccine. The vaccine will not protect against colds or other illnesses that may cause fever. The vaccine is needed every year. What side effects may I notice from receiving this medicine? Side effects that   you should report to your doctor or health care professional as soon as possible: -allergic reactions like skin rash, itching or hives, swelling of the face, lips, or tongue Side effects that usually do not require medical attention (report to your doctor or health care professional if they continue or are bothersome): -fever -headache -muscle aches and pains -pain, tenderness, redness, or swelling at site where injected -weak or tired This list may not describe all possible side effects. Call your doctor for medical advice about side effects. You may report side effects to FDA at 1-800-FDA-1088. Where should I keep my medicine? This vaccine is only given in a clinic, pharmacy, doctor's office, or other health care setting and will not be stored at home. NOTE: This sheet is a summary. It may not cover all possible information. If you have questions about this medicine, talk to your doctor, pharmacist, or health care provider.     2016, Elsevier/Gold Standard. (2008-05-11 09:30:40)  

## 2016-08-28 NOTE — Progress Notes (Signed)
Ricky [name], is a 40 y.o. male  ZOX:[id number]:[id number]  [id number]:[id number]  DOB - 11/24/1975  Chief Complaint  Patient presents with  . Numbness    fingers and hands        Subjective:   Ricky [name] is a 40 y.o. male here today for a follow up visit.  Patient complains of intermittent upper extremity bilateral numbness and tingling at times intermittent and sometimes one-sided symptoms. The other. It could happen spontaneously when he's walking or sitting down. Nothing makes it worse or better. This occurred prior to his heart attack and he thought was a heart exercise cardiologist recently, but everything worked up with negative for cardiac standpoint. He denies any visual changes, headaches, nausea or vomiting, fevers or chills. No other complaints today.   Of note he says his depression is a lot better than prior. He is tolerating Lexapro 20 daily without any issues. he states his daughter has noticed an improvement as well.  Denies SI/Hi/ auditory, visual hallucinations  Patient has No headache, No chest pain, No abdominal pain - No Nausea, No new weakness tingling or numbness, No Cough - SOB.  No problems updated.  ALLERGIES: No Known Allergies  PAST MEDICAL HISTORY: Past Medical History:  Diagnosis Date  . CAD in native artery    a. NSTEMI 04/2016 s/p DES to mLAD- Twilight study, no sig disease otherwise.  Marland Kitchen. Headache   . Hyperlipidemia   . Hypotension   . NSTEMI (non-ST elevated myocardial infarction) (HCC) 04/2016   PCI to the mid LAD; normal LV function  . Pneumothorax   . Sinus bradycardia   . Tobacco abuse     MEDICATIONS AT HOME: Prior to Admission medications   Medication Sig Start Date End Date Taking? Authorizing Provider  AMBULATORY NON FORMULARY MEDICATION Take 90 mg by mouth 2 (two) times daily. Medication Name: Brilinta 90 mg BID (TWILIGHT Research Study Provided Do not fill) 05/03/16  Yes Kathleene Hazelhristopher D McAlhany, MD  AMBULATORY NON FORMULARY MEDICATION Take 81  mg by mouth daily. Medication Name: Aspirin 81 mg daily or PLACEBO (TWILIGHT Research study) 08/02/16  Yes Kathleene Hazelhristopher D McAlhany, MD  atorvastatin (LIPITOR) 80 MG tablet Take 1 tablet (80 mg total) by mouth daily at 6 PM. 08/28/16  Yes Pete Glatterawn T Amos Micheals, MD  escitalopram (LEXAPRO) 20 MG tablet Take 1 tablet (20 mg total) by mouth daily. 08/28/16  Yes Evangelina Delancey Marland Mcalpine Aarin Bluett, MD  nitroGLYCERIN (NITROSTAT) 0.4 MG SL tablet Place 1 tablet (0.4 mg total) under the tongue every 5 (five) minutes as needed for chest pain. 05/03/16  Yes Rolly SalterPranav M Patel, MD     Objective:   Vitals:   08/28/16 1038  BP: 113/69  Pulse: (!) 59  Temp: 98.1 F (36.7 C)  TempSrc: Oral  SpO2: 96%  Weight: 177 lb (80.3 kg)  Height: 5\' 8"  (1.727 m)    Exam General appearance : Awake, alert, not in any distress. Speech Clear. Not toxic looking, pleasant. Good eye contact HEENT: Atraumatic and Normocephalic, pupils equally reactive to light. Neck: supple, no JVD. Range of motion intact in the neck. All directions with no recurrence of tingling and numbness on extension, flexion, rotation of the neck. Tenderness on the neck on palpation throughout the cervical region. Chest:Good air entry bilaterally, no added sounds. CVS: S1 S2 regular, no murmurs/gallups or rubs. Extremities: B/L Lower Ext shows no edema, both legs are warm to touch.  No reproducible numbness and tingling of the hands  On flexion, extension of the  hands, Range of motion intact throughout the shoulders and hands . No reproducible numbness, tingling. Neurology: Awake alert, and oriented X 3, CN II-XII grossly intact, Non focal Skin:No Rash  Data Review Lab Results  Component Value Date   HGBA1C 5.2 04/20/2013    Depression screen [facility] 2/9 06/12/2016 05/14/2016  Decreased Interest 2 1  Down, Depressed, Hopeless 2 2  PHQ - 2 Score 4 3  Altered sleeping 2 1  Tired, decreased energy 1 2  Change in appetite 1 1  Feeling bad or failure about yourself  2 2  Trouble  concentrating 1 1  Moving slowly or fidgety/restless 1 1  Suicidal thoughts 0 0  PHQ-9 Score 12 11  Difficult doing work/chores - Somewhat difficult    Mri c/t spine 04/2007 IMPRESSION:                                                                                                                           1.  Idiopathic syrinx C2-C7; this is relatively mild and not associated with foramen magnum lesion, intraspinal mass, cord enlargement or postcontrast enhancement.  2.  There is no significant change from 02/05/07. MRI OF THORACIC SPINE WITHOUT AND WITH CONTRAST: Technique:  Multiplanar and multiecho pulse sequences of the thoracic spine were obtained according to standard protocol before and after administration of intravenous contrast. Contrast:  15 mL Magnevist. Findings:  No evidence for disc degeneration, disc herniation, vertebral body abnormality, or paraspinous mass.  The cord has a normal size and signal throughout. The syrinx terminates at the C7-T1 interspace and does not extend into the thoracic cord or conus. Post-infusion, there is no abnormal enhancement. IMPRESSION:                                                                                                                           Negative thoracic spine MRI without and with contrast.  Provider: Lurlean Leyden  01/2007 mri brain IMPRESSION:   1. Dilatation of the central canal compatible with a syrinx extending from mid body of C2 through C7. There is no associated mass lesion. This may be congenital or related to prior trauma.   2. Mild prominence of soft tissue surrounding the dens as described in MRI of the brain. There is slight kinking at the cervicomedullary junction. This may be related to prior trauma or inflammatory disease.   3. Prominent nasopharyngeal soft tissue. While this can be seen in  patients of this age, autoimmune diseases are also considered.   4. Mild disc bulging without significant stenosis at any  level.    Provider: Council Mechanicharla Roby Assessment & Plan   1. Cervical myopathy, w/ intermittent numbness/tingling - not cardiac. - hx of cervical c2-c7 idiopathic syrix in prior mri, repeat to ensure no cord impingment, acute changes. - MR Cervical Spine Wo Contrast; Future     Patient have been counseled extensively about nutrition and exercise  No Follow-up on file.  The patient was given clear instructions to go to ER or return to medical center if symptoms don't improve, worsen or new problems develop. The patient verbalized understanding. The patient was told to call to get lab results if they haven't heard anything in the next week.   This note has been created with Education officer, environmentalDragon speech recognition software and smart phrase technology. Any transcriptional errors are unintentional.   Pete Glatterawn T Rowen Hur, MD, MBA/MHA [facility] [place], [place] [phone]   08/28/2016, 1:22 PM

## 2016-08-28 NOTE — Progress Notes (Signed)
Pt is here today for numbness in fingers and hands.

## 2016-09-03 ENCOUNTER — Ambulatory Visit (HOSPITAL_COMMUNITY)
Admission: RE | Admit: 2016-09-03 | Discharge: 2016-09-03 | Disposition: A | Discharge status: Home / Self Care | Payer: Self-pay | Source: Ambulatory Visit | Attending: Internal Medicine | Admitting: Internal Medicine

## 2016-09-03 ENCOUNTER — Encounter: Payer: Self-pay | Admitting: *Deleted

## 2016-09-03 DIAGNOSIS — G729 Myopathy, unspecified: Secondary | ICD-10-CM | POA: Insufficient documentation

## 2016-09-03 DIAGNOSIS — Z006 Encounter for examination for normal comparison and control in clinical research program: Secondary | ICD-10-CM

## 2016-09-03 DIAGNOSIS — G950 Syringomyelia and syringobulbia: Secondary | ICD-10-CM | POA: Insufficient documentation

## 2016-09-03 DIAGNOSIS — M479 Spondylosis, unspecified: Secondary | ICD-10-CM | POA: Insufficient documentation
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
## 2016-09-04 ENCOUNTER — Other Ambulatory Visit: Payer: Self-pay | Admitting: Internal Medicine

## 2016-09-04 DIAGNOSIS — M4712 Other spondylosis with myelopathy, cervical region: Secondary | ICD-10-CM

## 2016-09-04 DIAGNOSIS — R7989 Other specified abnormal findings of blood chemistry: Principal | ICD-10-CM

## 2016-09-04 DIAGNOSIS — R778 Other specified abnormalities of plasma proteins: Secondary | ICD-10-CM

## 2016-09-09 ENCOUNTER — Ambulatory Visit: Payer: Self-pay | Admitting: Physician Assistant

## 2016-09-11 ENCOUNTER — Other Ambulatory Visit: Payer: Self-pay | Admitting: Internal Medicine

## 2016-09-11 ENCOUNTER — Telehealth: Payer: Self-pay | Admitting: Internal Medicine

## 2016-09-11 DIAGNOSIS — M4712 Other spondylosis with myelopathy, cervical region: Secondary | ICD-10-CM

## 2016-09-11 NOTE — Telephone Encounter (Signed)
Called pt, confirmed dob.  pt was referred to nsg, but couldn't afford copay.  Will also place ortho referral.  Still does not have orange card / cone discount, encouraged pt to apply. Answered all questions.

## 2016-10-03 MED FILL — ATORVASTATIN 80 MG TABLET: 80 | 30 days supply | Qty: 30 | Fill #1

## 2016-10-03 MED FILL — ?ESCITALOPRAM 20 MG TABLET: 20 | 30 days supply | Qty: 30 | Fill #1

## 2016-10-07 ENCOUNTER — Ambulatory Visit: Payer: Self-pay | Admitting: Physician Assistant

## 2016-10-08 ENCOUNTER — Ambulatory Visit (INDEPENDENT_AMBULATORY_CARE_PROVIDER_SITE_OTHER): Payer: Self-pay | Admitting: Cardiology

## 2016-10-08 ENCOUNTER — Encounter: Payer: Self-pay | Admitting: Cardiology

## 2016-10-08 VITALS — BP 120/74 | HR 86 | Ht 68.0 in | Wt 176.6 lb

## 2016-10-08 DIAGNOSIS — I2510 Atherosclerotic heart disease of native coronary artery without angina pectoris: Secondary | ICD-10-CM

## 2016-10-08 NOTE — Patient Instructions (Signed)
Medication Instructions:  The current medical regimen is effective;  continue present plan and medications.  Follow-Up: Follow up with Dr Clifton JamesMcAlhany in 3 months.  If you need a refill on your cardiac medications before your next appointment, please call your pharmacy.  Thank you for choosing [facility]!!

## 2016-10-08 NOTE — Progress Notes (Signed)
10/08/2016 Ricky [name]   03/03/1976  [id number]  Primary Physician Ricky Glatterawn T Langeland, MD Primary Cardiologist: Dr. Clifton [name]    Reason for Visit/CC: numbness in fingers  HPI:  40 y.o. male with history of CAD (NSTEMI 04/2016 s/p DES to mLAD- Twilight study, no sig disease otherwise), tobacco abuse, depression, hyperlipidemia, not on BB due to baseline hypotension/bradycardia who presents for evaluation of finger numbness. 2D echo 05/02/16: EF 55-60%, no RMWA, normal diastolic parameters. For the past several weeks he's had intermittent episodes of finger tingling. He was evaluted by Ricky Spiesayna Dunn, PA-C on 08/26/16 for this. He noted his tingling in his hands reminds him of what he had several weeks before his MI. Per office note, physical exam and EKG were both unremarkable. He was ordered to undergo an ETT. This was performed on 08/26/16 and was negative for ischemia. walked for 12 minutes of a standard Bruce protocol GXT . Peak HR of 182 which is 90% maximal predicted HR. BP response was normal. No ST or T wave changes. This was ruled a normal study.  Since then, he had f/u with is PCP. He underwent a MRI of his cervical spine, revealing stable small syrinx extending from C2 to the C7 level. Mild progression in cervical spondylosis from 2008 with multilevel disc bulges and endplate degenerative changes. Spondylosis is greatest at C5-6 with there is mild right and moderate left foraminal narrowing. No significant canal stenosis. It is felt that he has a pinched nerve which is likely the cause of his symptoms.   He denies any anginal symptoms. He reports full medication compliance with ASA + Brilinta. VSS. He unfortunately resumed smoking again.    Current Meds  Medication Sig  . AMBULATORY NON FORMULARY MEDICATION Take 90 mg by mouth 2 (two) times daily. Medication Name: Brilinta 90 mg BID (TWILIGHT Research Study Provided Do not fill)  . AMBULATORY NON FORMULARY MEDICATION Take 81 mg by mouth  daily. Medication Name: Aspirin 81 mg daily or PLACEBO (TWILIGHT Research study)  . atorvastatin (LIPITOR) 80 MG tablet Take 1 tablet (80 mg total) by mouth daily at 6 PM.  . escitalopram (LEXAPRO) 20 MG tablet Take 1 tablet (20 mg total) by mouth daily.  . nitroGLYCERIN (NITROSTAT) 0.4 MG SL tablet Place 1 tablet (0.4 mg total) under the tongue every 5 (five) minutes as needed for chest pain.   No Known Allergies Past Medical History:  Diagnosis Date  . CAD in native artery    a. NSTEMI 04/2016 s/p DES to mLAD- Twilight study, no sig disease otherwise.  Marland Kitchen. Headache   . Hyperlipidemia   . Hypotension   . NSTEMI (non-ST elevated myocardial infarction) (HCC) 04/2016   PCI to the mid LAD; normal LV function  . Pneumothorax   . Sinus bradycardia   . Tobacco abuse    Family History  Problem Relation Age of Onset  . Healthy Mother   . Healthy Father   . Heart attack Other   . Stroke Maternal Aunt   . Heart attack Maternal Grandmother 34    deceased   Past Surgical History:  Procedure Laterality Date  . ABSCESS DRAINAGE    . CARDIAC CATHETERIZATION N/A 05/02/2016   Procedure: Left Heart Cath and Coronary Angiography;  Surgeon: Corky CraftsJayadeep S Varanasi, MD;  [facility];  Service: Cardiovascular;  Laterality: N/A;  . CARDIAC CATHETERIZATION N/A 05/02/2016   Procedure: Coronary Stent Intervention;  Surgeon: Corky CraftsJayadeep S Varanasi, MD;  [facility];  Service: Cardiovascular;  Laterality: N/A;   Social History   Social History  . Marital status: Divorced    Spouse name: N/A  . Number of children: N/A  . Years of education: N/A   Occupational History  . Not on file.   Social History Main Topics  . Smoking status: Former Smoker    Packs/day: 1.00    Years: 20.00    Types: Cigars, Cigarettes    Quit date: 07/08/2016  . Smokeless tobacco: Current User  . Alcohol use No  . Drug use:     Types: Marijuana  . Sexual activity: Yes    Birth control/ protection:  Condom   Other Topics Concern  . Not on file   Social History Narrative  . No narrative on file     Review of Systems: General: negative for chills, fever, night sweats or weight changes.  Cardiovascular: negative for chest pain, dyspnea on exertion, edema, orthopnea, palpitations, paroxysmal nocturnal dyspnea or shortness of breath Dermatological: negative for rash Respiratory: negative for cough or wheezing Urologic: negative for hematuria Abdominal: negative for nausea, vomiting, diarrhea, bright red blood per rectum, melena, or hematemesis Neurologic: negative for visual changes, syncope, or dizziness All other systems reviewed and are otherwise negative except as noted above.   Physical Exam:  Blood pressure 120/74, pulse 86, height 5\' 8"  (1.727 m), weight 176 lb 9.6 oz (80.1 kg), SpO2 90 %.  General appearance: alert, cooperative and no distress Neck: no carotid bruit and no JVD Lungs: clear to auscultation bilaterally Heart: regular rate and rhythm, S1, S2 normal, no murmur, click, rub or gallop Extremities: extremities normal, atraumatic, no cyanosis or edema Pulses: 2+ and symmetric Skin: Skin color, texture, turgor normal. No rashes or lesions Neurologic: Grossly normal  EKG not performed   ASSESSMENT AND PLAN:   1. Paresthesias - likely neurological based on cervical spine MRI suggesting pinched nerve. Low risk for cardiac etiology given normal ETT. He will f/u with his PCP for further management of this.   2. CAD - as above. No anginal symptoms. Continue TWILIGHT study meds. Not on BB due to prior issues with hypotension/bradycardia.  3. Tobacco abuse - he started back smoking gain, but is motivated to quit again  for his daughter.    PLAN  F/u with Dr. Clifton [name] in 3 months for CAD.   Ricky LisBrittainy Inga Noller PA-C 10/08/2016 10:39 AM

## 2016-10-23 ENCOUNTER — Ambulatory Visit: Payer: Self-pay | Attending: Internal Medicine

## 2016-11-05 MED FILL — ATORVASTATIN 80 MG TABLET: 80 | 30 days supply | Qty: 30 | Fill #2

## 2016-11-05 MED FILL — ?ESCITALOPRAM 20 MG TABLET: 20 | 30 days supply | Qty: 30 | Fill #2

## 2016-11-25 ENCOUNTER — Telehealth: Payer: Self-pay | Admitting: Internal Medicine

## 2016-11-25 ENCOUNTER — Encounter: Payer: Self-pay | Admitting: Licensed Clinical Social Worker

## 2016-11-25 NOTE — BH Specialist Note (Signed)
LCSWA received an incoming call from pt via transfer from front desk staff member.    Pt disclosed that he has been experiencing suicidal ideations and audio hallucinations for approximately two weeks. Pt is compliant with taking prescribed medications; although, reports substance use (marijuana and alcohol) on a daily basis noting that his symptoms are decreased when under the influence. He reported that he had a plan to overdose on his prescription medication; however, refrained from doing so after he began to think of his family (daughter and parents)   LCSWA provided pt with crisis intervention resources. Pt verbally agreed [facility] and/or to the nearest Emergency Department at the end of phone call. LCSWA inquired about transportation barriers. Pt reported that he is with his boss/family friend of over twenty years who has agreed to provide transportation.

## 2016-11-25 NOTE — Telephone Encounter (Signed)
LCSWA spoke with pt and provided crisis intervention resources. Pt consented to safety plan and agreed to [facility] today.

## 2016-11-25 NOTE — Telephone Encounter (Signed)
Thanks for the update

## 2016-11-25 NOTE — Telephone Encounter (Signed)
Patient called the office to speak with PCP regarding suicidal thoughts that he has been having since the weekend. Transferring call to [employer]aton CorporationJasmie (Child psychotherapistsocial worker)

## 2016-11-27 MED FILL — ?ESCITALOPRAM 20 MG TABLET: 20 | 30 days supply | Qty: 30 | Fill #0

## 2016-12-06 MED FILL — ATORVASTATIN 80 MG TABLET: 80 | 30 days supply | Qty: 30 | Fill #3

## 2016-12-13 MED FILL — busPIRone HCL 5 MG TABS: 5 | 30 days supply | Qty: 90 | Fill #0

## 2017-01-03 ENCOUNTER — Encounter: Payer: Self-pay | Admitting: Cardiovascular Disease

## 2017-01-08 MED FILL — ATORVASTATIN 80 MG TABLET: 80 | 30 days supply | Qty: 30 | Fill #4

## 2017-01-08 MED FILL — ESCITALOPRAM 20 MG TABLET: 20 | 30 days supply | Qty: 30 | Fill #0

## 2017-01-08 MED FILL — ?BUSPIRONE HCL 5 MG TABLET: 5 | 30 days supply | Qty: 90 | Fill #1

## 2017-01-10 ENCOUNTER — Encounter: Payer: Self-pay | Admitting: Cardiovascular Disease

## 2017-01-10 ENCOUNTER — Ambulatory Visit (INDEPENDENT_AMBULATORY_CARE_PROVIDER_SITE_OTHER): Payer: Self-pay | Admitting: Cardiovascular Disease

## 2017-01-10 VITALS — BP 130/62 | HR 83 | Ht 68.0 in | Wt 175.0 lb

## 2017-01-10 DIAGNOSIS — Z720 Tobacco use: Secondary | ICD-10-CM

## 2017-01-10 DIAGNOSIS — I2510 Atherosclerotic heart disease of native coronary artery without angina pectoris: Secondary | ICD-10-CM

## 2017-01-10 DIAGNOSIS — E7800 Pure hypercholesterolemia, unspecified: Secondary | ICD-10-CM

## 2017-01-10 NOTE — Progress Notes (Signed)
Chief Complaint  Patient presents with  . Follow-up    tired all the time. no other complaints    History of Present Illness: 41 yo male with history of CAD, HLD, depression and tobacco abuse who is here today for follow up. He was admitted to [facility] July 2017 with a NSTEMI and was found to have a 90% stenosis in the mid LAD which was treated with a drug eluting stent (3.5 x 20mm Synergy DES). Normal LV function. He was seen several times in our office post MI with tingling in his fingers. Exercise stress test October 2017 negative for ischemia. He has been seen in primary care and MRI has confirmed worsening of chronic neck/back issues.   He is here today for follow up. He is feeling well. No chest pain or dyspnea. He is still smoking. He is back at work full time.   Primary Care Physician: Pete Glatter, MD   Past Medical History:  Diagnosis Date  . CAD in native artery    a. NSTEMI 04/2016 s/p DES to mLAD- Twilight study, no sig disease otherwise.  Marland Kitchen Headache   . Hyperlipidemia   . Hypotension   . NSTEMI (non-ST elevated myocardial infarction) (HCC) 04/2016   PCI to the mid LAD; normal LV function  . Pneumothorax   . Sinus bradycardia   . Tobacco abuse     Past Surgical History:  Procedure Laterality Date  . ABSCESS DRAINAGE    . CARDIAC CATHETERIZATION N/A 05/02/2016   Procedure: Left Heart Cath and Coronary Angiography;  Surgeon: Corky Crafts, MD;  Location: [facility] INVASIVE CV LAB;  Service: Cardiovascular;  Laterality: N/A;  . CARDIAC CATHETERIZATION N/A 05/02/2016   Procedure: Coronary Stent Intervention;  Surgeon: Corky Crafts, MD;  Location: [facility] INVASIVE CV LAB;  Service: Cardiovascular;  Laterality: N/A;    Current Outpatient Prescriptions  Medication Sig Dispense Refill  . AMBULATORY NON FORMULARY MEDICATION Take 90 mg by mouth 2 (two) times daily. Medication Name: Brilinta 90 mg BID (TWILIGHT Research Study Provided Do not fill)    . AMBULATORY NON FORMULARY  MEDICATION Take 81 mg by mouth daily. Medication Name: Aspirin 81 mg daily or PLACEBO (TWILIGHT Research study)    . atorvastatin (LIPITOR) 80 MG tablet Take 1 tablet (80 mg total) by mouth daily at 6 PM. 90 tablet 3  . busPIRone (BUSPAR) 5 MG tablet Take 5 mg by mouth 3 (three) times daily.  2  . escitalopram (LEXAPRO) 20 MG tablet Take 1 tablet (20 mg total) by mouth daily. 90 tablet 2  . nitroGLYCERIN (NITROSTAT) 0.4 MG SL tablet Place 1 tablet (0.4 mg total) under the tongue every 5 (five) minutes as needed for chest pain. 60 tablet 0   No current facility-administered medications for this visit.     No Known Allergies  Social History   Social History  . Marital status: Divorced    Spouse name: N/A  . Number of children: N/A  . Years of education: N/A   Occupational History  . Not on file.   Social History Main Topics  . Smoking status: Former Smoker    Packs/day: 1.00    Years: 20.00    Types: Cigars, Cigarettes    Quit date: 07/08/2016  . Smokeless tobacco: Current User  . Alcohol use No  . Drug use: Yes    Types: Marijuana  . Sexual activity: Yes    Birth control/ protection: Condom   Other Topics Concern  . Not  on file   Social History Narrative  . No narrative on file    Family History  Problem Relation Age of Onset  . Healthy Mother   . Healthy Father   . Heart attack Other   . Stroke Maternal Aunt   . Heart attack Maternal Grandmother 34    deceased    Review of Systems:  As stated in the HPI and otherwise negative.   BP 130/62   Pulse 83   Ht 5\' 8"  (1.727 m)   Wt 175 lb (79.4 kg)   SpO2 97%   BMI 26.61 kg/m   Physical Examination: General: Well developed, well nourished, NAD  HEENT: OP clear, mucus membranes moist  SKIN: warm, dry. No rashes. Neuro: No focal deficits  Musculoskeletal: Muscle strength 5/5 all ext  Psychiatric: Mood and affect normal  Neck: No JVD, no carotid bruits, no thyromegaly, no lymphadenopathy.  Lungs:Clear  bilaterally, no wheezes, rhonci, crackles Cardiovascular: Regular rate and rhythm. No murmurs, gallops or rubs. Abdomen:Soft. Bowel sounds present. Non-tender.  Extremities: No lower extremity edema. Pulses are 2 + in the bilateral DP/PT.  Cardiac cath 7//6/17: Left Anterior Descending  Mid LAD lesion, 90% stenosed. Thrombotic.  PCI: The pre-interventional distal flow is normal (TIMI 3). Pre-stent angioplasty was performed. A drug-eluting stent was placed. The strut is apposed. Post-stent angioplasty was performed. The post-interventional distal flow is normal (TIMI 3). The intervention was successful. No complications occurred at this lesion.  Supplies used: BALLOON EMERGE MR 2.5X12; STENT SYNERGY DES 3.5X20; BALLOON [place] TREK RX [id number]  There is no residual stenosis post intervention.  Ramus Intermedius  . Vessel is small.   Echo 05/02/16: Left ventricle: The cavity size was normal. Systolic function was   normal. The estimated ejection fraction was in the range of 55%   to 60%. Wall motion was normal; there were no regional wall   motion abnormalities. Left ventricular diastolic function   parameters were normal. - Atrial septum: No defect or patent foramen ovale was identified.  EKG:  EKG is not ordered today. The ekg ordered today demonstrates   Recent Labs: 05/03/2016: Magnesium 2.0 05/13/2016: BUN 8; Creat 1.08; Hemoglobin 16.2; Platelets 393; Potassium 4.3; Sodium 141 07/23/2016: ALT 35   Lipid Panel    Component Value Date/Time   CHOL 168 07/23/2016 0817   TRIG 181 (H) 07/23/2016 0817   HDL 50 07/23/2016 0817   CHOLHDL 3.4 07/23/2016 0817   VLDL 36 (H) 07/23/2016 0817   LDLCALC 82 07/23/2016 0817     Wt Readings from Last 3 Encounters:  01/10/17 175 lb (79.4 kg)  10/08/16 176 lb 9.6 oz (80.1 kg)  08/28/16 177 lb (80.3 kg)     Other studies Reviewed: Additional studies/ records that were reviewed today include: . Review of the above records demonstrates:    Assessment and Plan:   1. CAD without angina: No chest pain suggestive of ischemia. He is s/p NSTEMI July 2017 with placement of a single drug eluting stent in the mid LAD. LV function is normal. Exercise stress test October 2017 with no ischemia. He is on ASA, Brilinta and statin. He is in the TWILIGHT study protocol (3 months post stent will either be on ASA/brilinta or Brilinta alone). No beta blocker with bradycardia and hypotension.   2. Hyperlipidemia: He is on a statin. LDL at goal.    3. Tobacco abuse: Smoking cessation advised.  Current medicines are reviewed at length with the patient today.  The patient does not  have concerns regarding medicines.  The following changes have been made:  no change  Labs/ tests ordered today include:   No orders of the defined types were placed in this encounter.    Disposition:   FU with me in 6 months   Signed, Verne Carrowhristopher McAlhany, MD 01/10/2017 9:23 AM    [facility] [address], [place], [place]  [address] Phone: [phone]; Fax: [phone]

## 2017-01-10 NOTE — Patient Instructions (Signed)
Medication Instructions:  Your physician recommends that you continue on your current medications as directed. Please refer to the Current Medication list given to you today.   Labwork: none  Testing/Procedures: none  Follow-Up: Your physician recommends that you schedule a follow-up appointment in: October.  Please call our office in about 3 months to schedule this appointment   Any Other Special Instructions Will Be Listed Below (If Applicable).     If you need a refill on your cardiac medications before your next appointment, please call your pharmacy.

## 2017-02-12 MED FILL — ATORVASTATIN 80 MG TABLET: 80 | 30 days supply | Qty: 30 | Fill #5

## 2017-02-12 MED FILL — ESCITALOPRAM 20 MG TABLET: 20 | 30 days supply | Qty: 30 | Fill #1

## 2017-02-12 MED FILL — ?BUSPIRONE HCL 5 MG TABLET: 5 | 30 days supply | Qty: 90 | Fill #2

## 2017-02-18 ENCOUNTER — Telehealth: Payer: Self-pay | Admitting: *Deleted

## 2017-02-18 NOTE — Telephone Encounter (Signed)
Received call and voicemail that patient was out of ASA/Placebo what time could he come by tomorrow. I tried to call patient @ [phone] and it would Not go through (went straight to a busy signal). Communicated the need to research team and we will try to contact patient again tomorrow. He does have a research appointment 02/20/17 0800.

## 2017-02-19 ENCOUNTER — Encounter: Payer: Self-pay | Admitting: *Deleted

## 2017-02-19 DIAGNOSIS — Z006 Encounter for examination for normal comparison and control in clinical research program: Secondary | ICD-10-CM

## 2017-02-19 NOTE — Progress Notes (Addendum)
Ricky [name] came to the Research clinic for his 9 month office visit in the TWILIGHT Research study.  Had no complaints and has been compliant with his medications (100% with Brilinta and 99.5% with ASA/Placebo).  Has had no cessation of study drug or ticagrelor.  I dispensed 2 bottles of ticagrelor and 1 bottle of study drug.  Patient will return to clinic for his 15 month office visit to be scheduled.  Will call office for any needs related to study.

## 2017-03-05 MED FILL — busPIRone HCL 7.5 MG TABS: 7.5 | 30 days supply | Qty: 90 | Fill #0

## 2017-03-11 ENCOUNTER — Telehealth: Payer: Self-pay | Admitting: Internal Medicine

## 2017-03-11 MED ORDER — MECLIZINE HCL 25 MG PO TABS: 25.0000 mg | ORAL_TABLET | Freq: Three times a day (TID) | ORAL | 0 refills | Status: DC | PRN

## 2017-03-11 MED FILL — TRAVEL SICKNESS 25 MG TAB C: 25 | 10 days supply | Qty: 30 | Fill #0

## 2017-03-11 NOTE — Telephone Encounter (Signed)
I was checking my emails and found this email from pt. Sent directly from gmail to my email address.    Press the Enter key to open the contact card."[email or web address]>  Reply all   Tue 03/11/2017 8:31 AM  To:  Dierdre SearlesLangeland, Deandrae Wajda  *Caution - External Email* Good morning doc. This is Delsa Bernimothy Dicke 02/06/1976. Its been a few months since i seen you. I was finally approved for [employer] and i have an appointment next monday(21st) ab the bone spur in my neck. The past month things have been getting worse with my neck. Needles and numbness in my hands, pain in my neck and both shoulders,etc. Yesterday and today i have been dizzy and called out of work. Is there anything i can do or take to help with the dizziness, pain, etc?? I really need to get back to working. Thanks for your time.  Belva Ageeim Chaires  [phone]  - called pt, confirmed dob.  - Has appt w/ [facility] Monday for cerv sryinx and djd. I have not seen him since 11/17.  He c/o of  1 month numbness and pins/needles on bilat. upper extrem. - From  elbows to fingers.  Has had Dizziness x 2days, sometimes when he gets up or moves his head. Mild head congestion. Denies syncope. Recd he comes in and sees us this week.   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 rx for meclizine trial for now until exam  - gave Amy, front desk clerk info, for fu visit appt. Appreciate help.

## 2017-03-12 ENCOUNTER — Encounter: Payer: Self-pay | Admitting: Internal Medicine

## 2017-03-12 ENCOUNTER — Ambulatory Visit: Payer: Self-pay | Attending: Internal Medicine | Admitting: Internal Medicine

## 2017-03-12 VITALS — BP 112/69 | HR 72 | Temp 98.1°F | Resp 16 | Wt 163.0 lb

## 2017-03-12 DIAGNOSIS — I2510 Atherosclerotic heart disease of native coronary artery without angina pectoris: Secondary | ICD-10-CM | POA: Insufficient documentation

## 2017-03-12 DIAGNOSIS — G729 Myopathy, unspecified: Secondary | ICD-10-CM

## 2017-03-12 DIAGNOSIS — R200 Anesthesia of skin: Secondary | ICD-10-CM | POA: Insufficient documentation

## 2017-03-12 DIAGNOSIS — H6503 Acute serous otitis media, bilateral: Secondary | ICD-10-CM

## 2017-03-12 DIAGNOSIS — R42 Dizziness and giddiness: Secondary | ICD-10-CM | POA: Insufficient documentation

## 2017-03-12 DIAGNOSIS — Z131 Encounter for screening for diabetes mellitus: Secondary | ICD-10-CM

## 2017-03-12 DIAGNOSIS — E785 Hyperlipidemia, unspecified: Secondary | ICD-10-CM | POA: Insufficient documentation

## 2017-03-12 LAB — POCT GLYCOSYLATED HEMOGLOBIN (HGB A1C): HEMOGLOBIN A1C: 5.5

## 2017-03-12 MED ORDER — AMOXICILLIN-POT CLAVULANATE 875-125 MG PO TABS: 1.0000 | ORAL_TABLET | Freq: Two times a day (BID) | ORAL | 0 refills | Status: DC

## 2017-03-12 MED ORDER — FLUTICASONE PROPIONATE 50 MCG/ACT NA SUSP: 2.0000 | Freq: Every day | NASAL | 6 refills | Status: DC

## 2017-03-12 MED ORDER — GABAPENTIN 300 MG PO CAPS: 300.0000 mg | ORAL_CAPSULE | Freq: Three times a day (TID) | ORAL | 1 refills | Status: DC | PRN

## 2017-03-12 MED ORDER — LORATADINE 10 MG PO TABS: 10.0000 mg | ORAL_TABLET | Freq: Every day | ORAL | 11 refills | Status: DC

## 2017-03-12 MED FILL — FLUTICASONE PROP 50 MCG SPR: 50 | 30 days supply | Qty: 16 | Fill #0

## 2017-03-12 MED FILL — ESCITALOPRAM 20 MG TABLET: 20 | 30 days supply | Qty: 30 | Fill #0

## 2017-03-12 MED FILL — ATORVASTATIN 80 MG TABLET: 80 | 30 days supply | Qty: 30 | Fill #6

## 2017-03-12 MED FILL — GABAPENTIN 300 MG CAPSULE: 300 | 30 days supply | Qty: 90 | Fill #0

## 2017-03-12 MED FILL — AMOX-CLAV 875-125 MG TABLET: 875-125 | 10 days supply | Qty: 20 | Fill #0

## 2017-03-12 NOTE — Progress Notes (Signed)
Ricky [name], is a 41 y.o. male  UJW:[id number]  [id number]  DOB - 12/19/1975  Chief Complaint  Patient presents with  . Dizziness        Subjective:   Ricky Bernimothy Ricky [name] is a 41 y.o. 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 He finally has financial assistance now and has appt w/ ortho this coming Monday. He notes constant numbness/tingling bilat hands up to elbows for the last couple months. Nothing makes it better or worse.   He notes since Monday though, he has had increasing vertigo sx. Room spinning, mild ha, dizziness, mainly when he bends his neck forward or backward. Denies any photophobia, n/v, or ataxia. Ne denies any urinary /stool incontinence. He denies any nasal congestion/runny nose.  No f/c.  So sick could not go to wk for last couple days.   Has not picked up rx meclizine yet prescribed yesterday.  Mild headache when he has these sx.  Patient has No chest pain, No abdominal pain - No Nausea, No new weakness tingling or numbness, No Cough - SOB.  No problems updated.  ALLERGIES: No Known Allergies  PAST MEDICAL HISTORY: Past Medical History:  Diagnosis Date  . CAD in native artery    a. NSTEMI 04/2016 s/p DES to mLAD- Twilight study, no sig disease otherwise.  Marland Kitchen. Headache   . Hyperlipidemia   . Hypotension   . NSTEMI (non-ST elevated myocardial infarction) (HCC) 04/2016   PCI to the mid LAD; normal LV function  . Pneumothorax   . Sinus bradycardia   . Tobacco abuse     MEDICATIONS AT HOME: Prior to Admission medications   Medication Sig Start Date End Date Taking? Authorizing Provider  AMBULATORY NON FORMULARY MEDICATION Take 90 mg by mouth 2 (two) times daily. Medication Name: Brilinta 90 mg BID (TWILIGHT Research Study Provided Do not fill) 05/03/16   Kathleene HazelMcAlhany, Christopher D, MD  AMBULATORY NON FORMULARY MEDICATION Take 81 mg by mouth daily. Medication Name: Aspirin 81 mg daily or PLACEBO (TWILIGHT Research study)  08/02/16   Kathleene HazelMcAlhany, Christopher D, MD  amoxicillin-clavulanate (AUGMENTIN) 875-125 MG tablet Take 1 tablet by mouth 2 (two) times daily. 03/12/17   Pete GlatterLangeland, Taelor Moncada T, MD  atorvastatin (LIPITOR) 80 MG tablet Take 1 tablet (80 mg total) by mouth daily at 6 PM. 08/28/16   Twanisha Foulk T, MD  busPIRone (BUSPAR) 5 MG tablet Take 5 mg by mouth 3 (three) times daily. 12/13/16   [provider]  escitalopram (LEXAPRO) 20 MG tablet Take 1 tablet (20 mg total) by mouth daily. 08/28/16   Simpson Paulos, Kathaleen Grinderawn T, MD  fluticasone (FLONASE) 50 MCG/ACT nasal spray Place 2 sprays into both nostrils daily. 03/12/17   Pete GlatterLangeland, Shahara Hartsfield T, MD  gabapentin (NEURONTIN) 300 MG capsule Take 1 capsule (300 mg total) by mouth 3 (three) times daily as needed. 03/12/17   Pete GlatterLangeland, Jeremie Abdelaziz T, MD  loratadine (CLARITIN) 10 MG tablet Take 1 tablet (10 mg total) by mouth daily. 03/12/17   Pete GlatterLangeland, Tanay Massiah T, MD  meclizine (ANTIVERT) 25 MG tablet Take 1 tablet (25 mg total) by mouth 3 (three) times daily as needed for dizziness. 03/11/17   Pete GlatterLangeland, Jese Comella T, MD  nitroGLYCERIN (NITROSTAT) 0.4 MG SL tablet Place 1 tablet (0.4 mg total) under the tongue every 5 (five) minutes as needed for chest pain. 05/03/16   Rolly SalterPatel, Pranav M, MD     Objective:   Vitals:   03/12/17 0944  BP: 112/69  Pulse:  72  Resp: 16  Temp: 98.1 F (36.7 C)  TempSrc: Oral  SpO2: 96%  Weight: 163 lb (73.9 kg)    Exam General appearance : Awake, alert, not in any distress. Speech Clear. Not toxic looking, pleasant.   His voice sounds nasally, but he denies nasal congestion HEENT: Atraumatic and Normocephalic, pupils equally reactive to light. No nystagus. +noted bilat effusions posterior eardrums w/ dull light reflex. No serous dranage, no otitis externa or truly media noted bilat, no bubbles noted in effusions either. Neck: supple, no JVD. No cervical lymphadenopathy.  Chest:Good air entry bilaterally, no added sounds. CVS: S1 S2 regular, no murmurs/gallups or  rubs.  NO carotid bruits bilat. Abdomen: Bowel sounds active, Non tender and not distended with no gaurding, rigidity or rebound. Extremities: ms 5/5 bilat ue and le. Of note, he c/o of persistent numbness bilat hands curently.  B/L Lower Ext shows no edema, both legs are warm to touch Neurology: Awake alert, and oriented X 3, CN II-XII grossly intact, Non focal Skin:No Rash  Data Review Lab Results  Component Value Date   HGBA1C 5.5 03/12/2017   HGBA1C 5.2 04/20/2013    Depression screen [facility] 2/9 03/12/2017 08/28/2016 06/12/2016 05/14/2016  Decreased Interest 0 1 2 1   Down, Depressed, Hopeless 0 1 2 2   PHQ - 2 Score 0 2 4 3   Altered sleeping - 1 2 1   Tired, decreased energy - 1 1 2   Change in appetite - 1 1 1   Feeling bad or failure about yourself  - 1 2 2   Trouble concentrating - 0 1 1  Moving slowly or fidgety/restless - 1 1 1   Suicidal thoughts - 0 0 0  PHQ-9 Score - 7 12 11   Difficult doing work/chores - - - Somewhat difficult   Mri cerv spine 09/03/17 IMPRESSION: 1. 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 2. Mild progression in cervical spondylosis from 2008 with multilevel disc bulges and endplate degenerative changes. Spondylosis is greatest at C5-6 with there is mild right and moderate left foraminal narrowing. No significant canal stenosis.   Electronically Signed   By: Mitzi Hansen M.D.   On: 09/03/2016 17:54    Assessment & Plan    1. Vertigo, suspect inner ear problems due to bilat effusions, possibl early Bilateral acute serous otitis media, recurrence not specified - vs allergy component - short course abx augmentin bid x 7days - meclizine prn - claritin and flonase nasal spray bilat. - Basic metabolic panel - CBC with Differential  2. Diabetes mellitus screening - HgB A1c 5.5  3. cerv myopathy, ue numbness - of note, mri 09/03/16 noted stable small syrinx, and mild progression cerv spondylosis from 2008 w/ multilevel disc  bulges and endplate djd. - per pt, worse sx last 2 months or so - now has financial aid - has appt w/ ortho on Monday, encouraged to keep appt.  4. tob abuse/vaping - encouraged to quit, cessation tips discussed - needs pneumovac, but will hold due to possible acute infections.   Patient have been counseled extensively about nutrition and exercise  Return in about 4 weeks (around 04/09/2017), or if symptoms worsen or fail to improve, for vertigo/ otitis media.  The patient was given clear instructions to go to ER or return to medical center if symptoms don't improve, worsen or new problems develop. The patient verbalized understanding. The patient was told to call to get lab results if they haven't heard anything in the next week.  This note has been created with Education officer, environmental. Any transcriptional errors are unintentional.   Pete Glatter, MD, MBA/MHA [facility] [place], [place] [phone]   03/12/2017, 2:40 PM

## 2017-03-12 NOTE — Patient Instructions (Signed)
Otitis Media, Adult Otitis media is redness, soreness, and puffiness (swelling) in the space just behind your eardrum (middle ear). It may be caused by allergies or infection. It often happens along with a cold. Follow these instructions at home:  Take your medicine as told. Finish it even if you start to feel better.  Only take over-the-counter or prescription medicines for pain, discomfort, or fever as told by your doctor.  Follow up with your doctor as told. Contact a doctor if:  You have otitis media only in one ear, or bleeding from your nose, or both.  You notice a lump on your neck.  You are not getting better in 3-5 days.  You feel worse instead of better. Get help right away if:  You have pain that is not helped with medicine.  You have puffiness, redness, or pain around your ear.  You get a stiff neck.  You cannot move part of your face (paralysis).  You notice that the bone behind your ear hurts when you touch it. This information is not intended to replace advice given to you by your health care provider. Make sure you discuss any questions you have with your health care provider. Document Released: 04/01/2008 Document Revised: 03/21/2016 Document Reviewed: 05/11/2013 Elsevier Interactive Patient Education  2017 Elsevier Inc.    --  Meniere Disease Meniere disease is an inner ear disorder. It causes attacks of a spinning sensation (vertigo), dizziness, and ringing in the ear (tinnitus). It also causes hearing loss and a feeling of fullness or pressure in the ear. This is a lifelong condition, and it may get worse over time. You may have drop attacks or severe dizziness that makes you fall. A drop attack is when you suddenly fall without losing consciousness and you quickly recover after a few seconds or minutes. What are the causes? This condition is caused by having too much of the fluid that is in your inner ear (endolymph). When fluid builds up in your inner  ear, it affects the nerves that control balance and hearing. The reason for the fluid buildup is not known. Possible causes include:  Allergies.  An abnormal reaction of the body's defense system (autoimmune disease).  Viral infection of the inner ear.  Head injury. What increases the risk? You are more likely to develop this condition if:  You are older than age 64.  You have a family history of Meniere disease.  You have a history of autoimmune disease.  You have a history of migraine headaches. What are the signs or symptoms? Symptoms of this condition can come and go and may last for up to 4 hours at a time. Symptoms usually start in one ear. They may become more frequent and eventually involve both ears. Symptoms can include:  Fullness and pressure in your ear.  Roaring or ringing in your ear.  Vertigo and loss of balance.  Dizziness.  Decreased hearing.  Nausea and vomiting. How is this diagnosed? This condition is diagnosed based on:  A physical exam.  Tests , such as:  A hearing test (audiogram).  An electronystagmogram. This tests your balance nerve (vestibular nerve).  Imaging studies of your inner ear, such as CT scan or MRI.  Other balance tests, such as rotational or balance platform tests. How is this treated? There is no cure for this condition, but treatment can help to manage your symptoms. Treatment may include:  A low-salt diet. Limiting salt may help to reduce fluid in the body  and relieve symptoms.  Oral or injected medicines to reduce or control:  Vertigo.  Nausea.  Fluid retention.  Dizziness.  Use of an air pressure pulse generator. This is a machine that sends small pressure pulses into your ear canal.  Hearing aids.  Inner ear surgery. This is rare. When you have symptoms, it can be helpful to lie down on a flat surface and focus your eyes on one object that does not move. Try to stay in that position until your symptoms go  away. Follow these instructions at home: Eating and drinking   Eat the same amount of food at the same time every day, including snacks.  Do not skip meals.  Avoid caffeine.  Drink enough fluids to keep your urine clear or pale yellow.  Limit alcoholic drinks to one drink a day for non-pregnant women and 2 drinks a day for men. One drink equals 12 oz of beer, 5 oz of wine, or 1 oz of hard liquor.  Limit the salt (sodium) in your diet as told by your health care provider. Check ingredients and nutrition facts on packaged foods and beverages.  Do not eat foods that contain monosodium glutamate (MSG). General instructions   Do not use any products that contain nicotine or tobacco, such as cigarettes and e-cigarettes. If you need help quitting, ask your health care provider.  Take over-the-counter and prescription medicines only as told by your health care provider.  Find ways to reduce or avoid stress. If you need help with this, ask your health care provider.  Do not drive if you have vertigo or dizziness. Contact a health care provider if:  You have symptoms that last longer than 4 hours.  You have new or worse symptoms. Get help right away if:  You have been vomiting for 24 hours.  You cannot keep fluids down.  You have chest pain or trouble breathing. Summary  Meniere disease is an inner ear disorder. It causes attacks of a spinning sensation (vertigo), dizziness, and ringing in the ear (tinnitus). It also causes hearing loss and a feeling of fullness or pressure in the ear.  Symptoms of this condition can come and go and may last for up to 4 hours at a time.  When you have symptoms, it can be helpful to lie down on a flat surface and focus your eyes on one object that does not move. Try to stay in that position until your symptoms go away. This information is not intended to replace advice given to you by your health care provider. Make sure you discuss any questions  you have with your health care provider. Document Released: 10/11/2000 Document Revised: 09/04/2016 Document Reviewed: 09/04/2016 Elsevier Interactive Patient Education  2017 [employer].   -  For your tobacco abuse: Strongly recommend that he stop smoking back and all other inhaled products right away Call 1-800-Quit-Now to get free nicotine replacement from the state of [place]   -   Steps to Quit Smoking Smoking tobacco can be bad for your health. It can also affect almost every organ in your body. Smoking puts you and people around you at risk for many serious long-lasting (chronic) diseases. Quitting smoking is hard, but it is one of the best things that you can do for your health. It is never too late to quit. What are the benefits of quitting smoking? When you quit smoking, you lower your risk for getting serious diseases and conditions. They can include:  Lung cancer  or lung disease.  Heart disease.  Stroke.  Heart attack.  Not being able to have children (infertility).  Weak bones (osteoporosis) and broken bones (fractures). If you have coughing, wheezing, and shortness of breath, those symptoms may get better when you quit. You may also get sick less often. If you are pregnant, quitting smoking can help to lower your chances of having a baby of low birth weight. What can I do to help me quit smoking? Talk with your doctor about what can help you quit smoking. Some things you can do (strategies) include:  Quitting smoking totally, instead of slowly cutting back how much you smoke over a period of time.  Going to in-person counseling. You are more likely to quit if you go to many counseling sessions.  Using resources and support systems, such as:  Online chats with a Veterinary surgeoncounselor.  Phone quitlines.  Printed Materials engineerself-help materials.  Support groups or group counseling.  Text messaging programs.  Mobile phone apps or applications.  Taking medicines. Some of these  medicines may have nicotine in them. If you are pregnant or breastfeeding, do not take any medicines to quit smoking unless your doctor says it is okay. Talk with your doctor about counseling or other things that can help you. Talk with your doctor about using more than one strategy at the same time, such as taking medicines while you are also going to in-person counseling. This can help make quitting easier. What things can I do to make it easier to quit? Quitting smoking might feel very hard at first, but there is a lot that you can do to make it easier. Take these steps:  Talk to your family and friends. Ask them to support and encourage you.  Call phone quitlines, reach out to support groups, or work with a Veterinary surgeoncounselor.  Ask people who smoke to not smoke around you.  Avoid places that make you want (trigger) to smoke, such as:  Bars.  Parties.  Smoke-break areas at work.  Spend time with people who do not smoke.  Lower the stress in your life. Stress can make you want to smoke. Try these things to help your stress:  Getting regular exercise.  Deep-breathing exercises.  Yoga.  Meditating.  Doing a body scan. To do this, close your eyes, focus on one area of your body at a time from head to toe, and notice which parts of your body are tense. Try to relax the muscles in those areas.  Download or buy apps on your mobile phone or tablet that can help you stick to your quit plan. There are many free apps, such as QuitGuide from the [employer] developer(Centers for Disease Control and Prevention). You can find more support from smokefree.gov and other websites. This information is not intended to replace advice given to you by your health care provider. Make sure you discuss any questions you have with your health care provider. Document Released: 08/10/2009 Document Revised: 06/11/2016 Document Reviewed: 02/28/2015 Elsevier Interactive Patient Education  2017 [employer]

## 2017-03-13 ENCOUNTER — Encounter: Payer: Self-pay | Admitting: Internal Medicine

## 2017-03-13 LAB — BASIC METABOLIC PANEL
BUN / CREAT RATIO: 10 (ref 9–20)
BUN: 8 mg/dL (ref 6–20)
CALCIUM: 9.6 mg/dL (ref 8.7–10.2)
CO2: 25 mmol/L (ref 18–29)
CREATININE: 0.77 mg/dL (ref 0.76–1.27)
Chloride: 102 mmol/L (ref 96–106)
GFR calc Af Amer: 133 mL/min/{1.73_m2} (ref >59)
GFR calc non Af Amer: 115 mL/min/{1.73_m2} (ref >59)
GLUCOSE: 84 mg/dL (ref 65–99)
Potassium: 4.5 mmol/L (ref 3.5–5.2)
Sodium: 142 mmol/L (ref 134–144)

## 2017-03-13 LAB — CBC WITH DIFFERENTIAL/PLATELET
Basophils Absolute: 0 10*3/uL (ref 0.0–0.2)
Basos: 1 %
EOS (ABSOLUTE): 0.2 10*3/uL (ref 0.0–0.4)
EOS: 3 %
HEMATOCRIT: 43.6 % (ref 37.5–51.0)
HEMOGLOBIN: 14.7 g/dL (ref 13.0–17.7)
IMMATURE GRANS (ABS): 0 10*3/uL (ref 0.0–0.1)
IMMATURE GRANULOCYTES: 0 %
LYMPHS: 24 %
Lymphocytes Absolute: 1.4 10*3/uL (ref 0.7–3.1)
MCH: 32.5 pg (ref 26.6–33.0)
MCHC: 33.7 g/dL (ref 31.5–35.7)
MCV: 97 fL (ref 79–97)
MONOCYTES: 9 %
Monocytes Absolute: 0.5 10*3/uL (ref 0.1–0.9)
NEUTROS PCT: 63 %
Neutrophils Absolute: 3.7 10*3/uL (ref 1.4–7.0)
Platelets: 340 10*3/uL (ref 150–379)
RBC: 4.52 x10E6/uL (ref 4.14–5.80)
RDW: 14.8 % (ref 12.3–15.4)
WBC: 5.9 10*3/uL (ref 3.4–10.8)

## 2017-03-17 ENCOUNTER — Ambulatory Visit (INDEPENDENT_AMBULATORY_CARE_PROVIDER_SITE_OTHER): Payer: Self-pay | Admitting: Orthopedic Surgery

## 2017-03-17 ENCOUNTER — Encounter (INDEPENDENT_AMBULATORY_CARE_PROVIDER_SITE_OTHER): Payer: Self-pay | Admitting: Orthopedic Surgery

## 2017-03-17 DIAGNOSIS — M542 Cervicalgia: Secondary | ICD-10-CM

## 2017-03-17 NOTE — Progress Notes (Signed)
Office Visit Note   Patient: Ricky [name]           Date of Birth: 04/23/1976           MRN: [id number] Visit Date: 03/17/2017 Requested by: Pete GlatterLangeland, Dawn T, MD [address] [place], [place] [address] PCP: Pete GlatterLangeland, Dawn T, MD  Subjective: Chief Complaint  Patient presents with  . Neck - Pain    HPI: Ricky [name] is a 41 year old patient with neck pain since October 2017.  He did have a heart attack earlier that year in July.  He thought he was potentially having recurrent issues with his heart.  MRI scan of the neck was obtained she is reviewed today.  His cardiac physician ruled out any cardiac cause for his neck and left arm pain.  He describes constant arm pain with numbness and tingling in the arm.  He works in [employer].  He does describe his decreased sensation on the left arm with numbness in the left hand but only on the palmar surface.  He has been working from October until now.  He describes constant tingling in both hands left worse than right.  Denies any dorsal numbness.  MRI scan shows bulging disc at C4-5 along with some foraminal stenosis on both sides.              ROS: All systems reviewed are negative as they relate to the chief complaint within the history of present illness.  Patient denies  fevers or chills.   Assessment & Plan: Visit Diagnoses:  1. Cervicalgia     Plan: Impression is cervical spondylosis with bulging disc and foraminal stenosis.  It is a little bit worse on the left side by MRI scan.  [facility] expedient cervical spine injection this week with referral to Dr. Ophelia CharterYates in 2 weeks for evaluation of the patient's suitability for cervical spine fusion.  He is young for that procedure but he does have moderate stenosis on the left from this disc pathology.  Follow-Up Instructions: No Follow-up on file.   Orders:  Orders Placed This Encounter  Procedures  . Epidural Steroid Injection - Cervical/Thoracic  (Ancillary Performed)   No orders of the defined types were placed in this encounter.     Procedures: No procedures performed   Clinical Data: No additional findings.  Objective: Vital Signs: There were no vitals taken for this visit.  Physical Exam:   Constitutional: Patient appears well-developed HEENT:  Head: Normocephalic Eyes:EOM are normal Neck: Normal range of motion Cardiovascular: Normal rate Pulmonary/chest: Effort normal Neurologic: Patient is alert Skin: Skin is warm Psychiatric: Patient has normal mood and affect    Ortho Exam: Orthopedic exam demonstrates 5 out of 5 grip EPL FPL interosseous wrist flexion-extension biceps triceps and deltoid strength.  Reflexes symmetric 1+ out of 4 bilateral biceps and triceps.  Radial pulses intact bilaterally.  Shoulder exam demonstrates pretty reasonable range of motion with no restriction of passive range of motion and no course grinding or crepitus with internal/external rotation of either arm.  Rotatory cuff strength is intact.  Does have some paresthesias in the C6-7 distribution left compared to right.  [name] got reasonable cervical spine range of motion but with some reproduction of symptoms with rotation of the head to the left.  No other masses lymph adenopathy or skin changes noted in the neck region  Specialty Comments:  No specialty comments available.  Imaging: No results found.   PMFS History:  Patient Active Problem List   Diagnosis Date Noted  . Depression (emotion) 05/14/2016  . Tobacco abuse   . Coronary artery disease involving native coronary artery of native heart with unstable angina pectoris (HCC)   . Chest pain 05/02/2016  . Elevated troponin 05/02/2016  . NSTEMI (non-ST elevated myocardial infarction) [facility])    Past Medical History:  Diagnosis Date  . CAD in native artery    a. NSTEMI 04/2016 s/p DES to mLAD- Twilight study, no sig disease otherwise.  Marland Kitchen Headache   . Hyperlipidemia   .  Hypotension   . NSTEMI (non-ST elevated myocardial infarction) (HCC) 04/2016   PCI to the mid LAD; normal LV function  . Pneumothorax   . Sinus bradycardia   . Tobacco abuse     Family History  Problem Relation Age of Onset  . Healthy Mother   . Healthy Father   . Heart attack Other   . Stroke Maternal Aunt   . Heart attack Maternal Grandmother 34       deceased    Past Surgical History:  Procedure Laterality Date  . ABSCESS DRAINAGE    . CARDIAC CATHETERIZATION N/A 05/02/2016   Procedure: Left Heart Cath and Coronary Angiography;  Surgeon: Corky Crafts, MD;  Location: [facility] INVASIVE CV LAB;  Service: Cardiovascular;  Laterality: N/A;  . CARDIAC CATHETERIZATION N/A 05/02/2016   Procedure: Coronary Stent Intervention;  Surgeon: Corky Crafts, MD;  Location: [facility] INVASIVE CV LAB;  Service: Cardiovascular;  Laterality: N/A;   Social History   Occupational History  . Not on file.   Social History Main Topics  . Smoking status: Former Smoker    Packs/day: 1.00    Years: 20.00    Types: Cigars, Cigarettes    Quit date: 07/08/2016  . Smokeless tobacco: Current User  . Alcohol use No  . Drug use: Yes    Types: Marijuana  . Sexual activity: Yes    Birth control/ protection: Condom

## 2017-03-18 ENCOUNTER — Telehealth: Payer: Self-pay

## 2017-03-18 NOTE — Telephone Encounter (Signed)
Contacted pt to go over lab results pt is aware and doesn't have any questions or concerns 

## 2017-03-20 ENCOUNTER — Telehealth: Payer: Self-pay | Admitting: Cardiovascular Disease

## 2017-03-20 NOTE — Telephone Encounter (Signed)
Completed form faxed and given to medical records to scan into chart

## 2017-03-20 NOTE — Telephone Encounter (Signed)
Follow Up:    Pt wants to know if you receive his clearance from [facility] to stop his blood thinner?[facility].

## 2017-03-20 NOTE — Telephone Encounter (Signed)
Clearance form completed by Dr. Clifton JamesMcAlhany indicating it was OK to hold Brilinta 5 days before procedure. I spoke with pt and gave him this information.  I told him I would [facility] this afternoon.

## 2017-03-26 ENCOUNTER — Ambulatory Visit
Admission: RE | Admit: 2017-03-26 | Discharge: 2017-03-26 | Disposition: A | Discharge status: Home / Self Care | Payer: No Typology Code available for payment source | Source: Ambulatory Visit | Attending: Orthopedic Surgery | Admitting: Orthopedic Surgery

## 2017-03-26 DIAGNOSIS — M542 Cervicalgia: Secondary | ICD-10-CM

## 2017-03-26 MED ORDER — TRIAMCINOLONE ACETONIDE 40 MG/ML IJ SUSP (RADIOLOGY)
60.0000 mg | Freq: Once | INTRAMUSCULAR | Status: AC
  Administered 2017-03-26: 60 mg via EPIDURAL

## 2017-03-26 MED ORDER — IOPAMIDOL (ISOVUE-M 300) INJECTION 61%
1.0000 mL | Freq: Once | INTRAMUSCULAR | Status: AC | PRN
Start: 2017-03-26 — End: 2017-03-26
  Administered 2017-03-26: 1 mL via EPIDURAL

## 2017-03-26 NOTE — Discharge Instructions (Signed)

## 2017-04-11 ENCOUNTER — Encounter: Payer: Self-pay | Admitting: Internal Medicine

## 2017-04-11 ENCOUNTER — Ambulatory Visit: Payer: Self-pay | Attending: Internal Medicine | Admitting: Internal Medicine

## 2017-04-11 ENCOUNTER — Encounter (INDEPENDENT_AMBULATORY_CARE_PROVIDER_SITE_OTHER): Payer: Self-pay

## 2017-04-11 VITALS — BP 130/80 | HR 72 | Temp 98.7°F | Resp 17 | Ht 69.1 in | Wt 161.0 lb

## 2017-04-11 DIAGNOSIS — R42 Dizziness and giddiness: Secondary | ICD-10-CM | POA: Insufficient documentation

## 2017-04-11 DIAGNOSIS — I214 Non-ST elevation (NSTEMI) myocardial infarction: Secondary | ICD-10-CM | POA: Insufficient documentation

## 2017-04-11 DIAGNOSIS — I25110 Atherosclerotic heart disease of native coronary artery with unstable angina pectoris: Secondary | ICD-10-CM | POA: Insufficient documentation

## 2017-04-11 DIAGNOSIS — I2510 Atherosclerotic heart disease of native coronary artery without angina pectoris: Secondary | ICD-10-CM

## 2017-04-11 DIAGNOSIS — M47812 Spondylosis without myelopathy or radiculopathy, cervical region: Secondary | ICD-10-CM | POA: Insufficient documentation

## 2017-04-11 DIAGNOSIS — Z720 Tobacco use: Secondary | ICD-10-CM

## 2017-04-11 DIAGNOSIS — Z7982 Long term (current) use of aspirin: Secondary | ICD-10-CM | POA: Insufficient documentation

## 2017-04-11 DIAGNOSIS — Z955 Presence of coronary angioplasty implant and graft: Secondary | ICD-10-CM | POA: Insufficient documentation

## 2017-04-11 DIAGNOSIS — Z87891 Personal history of nicotine dependence: Secondary | ICD-10-CM | POA: Insufficient documentation

## 2017-04-11 DIAGNOSIS — F419 Anxiety disorder, unspecified: Secondary | ICD-10-CM | POA: Insufficient documentation

## 2017-04-11 DIAGNOSIS — F329 Major depressive disorder, single episode, unspecified: Secondary | ICD-10-CM | POA: Insufficient documentation

## 2017-04-11 MED ORDER — BUPROPION HCL 100 MG PO TABS: 100.0000 mg | ORAL_TABLET | Freq: Two times a day (BID) | ORAL | 3 refills | Status: DC

## 2017-04-11 MED FILL — ESCITALOPRAM 20 MG TABLET: 20 | 30 days supply | Qty: 30 | Fill #1

## 2017-04-11 MED FILL — GABAPENTIN 300 MG CAPSULE: 300 | 30 days supply | Qty: 90 | Fill #1

## 2017-04-11 MED FILL — ATORVASTATIN 80 MG TABLET: 80 | 30 days supply | Qty: 30 | Fill #7

## 2017-04-11 MED FILL — busPIRone HCL 7.5 MG TABS: 7.5 | 30 days supply | Qty: 90 | Fill #1

## 2017-04-11 MED FILL — buPROPion HCL 100 MG TABS: 100 | 30 days supply | Qty: 60 | Fill #0

## 2017-04-11 NOTE — Progress Notes (Signed)
Patient ID: Ricky [name], male    DOB: 11-01-1975  MRN: [id number]  CC: Dizziness (Better); Depression; and Anxiety   Subjective: Ricky [name] is a 41 y.o. male who presents to become established with me as his PCP and for routine follow-up His concerns today include:  41 year old male with history of coronary artery disease, HL, depression, cervical spondylosis. Patient was last seen 1 month ago by Dr. Illene Labrador with symptoms of vertigo and found to have serous otitis media.  1.  Dizziness resolved with abx  2.  Depression and anxiety worsen over past wk and not sure why.  Denies any increase life stresses -feeling blahh.  Sleeping a lot, appetite down.  No SI -seen at [facility] in Jan 2018 and again a few mths ago.  Next appt is July 16th. Has a hard time opening up to people  -on Buspar 7.5 TID and Lexapro 20 mg. Reports compliance -smokes pot "every now and then.  CAD: in Twilight blinded study where he is on a placebo vs ASA.   -quit tobacco about 1.5 wks ago.  Using e-cig that has 6% nicotine.  Weaning himself off  -denies CP/SOB/LE edema  Patient Active Problem List   Diagnosis Date Noted  . Depression (emotion) 05/14/2016  . Tobacco abuse   . Coronary artery disease involving native coronary artery of native heart with unstable angina pectoris (HCC)   . Chest pain 05/02/2016  . Elevated troponin 05/02/2016  . NSTEMI (non-ST elevated myocardial infarction) [facility])      Current Outpatient Prescriptions on File Prior to Visit  Medication Sig Dispense Refill  . AMBULATORY NON FORMULARY MEDICATION Take 90 mg by mouth 2 (two) times daily. Medication Name: Brilinta 90 mg BID (TWILIGHT Research Study Provided Do not fill)    . AMBULATORY NON FORMULARY MEDICATION Take 81 mg by mouth daily. Medication Name: Aspirin 81 mg daily or PLACEBO (TWILIGHT Research study)    . atorvastatin (LIPITOR) 80 MG tablet Take 1 tablet (80 mg total) by mouth daily at 6 PM. 90 tablet 3  .  busPIRone (BUSPAR) 5 MG tablet Take 5 mg by mouth 3 (three) times daily.  2  . escitalopram (LEXAPRO) 20 MG tablet Take 1 tablet (20 mg total) by mouth daily. 90 tablet 2  . fluticasone (FLONASE) 50 MCG/ACT nasal spray Place 2 sprays into both nostrils daily. 16 g 6  . gabapentin (NEURONTIN) 300 MG capsule Take 1 capsule (300 mg total) by mouth 3 (three) times daily as needed. 90 capsule 1  . meclizine (ANTIVERT) 25 MG tablet Take 1 tablet (25 mg total) by mouth 3 (three) times daily as needed for dizziness. 30 tablet 0  . nitroGLYCERIN (NITROSTAT) 0.4 MG SL tablet Place 1 tablet (0.4 mg total) under the tongue every 5 (five) minutes as needed for chest pain. 60 tablet 0  . loratadine (CLARITIN) 10 MG tablet Take 1 tablet (10 mg total) by mouth daily. (Patient not taking: Reported on 04/11/2017) 30 tablet 11   No current facility-administered medications on file prior to visit.     No Known Allergies  Social History   Social History  . Marital status: Divorced    Spouse name: N/A  . Number of children: N/A  . Years of education: N/A   Occupational History  . Not on file.   Social History Main Topics  . Smoking status: Former Smoker    Packs/day: 1.00    Years: 20.00    Types: Cigars, Cigarettes  Quit date: 07/08/2016  . Smokeless tobacco: Current User  . Alcohol use No  . Drug use: Yes    Types: Marijuana  . Sexual activity: Yes    Birth control/ protection: Condom   Other Topics Concern  . Not on file   Social History Narrative  . No narrative on file    Family History  Problem Relation Age of Onset  . Healthy Mother   . Healthy Father   . Heart attack Other   . Stroke Maternal Aunt   . Heart attack Maternal Grandmother 34       deceased    Past Surgical History:  Procedure Laterality Date  . ABSCESS DRAINAGE    . CARDIAC CATHETERIZATION N/A 05/02/2016   Procedure: Left Heart Cath and Coronary Angiography;  Surgeon: Corky CraftsJayadeep S Varanasi, MD;  [facility] CV LAB;  Service: Cardiovascular;  Laterality: N/A;  . CARDIAC CATHETERIZATION N/A 05/02/2016   Procedure: Coronary Stent Intervention;  Surgeon: Corky CraftsJayadeep S Varanasi, MD;  [facility];  Service: Cardiovascular;  Laterality: N/A;    ROS: Review of Systems -as stated above  PHYSICAL EXAM: BP 130/80 (BP Location: Right Arm, Patient Position: Sitting, Cuff Size: Normal)   Pulse 72   Temp 98.7 F (37.1 C) (Oral)   Resp 17   Ht 5' 9.1" (1.755 m)   Wt 161 lb (73 kg)   SpO2 94%   BMI 23.71 kg/m   Physical Exam  General appearance - alert, well appearing, Middle age Caucasian male and in no distress Mental status - alert, oriented to person, place, and time, normal mood, behavior, speech, dress, motor activity, and thought processes Neck - supple, no significant adenopathy Chest - clear to auscultation, no wheezes, rales or rhonchi, symmetric air entry Heart - normal rate, regular rhythm, normal S1, S2, no murmurs, rubs, clicks or gallops Extremities - peripheral pulses normal, no pedal edema, no clubbing or cyanosis  Depression screen PHQ 2/9 04/11/2017  Decreased Interest 1  Down, Depressed, Hopeless 3  PHQ - 2 Score 4  Altered sleeping 3  Tired, decreased energy 2  Change in appetite 3  Feeling bad or failure about yourself  2  Trouble concentrating 2  Moving slowly or fidgety/restless 1  Suicidal thoughts 1  PHQ-9 Score 18  Difficult doing work/chores -   GAD 7 : Generalized Anxiety Score 04/11/2017 03/12/2017 08/28/2016 06/12/2016  Nervous, Anxious, on Edge 1 1 1 1   Control/stop worrying 2 1 0 1  Worry too much - different things 2 0 1 1  Trouble relaxing 0 1 0 0  Restless 1 1 0 0  Easily annoyed or irritable 2 0 1 1  Afraid - awful might happen 0 0 0 0  Total GAD 7 Score 8 4 3 4   Anxiety Difficulty - - - -   ASSESSMENT AND PLAN: 1. Anxiety and depression -Encourage patient to follow-up with [facility]. He intends to keep his appointment next  month. -We discussed adding another antidepressant since he is already on maximum dose of the Lexapro. Patient agreeable to this. Went over possible side effects and given printed information. -Patient encouraged to follow-up ASAP with myself, [facility] or ER if depression or  anxiety worsens or he becomes suicidal. - buPROPion (WELLBUTRIN) 100 MG tablet; Take 1 tablet (100 mg total) by mouth 2 (two) times daily.  Dispense: 60 tablet; Refill: 3  2. Tobacco abuse -pt will continue to wean himself off e-cig.  Wellbutrin should help in  decrease craving  3. Coronary artery disease involving native coronary artery of native heart without angina pectoris -stable. -continue Lipitor -in Twilight study   Patient was given the opportunity to ask questions.  Patient verbalized understanding of the plan and was able to repeat key elements of the plan.   No orders of the defined types were placed in this encounter.    Requested Prescriptions   Signed Prescriptions Disp Refills  . buPROPion (WELLBUTRIN) 100 MG tablet 60 tablet 3    Sig: Take 1 tablet (100 mg total) by mouth 2 (two) times daily.    Return in about 4 months (around 08/11/2017).  Jonah Blue, MD, FACP

## 2017-04-11 NOTE — Patient Instructions (Signed)
Start Wellbutrin as discussed to help get the depression under better control.  This medication has the added benefit of decreasing craving for cigarettes.  I have enclosed some information below about this medication. Please [facility]. Follow up with me, ER or [facility] immediately if symptoms worsen or you become suicidal.    Bupropion tablets (Depression/Mood Disorders) What is this medicine? BUPROPION (byoo PROE pee on) is used to treat depression. This medicine may be used for other purposes; ask your health care provider or pharmacist if you have questions. COMMON BRAND NAME(S): Wellbutrin What should I tell my health care provider before I take this medicine? They need to know if you have any of these conditions: -an eating disorder, such as anorexia or bulimia -bipolar disorder or psychosis -diabetes or high blood sugar, treated with medication -glaucoma -heart disease, previous heart attack, or irregular heart beat -head injury or brain tumor -high blood pressure -kidney or liver disease -seizures -suicidal thoughts or a previous suicide attempt -Tourette's syndrome -weight loss -an unusual or allergic reaction to bupropion, other medicines, foods, dyes, or preservatives -breast-feeding -pregnant or trying to become pregnant How should I use this medicine? Take this medicine by mouth with a glass of water. Follow the directions on the prescription label. You can take it with or without food. If it upsets your stomach, take it with food. Take your medicine at regular intervals. Do not take your medicine more often than directed. Do not stop taking this medicine suddenly except upon the advice of your doctor. Stopping this medicine too quickly may cause serious side effects or your condition may worsen. A special MedGuide will be given to you by the pharmacist with each prescription and refill. Be sure to read this information carefully each  time. Talk to your pediatrician regarding the use of this medicine in children. Special care may be needed. Overdosage: If you think you have taken too much of this medicine contact a poison control center or emergency room at once. NOTE: This medicine is only for you. Do not share this medicine with others. What if I miss a dose? If you miss a dose, take it as soon as you can. If it is less than four hours to your next dose, take only that dose and skip the missed dose. Do not take double or extra doses. What may interact with this medicine? Do not take this medicine with any of the following medications: -linezolid -MAOIs like Azilect, Carbex, Eldepryl, Marplan, Nardil, and Parnate -methylene blue (injected into a vein) -other medicines that contain bupropion like Zyban This medicine may also interact with the following medications: -alcohol -certain medicines for anxiety or sleep -certain medicines for blood pressure like metoprolol, propranolol -certain medicines for depression or psychotic disturbances -certain medicines for HIV or AIDS like efavirenz, lopinavir, nelfinavir, ritonavir -certain medicines for irregular heart beat like propafenone, flecainide -certain medicines for Parkinson's disease like amantadine, levodopa -certain medicines for seizures like carbamazepine, phenytoin, phenobarbital -cimetidine -clopidogrel -cyclophosphamide -digoxin -furazolidone -isoniazid -nicotine -orphenadrine -procarbazine -steroid medicines like prednisone or cortisone -stimulant medicines for attention disorders, weight loss, or to stay awake -tamoxifen -theophylline -thiotepa -ticlopidine -tramadol -warfarin This list may not describe all possible interactions. Give your health care provider a list of all the medicines, herbs, non-prescription drugs, or dietary supplements you use. Also tell them if you smoke, drink alcohol, or use illegal drugs. Some items may interact with your  medicine. What should I watch for while using  this medicine? Tell your doctor if your symptoms do not get better or if they get worse. Visit your doctor or health care professional for regular checks on your progress. Because it may take several weeks to see the full effects of this medicine, it is important to continue your treatment as prescribed by your doctor. Patients and their families should watch out for new or worsening thoughts of suicide or depression. Also watch out for sudden changes in feelings such as feeling anxious, agitated, panicky, irritable, hostile, aggressive, impulsive, severely restless, overly excited and hyperactive, or not being able to sleep. If this happens, especially at the beginning of treatment or after a change in dose, call your health care professional. Avoid alcoholic drinks while taking this medicine. Drinking excessive alcoholic beverages, using sleeping or anxiety medicines, or quickly stopping the use of these agents while taking this medicine may increase your risk for a seizure. Do not drive or use heavy machinery until you know how this medicine affects you. This medicine can impair your ability to perform these tasks. Do not take this medicine close to bedtime. It may prevent you from sleeping. Your mouth may get dry. Chewing sugarless gum or sucking hard candy, and drinking plenty of water may help. Contact your doctor if the problem does not go away or is severe. What side effects may I notice from receiving this medicine? Side effects that you should report to your doctor or health care professional as soon as possible: -allergic reactions like skin rash, itching or hives, swelling of the face, lips, or tongue -breathing problems -changes in vision -confusion -elevated mood, decreased need for sleep, racing thoughts, impulsive behavior -fast or irregular heartbeat -hallucinations, loss of contact with reality -increased blood pressure -redness,  blistering, peeling or loosening of the skin, including inside the mouth -seizures -suicidal thoughts or other mood changes -unusually weak or tired -vomiting Side effects that usually do not require medical attention (report to your doctor or health care professional if they continue or are bothersome): -constipation -headache -loss of appetite -nausea -tremors -weight loss This list may not describe all possible side effects. Call your doctor for medical advice about side effects. You may report side effects to FDA at 1-800-FDA-1088. Where should I keep my medicine? Keep out of the reach of children. Store at room temperature between 20 and 25 degrees C (68 and 77 degrees F), away from direct sunlight and moisture. Keep tightly closed. Throw away any unused medicine after the expiration date. NOTE: This sheet is a summary. It may not cover all possible information. If you have questions about this medicine, talk to your doctor, pharmacist, or health care provider.  2018 Elsevier/Gold Standard (2016-04-05 13:44:21)

## 2017-04-16 ENCOUNTER — Ambulatory Visit (INDEPENDENT_AMBULATORY_CARE_PROVIDER_SITE_OTHER): Payer: Self-pay | Admitting: Orthopaedic Surgery

## 2017-05-07 MED FILL — FLUTICASONE PROP 50 MCG SPR: 50 | 30 days supply | Qty: 16 | Fill #1

## 2017-05-07 MED FILL — buPROPion HCL 100 MG TABS: 100 | 30 days supply | Qty: 60 | Fill #1

## 2017-05-07 MED FILL — ESCITALOPRAM 20 MG TABLET: 20 | 30 days supply | Qty: 30 | Fill #2

## 2017-05-07 MED FILL — busPIRone HCL 7.5 MG TABS: 7.5 | 30 days supply | Qty: 90 | Fill #2

## 2017-05-07 MED FILL — ATORVASTATIN 80 MG TABLET: 80 | 30 days supply | Qty: 30 | Fill #8

## 2017-08-06 ENCOUNTER — Other Ambulatory Visit: Payer: Self-pay | Admitting: *Deleted

## 2017-08-06 ENCOUNTER — Encounter: Payer: Self-pay | Admitting: *Deleted

## 2017-08-06 DIAGNOSIS — Z006 Encounter for examination for normal comparison and control in clinical research program: Secondary | ICD-10-CM

## 2017-08-06 MED ORDER — ASPIRIN EC 81 MG PO TBEC: 81.0000 mg | DELAYED_RELEASE_TABLET | Freq: Every day | ORAL | 3 refills | Status: DC

## 2017-08-06 NOTE — Progress Notes (Signed)
TWILIGHT research study month 15 follow up visit completed. He denies any bleeding events or other adverse events. He has returned unused pills and he has been 95% compliant with ASA/Placebo and 88% complaint with Brilinta. He has been instructed to start Plavix 75 mg daily and ASA 81 mg Daily (per Dr. Clifton James). Dennie Bible will call his new script in to the [facility] and [employer]. He only wants a 30 day supply. I instructed him to start ASA 81 mg daily and gave him the remaining Brilinta to help him out. Mr. Difiore was wanting to "come off " medication. I explained in great detail the importance of this medication. In talking with him I discovered that he has not been compliant with his other medication. I again review the importance of those and explained to him that he is young and really needs to be compliant. Patient verbalized understanding. He has 1 remaining telephone follow up call before 11/Jan/2019. I thanked hm for his participation in the study.

## 2017-08-08 ENCOUNTER — Encounter: Payer: Self-pay | Admitting: *Deleted

## 2017-08-08 ENCOUNTER — Other Ambulatory Visit: Payer: Self-pay | Admitting: *Deleted

## 2017-08-08 MED ORDER — CLOPIDOGREL BISULFATE 75 MG PO TABS: 75.0000 mg | ORAL_TABLET | Freq: Every day | ORAL | 11 refills | Status: DC

## 2017-08-08 MED FILL — ?CLOPIDOGREL 75MG TAB: 75 | 30 days supply | Qty: 30 | Fill #0

## 2017-08-11 ENCOUNTER — Encounter: Payer: Self-pay | Admitting: Cardiovascular Disease

## 2017-08-11 ENCOUNTER — Ambulatory Visit (INDEPENDENT_AMBULATORY_CARE_PROVIDER_SITE_OTHER): Payer: No Typology Code available for payment source | Admitting: Cardiovascular Disease

## 2017-08-11 ENCOUNTER — Other Ambulatory Visit: Payer: Self-pay | Admitting: *Deleted

## 2017-08-11 VITALS — BP 124/72 | HR 64 | Ht 68.0 in | Wt 191.2 lb

## 2017-08-11 DIAGNOSIS — E785 Hyperlipidemia, unspecified: Secondary | ICD-10-CM

## 2017-08-11 DIAGNOSIS — I2510 Atherosclerotic heart disease of native coronary artery without angina pectoris: Secondary | ICD-10-CM

## 2017-08-11 DIAGNOSIS — Z720 Tobacco use: Secondary | ICD-10-CM

## 2017-08-11 DIAGNOSIS — E7800 Pure hypercholesterolemia, unspecified: Secondary | ICD-10-CM

## 2017-08-11 LAB — HEPATIC FUNCTION PANEL
ALK PHOS: 54 IU/L (ref 39–117)
ALT: 14 IU/L (ref 0–44)
AST: 16 IU/L (ref 0–40)
Albumin: 4.4 g/dL (ref 3.5–5.5)
Bilirubin Total: <0.2 mg/dL (ref 0.0–1.2)
Bilirubin, Direct: 0.06 mg/dL (ref 0.00–0.40)
Total Protein: 6.5 g/dL (ref 6.0–8.5)

## 2017-08-11 LAB — LIPID PANEL
CHOLESTEROL TOTAL: 215 mg/dL — AB (ref 100–199)
Chol/HDL Ratio: 5.8 ratio — ABNORMAL HIGH (ref 0.0–5.0)
HDL: 37 mg/dL — AB (ref >39)
LDL CALC: 143 mg/dL — AB (ref 0–99)
Triglycerides: 175 mg/dL — ABNORMAL HIGH (ref 0–149)
VLDL CHOLESTEROL CAL: 35 mg/dL (ref 5–40)

## 2017-08-11 MED ORDER — ATORVASTATIN CALCIUM 80 MG PO TABS: 80.0000 mg | ORAL_TABLET | Freq: Every day | ORAL | 11 refills | Status: DC

## 2017-08-11 MED FILL — ATORVASTATIN 80 MG TABLET: 80 | 30 days supply | Qty: 30 | Fill #0

## 2017-08-11 NOTE — Patient Instructions (Signed)
Medication Instructions:  Your physician recommends that you continue on your current medications as directed. Please refer to the Current Medication list given to you today.   Labwork: Lab work to be done today--Lipid and Liver profiles  Testing/Procedures: none  Follow-Up: Your physician recommends that you schedule a follow-up appointment in: 12 months. Please call our office in about 9 months to schedule this appointment.     Any Other Special Instructions Will Be Listed Below (If Applicable).     If you need a refill on your cardiac medications before your next appointment, please call your pharmacy.   

## 2017-08-11 NOTE — Progress Notes (Signed)
Chief Complaint  Patient presents with  . Follow-up    cad    History of Present Illness: 41 yo male with history of CAD, HLD, depression and tobacco abuse who is here today for follow up. He was admitted to [facility] July 2017 with a NSTEMI and was found to have a 90% stenosis in the mid LAD which was treated with a drug eluting stent (3.5 x 20mm Synergy DES). Normal LV function. Exercise stress test October 2017 negative for ischemia.   He is here today for follow up. The patient denies any chest pain, dyspnea, palpitations, lower extremity edema, orthopnea, PND, dizziness, near syncope or syncope. He has stopped smoking and is using a vape, now down to low nicotine content. Hoping to stop the vape soon.    Primary Care Physician: Marcine Matar, MD  Past Medical History:  Diagnosis Date  . CAD in native artery    a. NSTEMI 04/2016 s/p DES to mLAD- Twilight study, no sig disease otherwise.  Marland Kitchen Headache   . Hyperlipidemia   . Hypotension   . NSTEMI (non-ST elevated myocardial infarction) (HCC) 04/2016   PCI to the mid LAD; normal LV function  . Pneumothorax   . Sinus bradycardia   . Tobacco abuse     Past Surgical History:  Procedure Laterality Date  . ABSCESS DRAINAGE    . CARDIAC CATHETERIZATION N/A 05/02/2016   Procedure: Left Heart Cath and Coronary Angiography;  Surgeon: Corky Crafts, MD;  Location: [facility] INVASIVE CV LAB;  Service: Cardiovascular;  Laterality: N/A;  . CARDIAC CATHETERIZATION N/A 05/02/2016   Procedure: Coronary Stent Intervention;  Surgeon: Corky Crafts, MD;  Location: [facility] INVASIVE CV LAB;  Service: Cardiovascular;  Laterality: N/A;    Current Outpatient Prescriptions  Medication Sig Dispense Refill  . aspirin EC 81 MG tablet Take 1 tablet (81 mg total) by mouth daily. 90 tablet 3  . atorvastatin (LIPITOR) 80 MG tablet Take 1 tablet (80 mg total) by mouth daily at 6 PM. 30 tablet 11  . clopidogrel (PLAVIX) 75 MG tablet Take 1 tablet (75 mg total) by  mouth daily. 30 tablet 11  . nitroGLYCERIN (NITROSTAT) 0.4 MG SL tablet Place 1 tablet (0.4 mg total) under the tongue every 5 (five) minutes as needed for chest pain. 60 tablet 0   No current facility-administered medications for this visit.     No Known Allergies  Social History   Social History  . Marital status: Divorced    Spouse name: N/A  . Number of children: N/A  . Years of education: N/A   Occupational History  . Not on file.   Social History Main Topics  . Smoking status: Former Smoker    Packs/day: 1.00    Years: 20.00    Types: Cigars, Cigarettes    Quit date: 07/08/2016  . Smokeless tobacco: Current User  . Alcohol use No  . Drug use: Yes    Types: Marijuana  . Sexual activity: Yes    Birth control/ protection: Condom   Other Topics Concern  . Not on file   Social History Narrative  . No narrative on file    Family History  Problem Relation Age of Onset  . Healthy Mother   . Healthy Father   . Stroke Maternal Aunt   . Heart attack Maternal Grandmother 34       deceased  . Heart attack Paternal Grandfather     Review of Systems:  As stated in the  HPI and otherwise negative.   BP 124/72   Pulse 64   Ht  (1.727 m)   Wt 191 lb 3.2 oz (86.7 kg)   SpO2 98%   BMI 29.07 kg/m   Physical Examination:  General: Well developed, well nourished, NAD  HEENT: OP clear, mucus membranes moist  SKIN: warm, dry. No rashes. Neuro: No focal deficits  Musculoskeletal: Muscle strength 5/5 all ext  Psychiatric: Mood and affect normal  Neck: No JVD, no carotid bruits, no thyromegaly, no lymphadenopathy.  Lungs:Clear bilaterally, no wheezes, rhonci, crackles Cardiovascular: Regular rate and rhythm. No murmurs, gallops or rubs. Abdomen:Soft. Bowel sounds present. Non-tender.  Extremities: No lower extremity edema. Pulses are 2 + in the bilateral DP/PT.  Cardiac cath 7//6/17: Left Anterior Descending  Mid LAD lesion, 90% stenosed. Thrombotic.  PCI: The  pre-interventional distal flow is normal (TIMI 3). Pre-stent angioplasty was performed. A drug-eluting stent was placed. The strut is apposed. Post-stent angioplasty was performed. The post-interventional distal flow is normal (TIMI 3). The intervention was successful. No complications occurred at this lesion.  Supplies used: BALLOON EMERGE MR 2.5X12; STENT SYNERGY DES 3.5X20; BALLOON [place] TREK RX [id number]  There is no residual stenosis post intervention.  [employer] Intermedius  . Vessel is small.   Echo 05/02/16: Left ventricle: The cavity size was normal. Systolic function was   normal. The estimated ejection fraction was in the range of 55%   to 60%. Wall motion was normal; there were no regional wall   motion abnormalities. Left ventricular diastolic function   parameters were normal. - Atrial septum: No defect or patent foramen ovale was identified.  EKG:  EKG is ordered today. The ekg ordered today demonstrates NSR, rate 67 bpm.   Recent Labs: 03/12/2017: BUN 8; Creatinine, Ser 0.77; Hemoglobin 14.7; Platelets 340; Potassium 4.5; Sodium 142   Lipid Panel    Component Value Date/Time   CHOL 168 07/23/2016 0817   TRIG 181 (H) 07/23/2016 0817   HDL 50 07/23/2016 0817   CHOLHDL 3.4 07/23/2016 0817   VLDL 36 (H) 07/23/2016 0817   LDLCALC 82 07/23/2016 0817     Wt Readings from Last 3 Encounters:  08/11/17 191 lb 3.2 oz (86.7 kg)  04/11/17 161 lb (73 kg)  03/12/17 163 lb (73.9 kg)     Other studies Reviewed: Additional studies/ records that were reviewed today include: . Review of the above records demonstrates:   Assessment and Plan:   1. CAD without angina: He is not having chest pain suggestive of angina. He had a drug eluting stent placed in the mid LAD in July 2017. Exercise stress test October 2017 with no ischemia. Will continue ASA, Plavix, statin. He is not on a beta blocker due to bradycardia.    2. Hyperlipidemia: LDL at goal. Continue statin. Check lipids and LFTs  today.   3. Tobacco abuse: Smoking cessation encouraged.   Current medicines are reviewed at length with the patient today.  The patient does not have concerns regarding medicines.  The following changes have been made:  no change  Labs/ tests ordered today include:   Orders Placed This Encounter  Procedures  . Lipid Profile  . Hepatic function panel  . EKG 12-Lead     Disposition:   FU with me in 12   months   Signed, Verne Carrow, MD 08/11/2017 11:10 AM    [facility] [address] [place], [place], [place]  [address] Phone: [phone]; Fax: [phone]

## 2017-09-10 MED FILL — ATORVASTATIN 80 MG TABLET: 80 | 30 days supply | Qty: 30 | Fill #1

## 2017-09-10 MED FILL — ?CLOPIDOGREL 75MG TAB: 75 | 30 days supply | Qty: 30 | Fill #1

## 2017-11-03 ENCOUNTER — Encounter: Payer: Self-pay | Admitting: *Deleted

## 2017-11-03 ENCOUNTER — Other Ambulatory Visit: Payer: No Typology Code available for payment source

## 2017-11-03 DIAGNOSIS — Z006 Encounter for examination for normal comparison and control in clinical research program: Secondary | ICD-10-CM

## 2017-11-03 NOTE — Progress Notes (Signed)
TWILIGHT research study month 18 telephone follow up completed. Patient states he is still on Plavix 75 mg daily and ASA 81 mg daily. He states he though he was finished with the medication and missed a few doses back in October. He denies any bleeding or other adverse events. I thanked him for his participation.

## 2017-11-11 ENCOUNTER — Inpatient Hospital Stay (HOSPITAL_COMMUNITY)
Admission: EM | Admit: 2017-11-11 | Discharge: 2017-11-12 | DRG: 287 | Disposition: A | Discharge status: Home / Self Care | Payer: Self-pay | Attending: Cardiology | Admitting: Cardiology

## 2017-11-11 ENCOUNTER — Emergency Department (HOSPITAL_COMMUNITY): Payer: Self-pay

## 2017-11-11 ENCOUNTER — Encounter (HOSPITAL_COMMUNITY): Payer: Self-pay | Admitting: *Deleted

## 2017-11-11 ENCOUNTER — Other Ambulatory Visit: Payer: Self-pay

## 2017-11-11 ENCOUNTER — Telehealth: Payer: Self-pay | Admitting: Cardiovascular Disease

## 2017-11-11 DIAGNOSIS — I2510 Atherosclerotic heart disease of native coronary artery without angina pectoris: Secondary | ICD-10-CM

## 2017-11-11 DIAGNOSIS — Z7902 Long term (current) use of antithrombotics/antiplatelets: Secondary | ICD-10-CM

## 2017-11-11 DIAGNOSIS — I252 Old myocardial infarction: Secondary | ICD-10-CM

## 2017-11-11 DIAGNOSIS — I25110 Atherosclerotic heart disease of native coronary artery with unstable angina pectoris: Principal | ICD-10-CM | POA: Diagnosis present

## 2017-11-11 DIAGNOSIS — Z79899 Other long term (current) drug therapy: Secondary | ICD-10-CM

## 2017-11-11 DIAGNOSIS — Z955 Presence of coronary angioplasty implant and graft: Secondary | ICD-10-CM

## 2017-11-11 DIAGNOSIS — Z9861 Coronary angioplasty status: Secondary | ICD-10-CM

## 2017-11-11 DIAGNOSIS — I200 Unstable angina: Secondary | ICD-10-CM

## 2017-11-11 DIAGNOSIS — I2109 ST elevation (STEMI) myocardial infarction involving other coronary artery of anterior wall: Secondary | ICD-10-CM

## 2017-11-11 DIAGNOSIS — Z8249 Family history of ischemic heart disease and other diseases of the circulatory system: Secondary | ICD-10-CM

## 2017-11-11 DIAGNOSIS — E785 Hyperlipidemia, unspecified: Secondary | ICD-10-CM

## 2017-11-11 DIAGNOSIS — Z87891 Personal history of nicotine dependence: Secondary | ICD-10-CM

## 2017-11-11 DIAGNOSIS — R079 Chest pain, unspecified: Secondary | ICD-10-CM | POA: Diagnosis present

## 2017-11-11 DIAGNOSIS — Z720 Tobacco use: Secondary | ICD-10-CM | POA: Diagnosis present

## 2017-11-11 DIAGNOSIS — Z7982 Long term (current) use of aspirin: Secondary | ICD-10-CM

## 2017-11-11 HISTORY — DX: Anxiety disorder, unspecified: F41.9

## 2017-11-11 HISTORY — DX: Major depressive disorder, single episode, unspecified: F32.9

## 2017-11-11 HISTORY — DX: Depression, unspecified: F32.A

## 2017-11-11 HISTORY — DX: Unspecified osteoarthritis, unspecified site: M19.90

## 2017-11-11 LAB — BASIC METABOLIC PANEL
Anion gap: 12 (ref 5–15)
Anion gap: 12 (ref 5–15)
BUN: 8 mg/dL (ref 6–20)
BUN: 8 mg/dL (ref 6–20)
CHLORIDE: 102 mmol/L (ref 101–111)
CO2: 23 mmol/L (ref 22–32)
CO2: 22 mmol/L (ref 22–32)
Calcium: 9.7 mg/dL (ref 8.9–10.3)
Calcium: 9.6 mg/dL (ref 8.9–10.3)
Chloride: 101 mmol/L (ref 101–111)
Creatinine, Ser: 0.97 mg/dL (ref 0.61–1.24)
Creatinine, Ser: 0.86 mg/dL (ref 0.61–1.24)
GFR calc Af Amer: >60 mL/min (ref >60)
GLUCOSE: 100 mg/dL — AB (ref 65–99)
Glucose, Bld: 98 mg/dL (ref 65–99)
POTASSIUM: 3.8 mmol/L (ref 3.5–5.1)
POTASSIUM: 3.7 mmol/L (ref 3.5–5.1)
SODIUM: 136 mmol/L (ref 135–145)
Sodium: 136 mmol/L (ref 135–145)

## 2017-11-11 LAB — CBC WITH DIFFERENTIAL/PLATELET
BASOS ABS: 0 10*3/uL (ref 0.0–0.1)
BASOS PCT: 1 %
EOS ABS: 0.1 10*3/uL (ref 0.0–0.7)
Eosinophils Relative: 2 %
HEMATOCRIT: 44.8 % (ref 39.0–52.0)
HEMOGLOBIN: 15.7 g/dL (ref 13.0–17.0)
Lymphocytes Relative: 31 %
Lymphs Abs: 2.1 10*3/uL (ref 0.7–4.0)
MCH: 32.2 pg (ref 26.0–34.0)
MCHC: 35 g/dL (ref 30.0–36.0)
MCV: 91.8 fL (ref 78.0–100.0)
MONOS PCT: 8 %
Monocytes Absolute: 0.6 10*3/uL (ref 0.1–1.0)
NEUTROS ABS: 3.8 10*3/uL (ref 1.7–7.7)
Neutrophils Relative %: 58 %
Platelets: 328 10*3/uL (ref 150–400)
RBC: 4.88 MIL/uL (ref 4.22–5.81)
RDW: 13.4 % (ref 11.5–15.5)
WBC: 6.6 10*3/uL (ref 4.0–10.5)

## 2017-11-11 LAB — HEPARIN LEVEL (UNFRACTIONATED): HEPARIN UNFRACTIONATED: 0.16 [IU]/mL — AB (ref 0.30–0.70)

## 2017-11-11 LAB — CBC
HEMATOCRIT: 46.3 % (ref 39.0–52.0)
HEMOGLOBIN: 16 g/dL (ref 13.0–17.0)
MCH: 32 pg (ref 26.0–34.0)
MCHC: 34.6 g/dL (ref 30.0–36.0)
MCV: 92.6 fL (ref 78.0–100.0)
Platelets: 343 10*3/uL (ref 150–400)
RBC: 5 MIL/uL (ref 4.22–5.81)
RDW: 13.5 % (ref 11.5–15.5)
WBC: 6.4 10*3/uL (ref 4.0–10.5)

## 2017-11-11 LAB — I-STAT TROPONIN, ED
Troponin i, poc: 0.01 ng/mL (ref 0.00–0.08)
Troponin i, poc: 0.04 ng/mL (ref 0.00–0.08)

## 2017-11-11 LAB — TROPONIN I
TROPONIN I: 0.05 ng/mL — AB (ref <0.03)
TROPONIN I: 0.05 ng/mL — AB (ref <0.03)
Troponin I: 0.03 ng/mL (ref <0.03)

## 2017-11-11 MED ORDER — ASPIRIN 81 MG PO CHEW
324.0000 mg | CHEWABLE_TABLET | Freq: Once | ORAL | Status: AC
  Administered 2017-11-11: 324 mg via ORAL
  Filled 2017-11-11: qty 4

## 2017-11-11 MED ORDER — ASPIRIN EC 81 MG PO TBEC
81.0000 mg | DELAYED_RELEASE_TABLET | Freq: Every day | ORAL | Status: DC
  Administered 2017-11-11: 81 mg via ORAL
  Filled 2017-11-11 (×2): qty 1

## 2017-11-11 MED ORDER — ANGIOPLASTY BOOK
Freq: Once | Status: AC
  Administered 2017-11-11: 1
  Filled 2017-11-11: qty 1

## 2017-11-11 MED ORDER — THE SENSUOUS HEART BOOK
Freq: Once | Status: AC
  Administered 2017-11-11: 1
  Filled 2017-11-11: qty 1

## 2017-11-11 MED ORDER — HEPARIN BOLUS VIA INFUSION
3000.0000 [IU] | Freq: Once | INTRAVENOUS | Status: AC
  Administered 2017-11-11: 3000 [IU] via INTRAVENOUS
  Filled 2017-11-11: qty 3000

## 2017-11-11 MED ORDER — ACETAMINOPHEN 325 MG PO TABS
650.0000 mg | ORAL_TABLET | ORAL | Status: DC | PRN
  Administered 2017-11-12: 15:00:00 650 mg via ORAL
  Filled 2017-11-11: qty 2

## 2017-11-11 MED ORDER — ACTIVE PARTNERSHIP FOR HEALTH OF YOUR HEART BOOK
Freq: Once | Status: AC
  Administered 2017-11-12: 1
  Filled 2017-11-11: qty 1

## 2017-11-11 MED ORDER — CLOPIDOGREL BISULFATE 75 MG PO TABS
75.0000 mg | ORAL_TABLET | Freq: Every day | ORAL | Status: DC
  Administered 2017-11-11 – 2017-11-12 (×2): 75 mg via ORAL
  Filled 2017-11-11 (×2): qty 1

## 2017-11-11 MED ORDER — SODIUM CHLORIDE 0.9% FLUSH: 3.0000 mL | INTRAVENOUS | Status: DC | PRN

## 2017-11-11 MED ORDER — SODIUM CHLORIDE 0.9 % WEIGHT BASED INFUSION
3.0000 mL/kg/h | INTRAVENOUS | Status: DC
  Administered 2017-11-12: 3 mL/kg/h via INTRAVENOUS

## 2017-11-11 MED ORDER — SODIUM CHLORIDE 0.9 % WEIGHT BASED INFUSION: 1.0000 mL/kg/h | INTRAVENOUS | Status: DC

## 2017-11-11 MED ORDER — SODIUM CHLORIDE 0.9 % IV SOLN: 250.0000 mL | INTRAVENOUS | Status: DC | PRN

## 2017-11-11 MED ORDER — SODIUM CHLORIDE 0.9% FLUSH
3.0000 mL | Freq: Two times a day (BID) | INTRAVENOUS | Status: DC
  Administered 2017-11-12: 11:00:00 3 mL via INTRAVENOUS

## 2017-11-11 MED ORDER — ATORVASTATIN CALCIUM 80 MG PO TABS
80.0000 mg | ORAL_TABLET | Freq: Every day | ORAL | Status: DC
  Administered 2017-11-11 – 2017-11-12 (×2): 80 mg via ORAL
  Filled 2017-11-11 (×2): qty 1

## 2017-11-11 MED ORDER — HEPARIN (PORCINE) IN NACL 100-0.45 UNIT/ML-% IJ SOLN
1500.0000 [IU]/h | INTRAMUSCULAR | Status: DC
  Administered 2017-11-11: 1200 [IU]/h via INTRAVENOUS
  Administered 2017-11-12: 06:00:00 1500 [IU]/h via INTRAVENOUS
  Filled 2017-11-11 (×2): qty 250

## 2017-11-11 MED ORDER — NITROGLYCERIN 0.4 MG SL SUBL
0.4000 mg | SUBLINGUAL_TABLET | SUBLINGUAL | Status: DC | PRN
Start: 2017-11-11 — End: 2017-11-12

## 2017-11-11 MED ORDER — HEPARIN BOLUS VIA INFUSION
4000.0000 [IU] | Freq: Once | INTRAVENOUS | Status: AC
  Administered 2017-11-11: 4000 [IU] via INTRAVENOUS
  Filled 2017-11-11: qty 4000

## 2017-11-11 MED ORDER — HEART ATTACK BOUNCING BOOK
Freq: Once | Status: AC
  Administered 2017-11-11: 22:00:00 1
  Filled 2017-11-11: qty 1

## 2017-11-11 MED ORDER — NITROGLYCERIN 0.4 MG SL SUBL
0.4000 mg | SUBLINGUAL_TABLET | SUBLINGUAL | Status: DC | PRN
  Administered 2017-11-11: 0.4 mg via SUBLINGUAL
  Filled 2017-11-11: qty 1

## 2017-11-11 NOTE — Progress Notes (Signed)
   The risks and benefits of a cardiac catheterization including, but not limited to, death, stroke, MI, kidney damage and bleeding were discussed with the patient who indicates understanding and agrees to proceed.   Jill McDaniel NP-C [facility] Pager: [phone]  

## 2017-11-11 NOTE — Telephone Encounter (Signed)
Received call transferred directly from operator and spoke with pt,. He reports dull ache in chest which started about 20 minutes ago. It is off and on. Currently having this pain. He is having cold sweats also. This is a new onset today.  He has been out of his medications for a few weeks.  I advised him if he has NTG he should try this.  I advised him to call 911 for transport to hospital.

## 2017-11-11 NOTE — H&P (Signed)
History & Physical    Patient ID: Ricky [name] MRN: [id number], DOB/AGE: Dec 03, 1975   Admit date: 11/11/2017 Primary Physician: Marcine Matar, MD Primary Cardiologist: Clifton James   Patient Profile   42 yo M with a hx of CAD (NSTEMI-TWILIGHT study protocol 04/2016 s/p DES to mLAD with normal LV function and no other significant disease otherwise), tobacco abuse, depression, HLD, not on BB due to baseline bradycardia who presents to [facility] ED with chest pain, diaphoresis, and nausea.   Past Medical History    Past Medical History:  Diagnosis Date  . CAD in native artery    a. NSTEMI 04/2016 s/p DES to mLAD- Twilight study, no sig disease otherwise.  Marland Kitchen Headache   . Hyperlipidemia   . Hypotension   . NSTEMI (non-ST elevated myocardial infarction) (HCC) 04/2016   PCI to the mid LAD; normal LV function  . Pneumothorax   . Sinus bradycardia   . Tobacco abuse     Past Surgical History:  Procedure Laterality Date  . ABSCESS DRAINAGE    . CARDIAC CATHETERIZATION N/A 05/02/2016   Procedure: Left Heart Cath and Coronary Angiography;  Surgeon: Corky Crafts, MD;  Location: [facility] INVASIVE CV LAB;  Service: Cardiovascular;  Laterality: N/A;  . CARDIAC CATHETERIZATION N/A 05/02/2016   Procedure: Coronary Stent Intervention;  Surgeon: Corky Crafts, MD;  Location: [facility] INVASIVE CV LAB;  Service: Cardiovascular;  Laterality: N/A;     Allergies  No Known Allergies  History of Present Illness    Ricky [name] is a 42 year old male with a history of CAD s/p DES stent to the mid-LAD in July 2017 with recurrent chest pain in October 2017 and a subsequent exercise stress test which was negative for ischemia, HLD, depression and tobacco abuse who presented to the emergency department on 11/11/2017 after a 45-minute onset of chest pain that began this morning after waking and getting ready for work. He describes his chest pain is mid-sternal, non-radiating dull chest pain (4-5 out of 10 in  severity).  He also reports diaphoresis, shortness of breath, and mild nausea associated with his chest pain.  He made a phone call to the cardiologist office who instructed him to take 1 sublingual nitroglycerin and go to the emergency department. His pain was relieved temporarily with the sublingual nitroglycerin.     Upon arrival to the emergency department, his pain had mildly returned.  He was given an additional sublingual nitroglycerin which has relieved his pain to 0 out of 10.  He is currently pain-free and stable. A chest x-ray was completed which showed no pneumonia or pulmonary edema, and mild chronic bronchitic smoking changes, but no other acute cardiopulmonary disease. His EKG was non-acute, showing normal sinus rhythm with no ischemic changes. His initial troponin level was negative at 0.01.He denies palpitations, lower extremity edema, orthopnea, PND, dizziness or syncope.  No reports of any recent illnesses.   Home Medications    Prior to Admission medications   Medication Sig Start Date End Date Taking? Authorizing Provider  aspirin EC 81 MG tablet Take 1 tablet (81 mg total) by mouth daily. 08/06/17  Yes Kathleene Hazel, MD  atorvastatin (LIPITOR) 80 MG tablet Take 1 tablet (80 mg total) by mouth daily at 6 PM. 08/11/17  Yes Kathleene Hazel, MD  clopidogrel (PLAVIX) 75 MG tablet Take 1 tablet (75 mg total) by mouth daily. 08/08/17  Yes Kathleene Hazel, MD  nitroGLYCERIN (NITROSTAT) 0.4 MG SL tablet Place 1 tablet (0.4  mg total) under the tongue every 5 (five) minutes as needed for chest pain. 05/03/16  Yes Rolly SalterPatel, Pranav M, MD    Family History    Family History  Problem Relation Age of Onset  . Healthy Mother   . Healthy Father   . Stroke Maternal Aunt   . Heart attack Maternal Grandmother 34       deceased  . Heart attack Paternal Grandfather     Social History    Social History   Socioeconomic History  . Marital status: Divorced    Spouse  name: Not on file  . Number of children: Not on file  . Years of education: Not on file  . Highest education level: Not on file  Social Needs  . Financial resource strain: Not on file  . Food insecurity - worry: Not on file  . Food insecurity - inability: Not on file  . Transportation needs - medical: Not on file  . Transportation needs - non-medical: Not on file  Occupational History  . Not on file  Tobacco Use  . Smoking status: Former Smoker    Packs/day: 1.00    Years: 20.00    Pack years: 20.00    Types: Cigars, Cigarettes    Last attempt to quit: 07/08/2016    Years since quitting: 1.3  . Smokeless tobacco: Current User  Substance and Sexual Activity  . Alcohol use: No    Alcohol/week: 0.0 oz  . Drug use: Yes    Types: Marijuana  . Sexual activity: Yes    Birth control/protection: Condom  Other Topics Concern  . Not on file  Social History Narrative  . Not on file     Review of Systems   Please see HPI  All other systems reviewed and are otherwise negative except as noted above.  Physical Exam    Blood pressure 131/82, pulse 75, temperature (!) 97.3 F (36.3 C), temperature source Oral, resp. rate 12, height 5\' 10"  (1.778 m), weight 185 lb (83.9 kg), SpO2 96 %.   General: Well developed, well nourished, NAD Skin: Warm, dry, intact  Head: Normocephalic, atraumatic, clear, moist mucus membranes. Neck: Negative for carotid bruits. No JVD Lungs:Clear to ausculation bilaterally. No wheezes, rales, or rhonchi. Breathing is unlabored. Cardiovascular: RRR with S1 S2. No murmurs, rubs, or gallops Abdomen: Soft, non-tender, non-distended with normoactive bowel sounds.  No obvious abdominal masses. MSK: Strength and tone appear normal for age. 5/5 in all extremities Extremities: No edema. No clubbing or cyanosis. DP/PT pulses 2+ bilaterally Neuro: Alert and oriented. No focal deficits. No facial asymmetry. MAE spontaneously. Psych: Responds to questions appropriately  with normal affect.    Labs    Troponin [facility](Point of Care Test) Recent Labs    11/11/17 0851  TROPIPOC 0.01   No results for input(s): CKTOTAL, CKMB, TROPONINI in the last 72 hours. Lab Results  Component Value Date   WBC 6.4 11/11/2017   HGB 16.0 11/11/2017   HCT 46.3 11/11/2017   MCV 92.6 11/11/2017   PLT 343 11/11/2017    Recent Labs  Lab 11/11/17 0839  NA 136  K 3.8  CL 101  CO2 23  BUN 8  CREATININE 0.97  CALCIUM 9.7  GLUCOSE 100*   Lab Results  Component Value Date   CHOL 215 (H) 08/11/2017   HDL 37 (L) 08/11/2017   LDLCALC 143 (H) 08/11/2017   TRIG 175 (H) 08/11/2017   Lab Results  Component Value Date   DDIMER <0.27 12/06/2012  Radiology Studies    Dg Chest 2 View  Result Date: 11/11/2017 CLINICAL DATA:  Onset of chest pain 45 minutes prior to admission. Symptoms were relieved with nitroglycerin but have returned. Patient ports associated shortness of breath with nausea and vomiting. EXAM: CHEST  2 VIEW COMPARISON:  Chest x-ray of May 01, 2016 FINDINGS: The lungs are well-expanded and clear. The interstitial markings are mildly prominent though stable. There is no alveolar infiltrate or pleural effusion. The heart and pulmonary vascularity are normal. The trachea is midline. The bony thorax exhibits no acute abnormality. IMPRESSION: There is no pneumonia nor pulmonary edema. Mild chronic bronchitic-smoking related changes, stable. All at Electronically Signed   By: David  [place] M.D.   On: 11/11/2017 09:00    ECG & Cardiac Imaging    11/11/17 NSR with no acute changes  Cath 04/2016:   The left ventricular systolic function is normal.  Mid LAD lesion, 90% stenosed. Post intervention witha 3.5 x 20 mm Synergy DES, postdilated to 3.8 mm, there is a 0% residual stenosis.  Normal LVEDP 11 mm Hg.  Continue dual antiplatelet therapy for at least a year. Continue aggressive secondary prevention including smoking cessation.  Echo 05/02/2016: Study  Conclusions  - Left ventricle: The cavity size was normal. Systolic function was   normal. The estimated ejection fraction was in the range of 55%   to 60%. Wall motion was normal; there were no regional wall   motion abnormalities. Left ventricular diastolic function   parameters were normal. - Atrial septum: No defect or patent foramen ovale was identified.  Transthoracic echocardiography.  M-mode, complete 2D, spectral Doppler, and color Doppler.  Birthdate:  Patient birthdate: [birth year].  Age:  Patient is 42 yr old.  Sex:  Gender: male. BMI: 23.6 kg/m^2.  Blood pressure:     130/84  Patient status: Inpatient.  Study date:  Study date: 05/02/2016. Study time: 10:57 AM.  Location:  Bedside.  Assessment & Plan    1. Unstable Angina: -Initial POC troponin negative, 0.01 -Will continue to cycle enzymes -EKG unremarkable -Currently chest pain free -EF per echo in 2017 55-60% -Last seen in the office 08/11/2017 will instructions to continue ASA, Plavix, and statin therapy. Has not taken his medications since the end of December -Reiterated the importance of medication compliance given his cardiac history -Will restart ASA, Plavix -Due to baseline bradycardia, BB has not been used in the past  -Hep gtt per pharmacy -Will admit and plan for cath tomorrow  2. Hyperlipidemia: -LDL at goal -Continue statin   3. Tobacco abuse: -Encouraged smoking cessation   Signed, Georgie Chard, NP 11/11/2017, @NOW  Pager: [phone]  I have seen, examined and evaluated the patient this afternoon in the emergency room along with Georgie Chard, NP.  After reviewing all the available data and chart, we discussed the patients laboratory, study & physical findings as well as symptoms in detail. I agree with her findings, examination as well as impression recommendations as per our discussion.    Ricky [name] is a very pleasant young man with history of anterior STEMI about a year and a half ago who  presents now with an episode of about 15-20 minutes of substernal chest discomfort which was very similar to his infarct related anginal symptoms.  The only thing he did not notice was that radiation down the left arm and the profuse nausea and sweating that he had with the MI.  He did not get to that extent.  The intensity of pain was  not as bad but was similar.  He did have some dyspnea associated with it and did feel a little bit weak.  He then had another episode at rest while here in the emergency room that was resolved with these episodes are concerning for unstable angina and it feels best to proceed with cardiac catheterization. Unfortunately, he has been out of medications for the last week or 2, and that may have been an issue. I do want to restart his aspirin Plavix and will place him on IV heparin.  We will plan on cardiac catheterization for definitive evaluation tomorrow.  I have recommended that he contact [facility] online pharmacy in order to try to obtain a 1 year supply of Plavix if further PCI is required.  Beta-blocker and statin. Smoking cessation counseling provided.  The patient is well from a with a procedure of heart catheterization and the risks and benefits involved.  We discussed them in detail.  He agreed to proceed. --See separate note from Georgie Chard, NP   The risks and benefits of a cardiac catheterization including, but not limited to, death, stroke, MI, kidney damage and bleeding were discussed with the patient who indicates understanding and agrees to proceed.   Georgie Chard NP-C [facility] Pager: [phone]    Bryan Lemma, M.D., M.S. Interventional Cardiologist   Pager # [phone] Phone # [phone] [address] [place], [place] [address]

## 2017-11-11 NOTE — Progress Notes (Signed)
ANTICOAGULATION CONSULT NOTE   Pharmacy Consult for Heparin Indication: chest pain/ACS  No Known Allergies  Patient Measurements: Height: 5\' 10"  (177.8 cm) Weight: 185 lb (83.9 kg) IBW/kg (Calculated) : 73 Heparin Dosing Weight: 83.9kg  Vital Signs: Temp: 98 F (36.7 C) (01/15 2000) Temp Source: Oral (01/15 2000) BP: 144/88 (01/15 2000) Pulse Rate: 54 (01/15 2000)  Labs: Recent Labs    11/11/17 0839 11/11/17 1500 11/11/17 1530 11/11/17 2107  HGB 16.0  --  15.7  --   HCT 46.3  --  44.8  --   PLT 343  --  328  --   HEPARINUNFRC  --   --   --  0.16*  CREATININE 0.97  --  0.86  --   TROPONINI  --  0.03* 0.05*  --     Estimated Creatinine Clearance: 119.1 mL/min (by C-G formula based on SCr of 0.86 mg/dL).   Medical History: Past Medical History:  Diagnosis Date  . CAD in native artery    a. NSTEMI 04/2016 s/p DES to mLAD- Twilight study, no sig disease otherwise.  Marland Kitchen. Headache   . Hyperlipidemia   . Hypotension   . NSTEMI (non-ST elevated myocardial infarction) (HCC) 04/2016   PCI to the mid LAD; normal LV function  . Pneumothorax   . Sinus bradycardia   . Tobacco abuse     Medications:  Heparin  Assessment: 41yom presented to ED for chest pain. Pharmacy to dose heparin r/o ACS/STEMI. Minimal elevated Trop at 0.04. No anticoagulant PTA. Scr and CBC WNL. No bleeding noted.  Initial heparin level low at 0.16. No bleeding issues noted.   Goal of Therapy:  Heparin level 0.3-0.7 units/ml Monitor platelets by anticoagulation protocol: Yes   Plan:  Give 3000 units bolus x 1 Increase heparin infusion to 1500 units/hr Continue to monitor H&H and platelets  6 hour heparin level Daily heparin and CBC  Sheppard CoilFrank Wilson PharmD., BCPS Clinical Pharmacist 11/11/2017 9:58 PM

## 2017-11-11 NOTE — ED Notes (Signed)
Date and time results received: 11/11/17 5:15 PM   Test: Troponin Critical Value: 0.05  Name of Provider Notified: Ranae Palms. Harding MD

## 2017-11-11 NOTE — ED Triage Notes (Signed)
Pt reports chest pain that started approx 45 minutes ago. Pt reports taking a nitro with relief however now pain is returning. pt reports SOB, N/V

## 2017-11-11 NOTE — ED Provider Notes (Signed)
[facility] EMERGENCY DEPARTMENT Provider Note   CSN: [id number] Arrival date & time: 11/11/17  0830     History   Chief Complaint Chief Complaint  Patient presents with  . Chest Pain    HPI Ricky [name] is a 42 y.o. male.  HPI Patient with history of non-STEMI and stent placement in 2017 presents with central chest tightness starting at around 8:00 am today.  Noted no radiation.  Associated with shortness of breath and diaphoresis.  States he took nitroglycerin and 81 mg aspirin.  Pain has gone from a 3/10-to 1/10.  No recent fever or chills.  No cough.  No new lower extremity swelling or pain.  Patient states he has been out of his Plavix for the past 2 weeks. Past Medical History:  Diagnosis Date  . CAD in native artery    a. NSTEMI 04/2016 s/p DES to mLAD- Twilight study, no sig disease otherwise.  Marland Kitchen Headache   . Hyperlipidemia   . Hypotension   . NSTEMI (non-ST elevated myocardial infarction) (HCC) 04/2016   PCI to the mid LAD; normal LV function  . Pneumothorax   . Sinus bradycardia   . Tobacco abuse     Patient Active Problem List   Diagnosis Date Noted  . Dyslipidemia 11/11/2017  . Unstable angina (HCC) 11/11/2017  . Depression (emotion) 05/14/2016  . Tobacco abuse   . Coronary artery disease involving native coronary artery of native heart with unstable angina pectoris (HCC)   . Chest pain 05/02/2016  . Elevated troponin 05/02/2016  . NSTEMI (non-ST elevated myocardial infarction) [facility])     Past Surgical History:  Procedure Laterality Date  . ABSCESS DRAINAGE    . CARDIAC CATHETERIZATION N/A 05/02/2016   Procedure: Left Heart Cath and Coronary Angiography;  Surgeon: Corky Crafts, MD;  Location: [facility] INVASIVE CV LAB;  Service: Cardiovascular;  Laterality: N/A;  . CARDIAC CATHETERIZATION N/A 05/02/2016   Procedure: Coronary Stent Intervention;  Surgeon: Corky Crafts, MD;  Location: [facility] INVASIVE CV LAB;  Service: Cardiovascular;   Laterality: N/A;       Home Medications    Prior to Admission medications   Medication Sig Start Date End Date Taking? Authorizing Provider  aspirin EC 81 MG tablet Take 1 tablet (81 mg total) by mouth daily. 08/06/17  Yes Kathleene Hazel, MD  atorvastatin (LIPITOR) 80 MG tablet Take 1 tablet (80 mg total) by mouth daily at 6 PM. 08/11/17  Yes Kathleene Hazel, MD  clopidogrel (PLAVIX) 75 MG tablet Take 1 tablet (75 mg total) by mouth daily. 08/08/17  Yes Kathleene Hazel, MD  nitroGLYCERIN (NITROSTAT) 0.4 MG SL tablet Place 1 tablet (0.4 mg total) under the tongue every 5 (five) minutes as needed for chest pain. 05/03/16  Yes Rolly Salter, MD    Family History Family History  Problem Relation Age of Onset  . Healthy Mother   . Healthy Father   . Stroke Maternal Aunt   . Heart attack Maternal Grandmother 34       deceased  . Heart attack Paternal Grandfather     Social History Social History   Tobacco Use  . Smoking status: Former Smoker    Packs/day: 1.00    Years: 20.00    Pack years: 20.00    Types: Cigars, Cigarettes    Last attempt to quit: 07/08/2016    Years since quitting: 1.3  . Smokeless tobacco: Current User  Substance Use Topics  . Alcohol  use: No    Alcohol/week: 0.0 oz  . Drug use: Yes    Types: Marijuana     Allergies   Patient has no known allergies.   Review of Systems Review of Systems  Constitutional: Negative for chills and fever.  Respiratory: Positive for shortness of breath. Negative for cough.   Cardiovascular: Positive for chest pain. Negative for palpitations and leg swelling.  Gastrointestinal: Positive for nausea. Negative for abdominal pain, constipation, diarrhea and vomiting.  Musculoskeletal: Negative for back pain, myalgias and neck pain.  Skin: Negative for rash and wound.  Neurological: Negative for dizziness, weakness, light-headedness, numbness and headaches.  All other systems reviewed and are  negative.    Physical Exam Updated Vital Signs BP 131/82 (BP Location: Left Arm)   Pulse 75   Temp (!) 97.3 F (36.3 C) (Oral)   Resp 12   Ht 5\' 10"  (1.778 m)   Wt 83.9 kg (185 lb)   SpO2 96%   BMI 26.54 kg/m   Physical Exam  Constitutional: He is oriented to person, place, and time. He appears well-developed and well-nourished. No distress.  HENT:  Head: Normocephalic and atraumatic.  Mouth/Throat: Oropharynx is clear and moist.  Eyes: EOM are normal. Pupils are equal, round, and reactive to light.  Neck: Normal range of motion. Neck supple. No JVD present.  Cardiovascular: Normal rate and regular rhythm. Exam reveals no gallop and no friction rub.  No murmur heard. Pulmonary/Chest: Effort normal and breath sounds normal. No stridor. No respiratory distress. He has no wheezes. He has no rales. He exhibits no tenderness.  Abdominal: Soft. Bowel sounds are normal. There is no tenderness. There is no rebound and no guarding.  Musculoskeletal: Normal range of motion. He exhibits no edema or tenderness.  No lower extremity swelling, asymmetry or tenderness.  Distal pulses are 2+.  Neurological: He is alert and oriented to person, place, and time.  Moves all extremities without focal deficit.  Sensation fully intact.  Skin: Skin is warm and dry. Capillary refill takes less than 2 seconds. No rash noted. He is not diaphoretic. No erythema.  Psychiatric: He has a normal mood and affect. His behavior is normal.  Nursing note and vitals reviewed.    ED Treatments / Results  Labs (all labs ordered are listed, but only abnormal results are displayed) Labs Reviewed  BASIC METABOLIC PANEL - Abnormal; Notable for the following components:      Result Value   Glucose, Bld 100 (*)    All other components within normal limits  CBC  TROPONIN I  TROPONIN I  TROPONIN I  HEPARIN LEVEL (UNFRACTIONATED)  I-STAT TROPONIN, ED  I-STAT TROPONIN, ED    EKG  EKG  Interpretation  Date/Time:  Tuesday November 11 2017 08:38:55 EST Ventricular Rate:  87 PR Interval:  142 QRS Duration: 88 QT Interval:  356 QTC Calculation: 428 R Axis:   60 Text Interpretation:  Normal sinus rhythm Cannot rule out Anterior infarct , age undetermined Abnormal ECG Confirmed by Loren Racer ([id number]) on 11/11/2017 9:07:07 AM       Radiology Dg Chest 2 View  Result Date: 11/11/2017 CLINICAL DATA:  Onset of chest pain 45 minutes prior to admission. Symptoms were relieved with nitroglycerin but have returned. Patient ports associated shortness of breath with nausea and vomiting. EXAM: CHEST  2 VIEW COMPARISON:  Chest x-ray of May 01, 2016 FINDINGS: The lungs are well-expanded and clear. The interstitial markings are mildly prominent though stable. There is no  alveolar infiltrate or pleural effusion. The heart and pulmonary vascularity are normal. The trachea is midline. The bony thorax exhibits no acute abnormality. IMPRESSION: There is no pneumonia nor pulmonary edema. Mild chronic bronchitic-smoking related changes, stable. All at Electronically Signed   By: Ethylene Reznick  [place] M.D.   On: 11/11/2017 09:00    Procedures Procedures (including critical care time)  Medications Ordered in ED Medications  nitroGLYCERIN (NITROSTAT) SL tablet 0.4 mg (0.4 mg Sublingual Given 11/11/17 0949)  heparin bolus via infusion 4,000 Units (not administered)  heparin ADULT infusion 100 units/mL (25000 units/29050mL sodium chloride 0.45%) (not administered)  aspirin chewable tablet 324 mg (324 mg Oral Given 11/11/17 0948)     Initial Impression / Assessment and Plan / ED Course  I have reviewed the triage vital signs and the nursing notes.  Pertinent labs & imaging results that were available during my care of the patient were reviewed by me and considered in my medical decision making (see chart for details).     Patient is now pain-free after 1 nitroglycerin.  Initial troponin is normal.   Will discuss with cardiology. Dr. Herbie BaltimoreHarding will seen in the emergency department and admit. Final Clinical Impressions(s) / ED Diagnoses   Final diagnoses:  Unstable angina [facility](HCC)    ED Discharge Orders    None       Loren RacerYelverton, Oneil Behney, MD 11/11/17 1510

## 2017-11-11 NOTE — Telephone Encounter (Signed)
Agree. Thanks

## 2017-11-11 NOTE — Progress Notes (Signed)
ANTICOAGULATION CONSULT NOTE - Initial Consult  Pharmacy Consult for Heparin Indication: chest pain/ACS  No Known Allergies  Patient Measurements: Height: 5\' 10"  (177.8 cm) Weight: 185 lb (83.9 kg) IBW/kg (Calculated) : 73 Heparin Dosing Weight: 83.9kg  Vital Signs: Temp: 97.3 F (36.3 C) (01/15 0845) Temp Source: Oral (01/15 0845) BP: 131/82 (01/15 1413) Pulse Rate: 75 (01/15 1413)  Labs: Recent Labs    11/11/17 0839  HGB 16.0  HCT 46.3  PLT 343  CREATININE 0.97    Estimated Creatinine Clearance: 105.6 mL/min (by C-G formula based on SCr of 0.97 mg/dL).   Medical History: Past Medical History:  Diagnosis Date  . CAD in native artery    a. NSTEMI 04/2016 s/p DES to mLAD- Twilight study, no sig disease otherwise.  Marland Kitchen. Headache   . Hyperlipidemia   . Hypotension   . NSTEMI (non-ST elevated myocardial infarction) (HCC) 04/2016   PCI to the mid LAD; normal LV function  . Pneumothorax   . Sinus bradycardia   . Tobacco abuse     Medications:  Heparin  Assessment: 41yom presented to ED for chest pain. Pharmacy to dose heparin r/o ACS/STEMI. Minimal elevated Trop at 0.04. No anticoagulant PTA. Scr and CBC WNL. No bleeding noted.  Goal of Therapy:  Heparin level 0.3-0.7 units/ml Monitor platelets by anticoagulation protocol: Yes   Plan:  Give 4000 units bolus x 1 Start heparin infusion at 1200 units/hr Continue to monitor H&H and platelets  6 hour heparin level Daily heparin and CBC  Deborah Dondero 11/11/2017,2:49 PM

## 2017-11-11 NOTE — Telephone Encounter (Signed)
New message     Pt c/o of Chest Pain: STAT if CP now or developed within 24 hours  1. Are you having CP right now? Yes its a 2 its a dull ache   2. Are you experiencing any other symptoms (ex. SOB, nausea, vomiting, sweating)?  Sob , sweating , acid reflux feeling  3. How long have you been experiencing CP?  This morning about 20 minutes ago    4. Is your CP continuous or coming and going? Continuous pressure , it eases some then comes back   5. Have you taken Nitroglycerin?  Yes , but he hasnt taken any  ?

## 2017-11-11 NOTE — ED Notes (Signed)
Phlebotomy at the bedside  

## 2017-11-12 ENCOUNTER — Encounter (HOSPITAL_COMMUNITY)
Admission: EM | Disposition: A | Discharge status: Home / Self Care | Payer: Self-pay | Source: Home / Self Care | Attending: Cardiology

## 2017-11-12 DIAGNOSIS — I25110 Atherosclerotic heart disease of native coronary artery with unstable angina pectoris: Principal | ICD-10-CM

## 2017-11-12 HISTORY — PX: LEFT HEART CATH AND CORONARY ANGIOGRAPHY: CATH118249

## 2017-11-12 LAB — CBC
HCT: 46.5 % (ref 39.0–52.0)
Hemoglobin: 16.1 g/dL (ref 13.0–17.0)
MCH: 32.1 pg (ref 26.0–34.0)
MCHC: 34.6 g/dL (ref 30.0–36.0)
MCV: 92.6 fL (ref 78.0–100.0)
PLATELETS: 340 10*3/uL (ref 150–400)
RBC: 5.02 MIL/uL (ref 4.22–5.81)
RDW: 13.6 % (ref 11.5–15.5)
WBC: 6.9 10*3/uL (ref 4.0–10.5)

## 2017-11-12 LAB — BASIC METABOLIC PANEL
Anion gap: 12 (ref 5–15)
BUN: 9 mg/dL (ref 6–20)
CALCIUM: 9.9 mg/dL (ref 8.9–10.3)
CHLORIDE: 103 mmol/L (ref 101–111)
CO2: 23 mmol/L (ref 22–32)
CREATININE: 0.96 mg/dL (ref 0.61–1.24)
GFR calc non Af Amer: >60 mL/min (ref >60)
Glucose, Bld: 94 mg/dL (ref 65–99)
Potassium: 4.1 mmol/L (ref 3.5–5.1)
SODIUM: 138 mmol/L (ref 135–145)

## 2017-11-12 LAB — HEPARIN LEVEL (UNFRACTIONATED): HEPARIN UNFRACTIONATED: 0.64 [IU]/mL (ref 0.30–0.70)

## 2017-11-12 LAB — PROTIME-INR
INR: 1.03
Prothrombin Time: 13.4 seconds (ref 11.4–15.2)

## 2017-11-12 LAB — HIV ANTIBODY (ROUTINE TESTING W REFLEX): HIV Screen 4th Generation wRfx: NONREACTIVE

## 2017-11-12 LAB — TROPONIN I: Troponin I: 0.04 ng/mL (ref <0.03)

## 2017-11-12 SURGERY — LEFT HEART CATH AND CORONARY ANGIOGRAPHY
Anesthesia: LOCAL

## 2017-11-12 MED ORDER — ISOSORBIDE MONONITRATE ER 30 MG PO TB24: 30.0000 mg | ORAL_TABLET | Freq: Every day | ORAL | Status: DC

## 2017-11-12 MED ORDER — SODIUM CHLORIDE 0.9% FLUSH: 3.0000 mL | INTRAVENOUS | Status: DC | PRN

## 2017-11-12 MED ORDER — METOPROLOL TARTRATE 25 MG PO TABS: 25.0000 mg | ORAL_TABLET | Freq: Two times a day (BID) | ORAL | 0 refills | Status: DC

## 2017-11-12 MED ORDER — SODIUM CHLORIDE 0.9 % IV SOLN
INTRAVENOUS | Status: AC
  Administered 2017-11-12: 15:00:00 via INTRAVENOUS

## 2017-11-12 MED ORDER — CLOPIDOGREL BISULFATE 75 MG PO TABS: 75.0000 mg | ORAL_TABLET | Freq: Every day | ORAL | 0 refills | Status: DC

## 2017-11-12 MED ORDER — ATORVASTATIN CALCIUM 80 MG PO TABS: 80.0000 mg | ORAL_TABLET | Freq: Every day | ORAL | 0 refills | Status: DC

## 2017-11-12 MED ORDER — VERAPAMIL HCL 2.5 MG/ML IV SOLN
INTRAVENOUS | Status: AC
  Filled 2017-11-12: qty 2

## 2017-11-12 MED ORDER — ISOSORBIDE MONONITRATE ER 30 MG PO TB24: 30.0000 mg | ORAL_TABLET | Freq: Every day | ORAL | 0 refills | Status: DC

## 2017-11-12 MED ORDER — NITROGLYCERIN 1 MG/10 ML FOR IR/CATH LAB
INTRA_ARTERIAL | Status: DC | PRN
  Administered 2017-11-12: 200 ug via INTRACORONARY

## 2017-11-12 MED ORDER — LIDOCAINE HCL (PF) 1 % IJ SOLN
INTRAMUSCULAR | Status: DC | PRN
  Administered 2017-11-12: 2 mL

## 2017-11-12 MED ORDER — METOPROLOL TARTRATE 25 MG PO TABS
25.0000 mg | ORAL_TABLET | Freq: Two times a day (BID) | ORAL | Status: DC
  Administered 2017-11-12: 15:00:00 25 mg via ORAL
  Filled 2017-11-12: qty 1

## 2017-11-12 MED ORDER — HEPARIN (PORCINE) IN NACL 2-0.9 UNIT/ML-% IJ SOLN
INTRAMUSCULAR | Status: DC | PRN
  Administered 2017-11-12: 10 mL via INTRA_ARTERIAL

## 2017-11-12 MED ORDER — SODIUM CHLORIDE 0.9% FLUSH: 3.0000 mL | Freq: Two times a day (BID) | INTRAVENOUS | Status: DC

## 2017-11-12 MED ORDER — IOPAMIDOL (ISOVUE-370) INJECTION 76%
INTRAVENOUS | Status: AC
  Filled 2017-11-12: qty 100

## 2017-11-12 MED ORDER — HEPARIN SODIUM (PORCINE) 1000 UNIT/ML IJ SOLN
INTRAMUSCULAR | Status: DC | PRN
  Administered 2017-11-12: 4500 [IU] via INTRAVENOUS

## 2017-11-12 MED ORDER — MIDAZOLAM HCL 2 MG/2ML IJ SOLN
INTRAMUSCULAR | Status: DC | PRN
  Administered 2017-11-12: 1 mg via INTRAVENOUS

## 2017-11-12 MED ORDER — IOPAMIDOL (ISOVUE-370) INJECTION 76%
INTRAVENOUS | Status: DC | PRN
Start: 2017-11-12 — End: 2017-11-12
  Administered 2017-11-12: 90 mL

## 2017-11-12 MED ORDER — LIDOCAINE HCL (PF) 1 % IJ SOLN
INTRAMUSCULAR | Status: AC
  Filled 2017-11-12: qty 30

## 2017-11-12 MED ORDER — FENTANYL CITRATE (PF) 100 MCG/2ML IJ SOLN
INTRAMUSCULAR | Status: AC
  Filled 2017-11-12: qty 2

## 2017-11-12 MED ORDER — NITROGLYCERIN 0.4 MG SL SUBL: 0.4000 mg | SUBLINGUAL_TABLET | SUBLINGUAL | 0 refills | Status: DC | PRN

## 2017-11-12 MED ORDER — HEPARIN (PORCINE) IN NACL 2-0.9 UNIT/ML-% IJ SOLN
INTRAMUSCULAR | Status: AC
  Filled 2017-11-12: qty 1000

## 2017-11-12 MED ORDER — HEPARIN SODIUM (PORCINE) 1000 UNIT/ML IJ SOLN
INTRAMUSCULAR | Status: AC
  Filled 2017-11-12: qty 1

## 2017-11-12 MED ORDER — FENTANYL CITRATE (PF) 100 MCG/2ML IJ SOLN
INTRAMUSCULAR | Status: DC | PRN
  Administered 2017-11-12: 50 ug via INTRAVENOUS

## 2017-11-12 MED ORDER — ASPIRIN 81 MG PO CHEW
81.0000 mg | CHEWABLE_TABLET | Freq: Once | ORAL | Status: AC
  Administered 2017-11-12: 11:00:00 81 mg via ORAL

## 2017-11-12 MED ORDER — ASPIRIN 81 MG PO CHEW
CHEWABLE_TABLET | ORAL | Status: AC
  Filled 2017-11-12: qty 1

## 2017-11-12 MED ORDER — NITROGLYCERIN 1 MG/10 ML FOR IR/CATH LAB
INTRA_ARTERIAL | Status: AC
  Filled 2017-11-12: qty 10

## 2017-11-12 MED ORDER — HEPARIN (PORCINE) IN NACL 2-0.9 UNIT/ML-% IJ SOLN
INTRAMUSCULAR | Status: DC | PRN
  Administered 2017-11-12: 13:00:00

## 2017-11-12 MED ORDER — SODIUM CHLORIDE 0.9 % IV SOLN
250.0000 mL | INTRAVENOUS | Status: DC | PRN
Start: 2017-11-12 — End: 2017-11-12

## 2017-11-12 MED ORDER — MIDAZOLAM HCL 2 MG/2ML IJ SOLN
INTRAMUSCULAR | Status: AC
  Filled 2017-11-12: qty 2

## 2017-11-12 MED FILL — ?ISOSORBIDE MN 30 MG TAB SA: 30 | 30 days supply | Qty: 30 | Fill #0

## 2017-11-12 MED FILL — ATORVASTATIN 80 MG TABLET: 80 | 30 days supply | Qty: 30 | Fill #0

## 2017-11-12 MED FILL — NITROSTAT 0.4 MG TABLET SL: 0.4 | 20 days supply | Qty: 25 | Fill #0

## 2017-11-12 MED FILL — ?CLOPIDOGREL 75MG TABLET: 75 | 30 days supply | Qty: 30 | Fill #0

## 2017-11-12 MED FILL — METOPROLOL TARTRATE 25 MG T: 25 | 30 days supply | Qty: 60 | Fill #0

## 2017-11-12 SURGICAL SUPPLY — 11 items
CATH INFINITI 5FR ANG PIGTAIL (CATHETERS) ×1 IMPLANT
CATH OPTITORQUE JACKY 4.0 5F (CATHETERS) ×1 IMPLANT
DEVICE RAD COMP TR BAND LRG (VASCULAR PRODUCTS) ×1 IMPLANT
GLIDESHEATH SLEND SS 6F .021 (SHEATH) ×2 IMPLANT
GUIDEWIRE INQWIRE 1.5J.035X260 (WIRE) IMPLANT
INQWIRE 1.5J .035X260CM (WIRE) ×2
KIT HEART LEFT (KITS) ×2 IMPLANT
PACK CARDIAC CATHETERIZATION (CUSTOM PROCEDURE TRAY) ×2 IMPLANT
SYR MEDRAD MARK V 150ML (SYRINGE) ×2 IMPLANT
TRANSDUCER W/STOPCOCK (MISCELLANEOUS) ×2 IMPLANT
TUBING CIL FLEX 10 FLL-RA (TUBING) ×2 IMPLANT

## 2017-11-12 NOTE — Progress Notes (Signed)
[id number] Called by Nursing to see pt as he will probably be discharged later today. Gave fake cigarette and discussed smoking cessation. Pt encouraged to stop with vaping as he stated he is at very low nicotine. Encouraged to use fake cigarette and quit now. Gave heart healthy diet and ex ed. Reviewed plavix and NTG use. Pt did not have PTCA so I cannot write order for CRP 2. Cardiologist can write order for stable angina if pt qualifies. Pt was referred in 2017 but could not do due to no insurance coverage. Luetta Nuttingharlene Ubah Radke RN BSN 11/12/2017 4:20 PM

## 2017-11-12 NOTE — Discharge Summary (Signed)
Discharge Summary    Patient ID: Ricky [name],  MRN: [id number], DOB/AGE: 12-08-1975 42 y.o.  Admit date: 11/11/2017 Discharge date: 11/13/2017  Primary Care Provider: Marcine Matar Primary Cardiologist: Clifton James   Discharge Diagnoses    Principal Problem:   Chest pain Active Problems:   Tobacco abuse   Dyslipidemia   Unstable angina (HCC)   Allergies No Known Allergies  Diagnostic Studies/Procedures    Cath: 11/12/17  Conclusion     Previously placed Mid LAD stent is widely patent.  Balloon angioplasty was performed.  Previously placed Prox LAD stent (unknown type) is widely patent.  Ost 1st Diag lesion is 90% stenosed.  The left ventricular systolic function is normal.  LV end diastolic pressure is normal.  The left ventricular ejection fraction is 55-65% by visual estimate.   1.  Significant one-vessel coronary artery disease with widely patent proximal LAD stent.  Jailed first diagonal has 90% hazy ostial stenosis which is likely the culprit for unstable angina.  This vessel is medium in size and about 2 mm in diameter. 2.  Normal LV systolic function and normal left ventricular end-diastolic pressure.  Recommendations: The first diagonal had significant residual ostial stenosis post previous LAD PCI after reviewing the previous catheterization.  However, it does look hazy now and likely the culprit for  unstable angina.  However, given the location and relatively small size, I recommend medical therapy.  I agree with resuming dual antiplatelet therapy.  Angioplasty to the diagonal can be considered in the future if the patient has refractory angina.  _____________   History of Present Illness     42 year old male with a history of CAD s/p DES stent to the mid-LAD in July 2017 with recurrent chest pain in October 2017 and a subsequent exercise stress test which was negative for ischemia, HLD, depression and tobacco abuse who presented to the  emergency department on 11/11/2017 after a 45-minute onset of chest pain that began this morning after waking and getting ready for work. He described his chest pain as mid-sternal, non-radiating dull chest pain (4-5 out of 10 in severity).  He also reported diaphoresis, shortness of breath, and mild nausea associated with his chest pain.  He made a phone call to the cardiologist office who instructed him to take 1 sublingual nitroglycerin and go to the emergency department. His pain was relieved temporarily with the sublingual nitroglycerin.     Upon arrival to the emergency department, his pain had mildly returned.  He was given an additional sublingual nitroglycerin which has relieved his pain to 0 out of 10. A chest x-ray was completed which showed no pneumonia or pulmonary edema, and mild chronic bronchitic smoking changes, but no other acute cardiopulmonary disease. His EKG was non-acute, showing normal sinus rhythm with no ischemic changes. His initial troponin level was negative at 0.01.He denied palpitations, lower extremity edema, orthopnea, PND, dizziness or syncope.  No reports of any recent illnesses. Given his symptoms he was admitted and planned for cath.   Hospital Course    Underwent cardiac cath noted above with significant single vessel disease with widely patent pLAD stent with jailed 1st Diag 90% felt to be the culprit. Normal EF noted. He was resumed on DAPT with ASA/plavix. Consider angioplasty to the Diag if refractory angina in the future. He was started on metoprolol 25mg  BID and Imdur 30mg  daily. Noted to have financial issues with obtaining medications prior to admission. He was see by CM and  set up for [facility]. Rx were sent electrically prior to discharge. No complications noted post cath. Able to ambulate post cath without any recurrent chest pain.    General: Well developed, well nourished, male appearing in no acute distress. Head: Normocephalic, atraumatic.  Neck: Supple  without bruits, JVD. Lungs:  Resp regular and unlabored, CTA. Heart: RRR, S1, S2, no S3, S4, or murmur; no rub. Abdomen: Soft, non-tender, non-distended with normoactive bowel sounds.  Extremities: No clubbing, cyanosis, edema. Distal pedal pulses are 2+ bilaterally. R radial cath site stable without bruising or hematoma Neuro: Alert and oriented X 3. Moves all extremities spontaneously. Psych: Normal affect.  Sandy Salaam was seen by Dr. Herbie Baltimore and determined stable for discharge home. Follow up in the office has been arranged. Medications are listed below.   _____________  Discharge Vitals Blood pressure 118/74, pulse 65, temperature 98 F (36.7 C), temperature source Oral, resp. rate 12, height 5\' 10"  (1.778 m), weight 183 lb (83 kg), SpO2 99 %.  Filed Weights   11/11/17 0845 11/12/17 0705  Weight: 185 lb (83.9 kg) 183 lb (83 kg)    Labs & Radiologic Studies    CBC Recent Labs    11/11/17 1530 11/12/17 0316  WBC 6.6 6.9  NEUTROABS 3.8  --   HGB 15.7 16.1  HCT 44.8 46.5  MCV 91.8 92.6  PLT 328 340   Basic Metabolic Panel Recent Labs    40/98/11 1530 11/12/17 0316  NA 136 138  K 3.7 4.1  CL 102 103  CO2 22 23  GLUCOSE 98 94  BUN 8 9  CREATININE 0.86 0.96  CALCIUM 9.6 9.9   Liver Function Tests No results for input(s): AST, ALT, ALKPHOS, BILITOT, PROT, ALBUMIN in the last 72 hours. No results for input(s): LIPASE, AMYLASE in the last 72 hours. Cardiac Enzymes Recent Labs    11/11/17 1530 11/11/17 2107 11/12/17 0316  TROPONINI 0.05* 0.05* 0.04*   BNP Invalid input(s): POCBNP D-Dimer No results for input(s): DDIMER in the last 72 hours. Hemoglobin A1C No results for input(s): HGBA1C in the last 72 hours. Fasting Lipid Panel No results for input(s): CHOL, HDL, LDLCALC, TRIG, CHOLHDL, LDLDIRECT in the last 72 hours. Thyroid Function Tests No results for input(s): TSH, T4TOTAL, T3FREE, THYROIDAB in the last 72 hours.  Invalid input(s):  FREET3 _____________  Dg Chest 2 View  Result Date: 11/11/2017 CLINICAL DATA:  Onset of chest pain 45 minutes prior to admission. Symptoms were relieved with nitroglycerin but have returned. Patient ports associated shortness of breath with nausea and vomiting. EXAM: CHEST  2 VIEW COMPARISON:  Chest x-ray of May 01, 2016 FINDINGS: The lungs are well-expanded and clear. The interstitial markings are mildly prominent though stable. There is no alveolar infiltrate or pleural effusion. The heart and pulmonary vascularity are normal. The trachea is midline. The bony thorax exhibits no acute abnormality. IMPRESSION: There is no pneumonia nor pulmonary edema. Mild chronic bronchitic-smoking related changes, stable. All at Electronically Signed   By: David  [place] M.D.   On: 11/11/2017 09:00   Disposition   Pt is being discharged home today in good condition.  Follow-up Plans & Appointments    Follow-up Information    [facility] Follow up on 11/18/2017.   Why:  3:30 for hospital follow up Contact information: [address] [facility] [address] [phone]       Laurann Montana, PA-C Follow up on 11/21/2017.   Specialties:  Cardiology,  Radiology Why:  at 11am for your follow up appt. Please arrival 15 minute prior to check in.  Contact information: [address] [place] [address] [phone]          Discharge Instructions    Call MD for:  redness, tenderness, or signs of infection (pain, swelling, redness, odor or green/yellow discharge around incision site)   Complete by:  As directed    Diet - low sodium heart healthy   Complete by:  As directed    Discharge instructions   Complete by:  As directed    Radial Site Care Refer to this sheet in the next few weeks. These instructions provide you with information on caring for yourself after your procedure. Your caregiver may also give you more specific  instructions. Your treatment has been planned according to current medical practices, but problems sometimes occur. Call your caregiver if you have any problems or questions after your procedure. HOME CARE INSTRUCTIONS You may shower the day after the procedure.Remove the bandage (dressing) and gently wash the site with plain soap and water.Gently pat the site dry.  Do not apply powder or lotion to the site.  Do not submerge the affected site in water for 3 to 5 days.  Inspect the site at least twice daily.  Do not flex or bend the affected arm for 24 hours.  No lifting over 5 pounds (2.3 kg) for 5 days after your procedure.  Do not drive home if you are discharged the same day of the procedure. Have someone else drive you.  You may drive 24 hours after the procedure unless otherwise instructed by your caregiver.  What to expect: Any bruising will usually fade within 1 to 2 weeks.  Blood that collects in the tissue (hematoma) may be painful to the touch. It should usually decrease in size and tenderness within 1 to 2 weeks.  SEEK IMMEDIATE MEDICAL CARE IF: You have unusual pain at the radial site.  You have redness, warmth, swelling, or pain at the radial site.  You have drainage (other than a small amount of blood on the dressing).  You have chills.  You have a fever or persistent symptoms for more than 72 hours.  You have a fever and your symptoms suddenly get worse.  Your arm becomes pale, cool, tingly, or numb.  You have heavy bleeding from the site. Hold pressure on the site.   Please keep your scheduled follow up appts!   Increase activity slowly   Complete by:  As directed       Discharge Medications     Medication List    TAKE these medications   aspirin EC 81 MG tablet Take 1 tablet (81 mg total) by mouth daily.   atorvastatin 80 MG tablet Commonly known as:  LIPITOR Take 1 tablet (80 mg total) by mouth daily at 6 PM.   clopidogrel 75 MG tablet Commonly known as:   PLAVIX Take 1 tablet (75 mg total) by mouth daily.   isosorbide mononitrate 30 MG 24 hr tablet Commonly known as:  IMDUR Take 1 tablet (30 mg total) by mouth at bedtime.   metoprolol tartrate 25 MG tablet Commonly known as:  LOPRESSOR Take 1 tablet (25 mg total) by mouth 2 (two) times daily.   nitroGLYCERIN 0.4 MG SL tablet Commonly known as:  NITROSTAT Place 1 tablet (0.4 mg total) under the tongue every 5 (five) minutes as needed for chest pain.  Aspirin prescribed at discharge?  Yes High Intensity Statin Prescribed? (Lipitor 40-80mg  or Crestor 20-40mg ): Yes Beta Blocker Prescribed? Yes For EF <40%, was ACEI/ARB Prescribed? No: EF ok ADP Receptor Inhibitor Prescribed? (i.e. Plavix etc.-Includes Medically Managed Patients): Yes For EF <40%, Aldosterone Inhibitor Prescribed? No: EF ok Was EF assessed during THIS hospitalization? Yes Was Cardiac Rehab II ordered? (Included Medically managed Patients): Yes   Outstanding Labs/Studies   FLP/LFTs in 6 weeks if tolerating statin.   Duration of Discharge Encounter   Greater than 30 minutes including physician time.  Signed, Laverda PageLindsay Froilan Mclean NP-C 11/13/2017, 7:18 AM

## 2017-11-12 NOTE — Interval H&P Note (Signed)
Cath Lab Visit (complete for each Cath Lab visit)  Clinical Evaluation Leading to the Procedure:   ACS: Yes.    Non-ACS:  n/a      History and Physical Interval Note:  11/12/2017 1:14 PM  Ricky [name]  has presented today for surgery, with the diagnosis of chest pain  The various methods of treatment have been discussed with the patient and family. After consideration of risks, benefits and other options for treatment, the patient has consented to  Procedure(s): LEFT HEART CATH AND CORONARY ANGIOGRAPHY (N/A) as a surgical intervention .  The patient's history has been reviewed, patient examined, no change in status, stable for surgery.  I have reviewed the patient's chart and labs.  Questions were answered to the patient's satisfaction.     Lorine BearsMuhammad Amil Moseman

## 2017-11-12 NOTE — Care Management Note (Addendum)
Case Management Note  Patient Details  Name: Ricky [name] MRN: [id number] Date of Birth: 07/19/1976  Subjective/Objective:  From home, s/p cath will be on plavix, patient does not have any insurance and no PCP, [facility].   [facility] utilize [facility] pharmacy for medication assistance.                  Action/Plan: NCM will follow for dc needs.   Expected Discharge Date:                  Expected Discharge Plan:  Home/Self Care  In-House Referral:     Discharge planning Services  CM Consult, Medication Assistance, Indigent Health Clinic, Follow-up appt scheduled  Post Acute Care Choice:    Choice offered to:     DME Arranged:    DME Agency:     HH Arranged:    HH Agency:     Status of Service:  Completed, signed off  If discussed at [employer] Meetings, dates discussed:    Additional Comments:  Leone Havenaylor, Latica Hohmann Clinton, RN 11/12/2017, 3:12 PM

## 2017-11-12 NOTE — Progress Notes (Signed)
ANTICOAGULATION CONSULT NOTE - Follow Up Consult  Pharmacy Consult for Heparin Indication: chest pain/ACS  No Known Allergies  Patient Measurements: Height: 5\' 10"  (177.8 cm) Weight: 183 lb (83 kg) IBW/kg (Calculated) : 73 Heparin Dosing Weight: 83 kg  Vital Signs: Temp: 98.1 F (36.7 C) (01/16 0705) Temp Source: Oral (01/16 0705) BP: 122/73 (01/16 0705) Pulse Rate: 62 (01/16 0705)  Labs: Recent Labs    11/11/17 0839  11/11/17 1530 11/11/17 2107 11/12/17 0316  HGB 16.0  --  15.7  --  16.1  HCT 46.3  --  44.8  --  46.5  PLT 343  --  328  --  340  LABPROT  --   --   --   --  13.4  INR  --   --   --   --  1.03  HEPARINUNFRC  --   --   --  0.16* 0.64  CREATININE 0.97  --  0.86  --  0.96  TROPONINI  --    < > 0.05* 0.05* 0.04*   < > = values in this interval not displayed.    Estimated Creatinine Clearance: 106.7 mL/min (by C-G formula based on SCr of 0.96 mg/dL).  Assessment:   42 yr old presented to ED with chest pain on 1/15.   Continues on IV heparin.    Heparin level therapeutic (0.64) this morning on 1400 units/hr.   For cardiac cath today.  Goal of Therapy:  Heparin level 0.3-0.7 units/ml Monitor platelets by anticoagulation protocol: Yes   Plan:   Continue heparin drip at 1400 units/hr.  Daily heparin level and CBC while on heparin.  Follow up post-cath.  Dennie FettersEgan, Mollye Guinta Donovan, RPh Pager: [phone] 11/12/2017,11:17 AM

## 2017-11-13 ENCOUNTER — Encounter (HOSPITAL_COMMUNITY): Payer: Self-pay | Admitting: Cardiovascular Disease

## 2017-11-18 ENCOUNTER — Ambulatory Visit: Payer: Self-pay | Attending: Internal Medicine | Admitting: Internal Medicine

## 2017-11-18 ENCOUNTER — Encounter: Payer: Self-pay | Admitting: Internal Medicine

## 2017-11-18 VITALS — BP 117/75 | HR 59 | Temp 99.5°F | Ht 68.0 in | Wt 186.2 lb

## 2017-11-18 DIAGNOSIS — Z955 Presence of coronary angioplasty implant and graft: Secondary | ICD-10-CM | POA: Insufficient documentation

## 2017-11-18 DIAGNOSIS — F329 Major depressive disorder, single episode, unspecified: Secondary | ICD-10-CM | POA: Insufficient documentation

## 2017-11-18 DIAGNOSIS — G4452 New daily persistent headache (NDPH): Secondary | ICD-10-CM | POA: Insufficient documentation

## 2017-11-18 DIAGNOSIS — F32A Depression, unspecified: Secondary | ICD-10-CM

## 2017-11-18 DIAGNOSIS — F419 Anxiety disorder, unspecified: Secondary | ICD-10-CM | POA: Insufficient documentation

## 2017-11-18 DIAGNOSIS — Z87891 Personal history of nicotine dependence: Secondary | ICD-10-CM | POA: Insufficient documentation

## 2017-11-18 DIAGNOSIS — I252 Old myocardial infarction: Secondary | ICD-10-CM | POA: Insufficient documentation

## 2017-11-18 DIAGNOSIS — E785 Hyperlipidemia, unspecified: Secondary | ICD-10-CM | POA: Insufficient documentation

## 2017-11-18 DIAGNOSIS — M47892 Other spondylosis, cervical region: Secondary | ICD-10-CM | POA: Insufficient documentation

## 2017-11-18 DIAGNOSIS — Z7902 Long term (current) use of antithrombotics/antiplatelets: Secondary | ICD-10-CM | POA: Insufficient documentation

## 2017-11-18 DIAGNOSIS — Z720 Tobacco use: Secondary | ICD-10-CM

## 2017-11-18 DIAGNOSIS — I25118 Atherosclerotic heart disease of native coronary artery with other forms of angina pectoris: Secondary | ICD-10-CM | POA: Insufficient documentation

## 2017-11-18 MED ORDER — METOPROLOL TARTRATE 25 MG PO TABS: 25.0000 mg | ORAL_TABLET | Freq: Two times a day (BID) | ORAL | 6 refills | Status: DC

## 2017-11-18 MED ORDER — ASPIRIN EC 81 MG PO TBEC: 81.0000 mg | DELAYED_RELEASE_TABLET | Freq: Every day | ORAL | 3 refills | Status: DC

## 2017-11-18 MED ORDER — ESCITALOPRAM OXALATE 10 MG PO TABS: 10.0000 mg | ORAL_TABLET | Freq: Every day | ORAL | 2 refills | Status: DC

## 2017-11-18 MED ORDER — CLOPIDOGREL BISULFATE 75 MG PO TABS: 75.0000 mg | ORAL_TABLET | Freq: Every day | ORAL | 1 refills | Status: DC

## 2017-11-18 MED ORDER — ATORVASTATIN CALCIUM 80 MG PO TABS: 80.0000 mg | ORAL_TABLET | Freq: Every day | ORAL | 5 refills | Status: DC

## 2017-11-18 MED FILL — ESCITALOPRAM 10 MG TABLET: 10 | 30 days supply | Qty: 30 | Fill #0

## 2017-11-18 NOTE — Progress Notes (Signed)
Patient ID: Ricky [name], male    DOB: 07/23/1976  MRN: [id number]  CC: Hospitalization Follow-up   Subjective: Ricky [name] is a 42 y.o. male who presents for chronic ds management/hosp f/u.  Last seen 03/2017 His concerns today include:  42 year old male with history of coronary artery disease, HL, depression/anxiety, cervical spondylosis.  Pt hosp 1/15-17/2019 with CP/USA Cardiac catheterization revealed progressive narrowing in the diagonal branch.  The cardiologist did not feel that the vessel was large and enough to permit PTCA.  Medical management recommended.  Patient started on metoprolol, indoor, and Plavix.  Atorvastatin and aspirin were continued..  Since hospital discharge he has had CP 1-2 x.  Last few mins. Did not take the SL Nitro Complains of persistent daily headaches since being on Imdur -He is vaping as a way of trying to wean off of nicotine.  He hopes to quit the vaping soon   Anxiety/depression: has been off Lexapro and Buspar for a while -did not feel right on the Buspar so he stopped both.  [facility] -would like to get back on Lexapro   Patient Active Problem List   Diagnosis Date Noted  . Dyslipidemia 11/11/2017  . Unstable angina (HCC) 11/11/2017  . Depression (emotion) 05/14/2016  . Tobacco abuse   . Coronary artery disease involving native coronary artery of native heart with unstable angina pectoris (HCC)   . Chest pain 05/02/2016  . Elevated troponin 05/02/2016  . NSTEMI (non-ST elevated myocardial infarction) [facility](HCC)      Current Outpatient Medications on File Prior to Visit  Medication Sig Dispense Refill  . nitroGLYCERIN (NITROSTAT) 0.4 MG SL tablet Place 1 tablet (0.4 mg total) under the tongue every 5 (five) minutes as needed for chest pain. 25 tablet 0   No current facility-administered medications on file prior to visit.     No Known Allergies  Social History   Socioeconomic History  . Marital status:  Divorced    Spouse name: Not on file  . Number of children: Not on file  . Years of education: Not on file  . Highest education level: Not on file  Social Needs  . Financial resource strain: Not on file  . Food insecurity - worry: Not on file  . Food insecurity - inability: Not on file  . Transportation needs - medical: Not on file  . Transportation needs - non-medical: Not on file  Occupational History  . Not on file  Tobacco Use  . Smoking status: Former Smoker    Packs/day: 1.00    Years: 21.00    Pack years: 21.00    Types: Cigars, Cigarettes  . Smokeless tobacco: Never Used  . Tobacco comment: 11/11/2017 "quit later part of 2018"  Substance and Sexual Activity  . Alcohol use: No    Alcohol/week: 0.0 oz  . Drug use: Yes    Types: Marijuana    Comment: 11/11/2017 "qd"  . Sexual activity: Not Currently    Birth control/protection: Condom  Other Topics Concern  . Not on file  Social History Narrative  . Not on file    Family History  Problem Relation Age of Onset  . Healthy Mother   . Healthy Father   . Stroke Maternal Aunt   . Heart attack Maternal Grandmother 34       deceased  . Heart attack Paternal Grandfather     Past Surgical History:  Procedure Laterality Date  . ABSCESS DRAINAGE  ~ 2014   "  armpit"  . CARDIAC CATHETERIZATION N/A 05/02/2016   Procedure: Left Heart Cath and Coronary Angiography;  Surgeon: Corky Crafts, MD;  [facility] INVASIVE CV LAB;  Service: Cardiovascular;  Laterality: N/A;  . CARDIAC CATHETERIZATION N/A 05/02/2016   Procedure: Coronary Stent Intervention;  Surgeon: Corky Crafts, MD;  Location: [facility] INVASIVE CV LAB;  Service: Cardiovascular;  Laterality: N/A;  . LEFT HEART CATH AND CORONARY ANGIOGRAPHY N/A 11/12/2017   Procedure: LEFT HEART CATH AND CORONARY ANGIOGRAPHY;  Surgeon: Iran Ouch, MD;  Location: [facility] INVASIVE CV LAB;  Service: Cardiovascular;  Laterality: N/A;    ROS: Review of Systems Negative except as  stated above  PHYSICAL EXAM: BP 117/75   Pulse (!) 59   Temp 99.5 F (37.5 C) (Oral)   Ht 5\' 8"  (1.727 m)   Wt 186 lb 3.2 oz (84.5 kg)   SpO2 96%   BMI 28.31 kg/m   Physical Exam General appearance - alert, well appearing, middle-aged Caucasian male and in no distress Mental status - alert, oriented to person, place, and time, normal mood, behavior, speech, dress, motor activity, and thought processes Neck - supple, no significant adenopathy Chest - clear to auscultation, no wheezes, rales or rhonchi, symmetric air entry Heart - normal rate, regular rhythm, normal S1, S2, no murmurs, rubs, clicks or gallops Extremities - peripheral pulses normal, no pedal edema, no clubbing or cyanosis   ASSESSMENT AND PLAN: 1. Coronary artery disease of native artery of native heart with stable angina pectoris (HCC) -Continue beta-blocker, statin, Plavix, and aspirin.  The headaches very bothersome to him so we will have him stop the isosorbide.  He has a follow-up with cardiology later this week.  2. New daily persistent headache See #1 above.  3. Tobacco abuse Commended him on his efforts to try to quit tobacco  4. Anxiety and depression - escitalopram (LEXAPRO) 10 MG tablet; Take 1 tablet (10 mg total) by mouth daily.  Dispense: 30 tablet; Refill: 2  Patient was given the opportunity to ask questions.  Patient verbalized understanding of the plan and was able to repeat key elements of the plan.   No orders of the defined types were placed in this encounter.    Requested Prescriptions   Signed Prescriptions Disp Refills  . metoprolol tartrate (LOPRESSOR) 25 MG tablet 60 tablet 6    Sig: Take 1 tablet (25 mg total) by mouth 2 (two) times daily.  . clopidogrel (PLAVIX) 75 MG tablet 90 tablet 1    Sig: Take 1 tablet (75 mg total) by mouth daily.  Marland Kitchen atorvastatin (LIPITOR) 80 MG tablet 30 tablet 5    Sig: Take 1 tablet (80 mg total) by mouth daily at 6 PM.  . aspirin EC 81 MG tablet 90  tablet 3    Sig: Take 1 tablet (81 mg total) by mouth daily.  Marland Kitchen escitalopram (LEXAPRO) 10 MG tablet 30 tablet 2    Sig: Take 1 tablet (10 mg total) by mouth daily.    Return in about 3 months (around 02/16/2018).  Jonah Blue, MD, FACP

## 2017-11-18 NOTE — Progress Notes (Signed)
Pt states that IMDUR is giving him a headache.

## 2017-11-18 NOTE — Patient Instructions (Signed)
Stop Isosorbide. Restart Lexapro as discussed.

## 2017-11-21 ENCOUNTER — Ambulatory Visit (INDEPENDENT_AMBULATORY_CARE_PROVIDER_SITE_OTHER): Payer: Self-pay | Admitting: Physician Assistant

## 2017-11-21 ENCOUNTER — Encounter: Payer: Self-pay | Admitting: Physician Assistant

## 2017-11-21 VITALS — BP 120/62 | HR 69 | Ht 68.0 in | Wt 186.0 lb

## 2017-11-21 DIAGNOSIS — E785 Hyperlipidemia, unspecified: Secondary | ICD-10-CM

## 2017-11-21 DIAGNOSIS — Z720 Tobacco use: Secondary | ICD-10-CM

## 2017-11-21 DIAGNOSIS — R001 Bradycardia, unspecified: Secondary | ICD-10-CM

## 2017-11-21 DIAGNOSIS — I2510 Atherosclerotic heart disease of native coronary artery without angina pectoris: Secondary | ICD-10-CM

## 2017-11-21 NOTE — Patient Instructions (Signed)
Medication Instructions:  Your physician recommends that you continue on your current medications as directed. Please refer to the Current Medication list given to you today.   Labwork: Your physician recommends that you return for a FASTING lipid profile: lipid/lft on February 22, between 8-5.   Testing/Procedures: -None  Follow-Up: Your physician recommends that you keep your scheduled  follow-up appointment with Dr. Clifton JamesMcAlhany.   Any Other Special Instructions Will Be Listed Below (If Applicable).     If you need a refill on your cardiac medications before your next appointment, please call your pharmacy.
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
## 2017-11-24 NOTE — Addendum Note (Signed)
Addended by: Micki RileySHOFFNER, Donata Reddick C on: 11/24/2017 01:16 PM   Modules accepted: Orders

## 2017-12-11 ENCOUNTER — Other Ambulatory Visit: Payer: Self-pay | Admitting: Cardiology

## 2017-12-11 MED FILL — ESCITALOPRAM 10 MG TABLET: 10 | 30 days supply | Qty: 30 | Fill #1

## 2017-12-11 MED FILL — ?METOPROLOL 25 MG TABLET: 25 | 30 days supply | Qty: 60 | Fill #0

## 2017-12-11 MED FILL — ?CLOPIDOGREL 75MG TABLET: 75 | 30 days supply | Qty: 30 | Fill #0

## 2017-12-12 MED FILL — ISOSORBIDE MN ER 30 MG TAB: 30 | 30 days supply | Qty: 30 | Fill #0

## 2017-12-12 MED FILL — ATORVASTATIN 80 MG TABLET: 80 | 30 days supply | Qty: 30 | Fill #0

## 2017-12-19 ENCOUNTER — Encounter (INDEPENDENT_AMBULATORY_CARE_PROVIDER_SITE_OTHER): Payer: Self-pay

## 2017-12-19 ENCOUNTER — Other Ambulatory Visit: Payer: Self-pay | Admitting: *Deleted

## 2017-12-19 DIAGNOSIS — E785 Hyperlipidemia, unspecified: Secondary | ICD-10-CM

## 2017-12-19 LAB — HEPATIC FUNCTION PANEL
ALT: 21 IU/L (ref 0–44)
AST: 18 IU/L (ref 0–40)
Albumin: 4.4 g/dL (ref 3.5–5.5)
Alkaline Phosphatase: 82 IU/L (ref 39–117)
BILIRUBIN, DIRECT: 0.08 mg/dL (ref 0.00–0.40)
Bilirubin Total: 0.2 mg/dL (ref 0.0–1.2)
Total Protein: 6.7 g/dL (ref 6.0–8.5)

## 2017-12-19 LAB — LIPID PANEL
CHOLESTEROL TOTAL: 123 mg/dL (ref 100–199)
Chol/HDL Ratio: 3.6 ratio (ref 0.0–5.0)
HDL: 34 mg/dL — AB (ref >39)
LDL Calculated: 55 mg/dL (ref 0–99)
TRIGLYCERIDES: 171 mg/dL — AB (ref 0–149)
VLDL Cholesterol Cal: 34 mg/dL (ref 5–40)

## 2017-12-22 NOTE — Progress Notes (Signed)
Pt has been made aware of normal result and verbalized understanding.  jw 12/22/17

## 2018-01-12 MED FILL — CLOPIDOGREL 75 MG TABLET: 75 | 30 days supply | Qty: 30 | Fill #1

## 2018-01-12 MED FILL — METOPROLOL TARTRATE 25 MG T: 25 | 30 days supply | Qty: 60 | Fill #1

## 2018-01-12 MED FILL — ISOSORBIDE MN ER 30 MG TAB: 30 | 30 days supply | Qty: 30 | Fill #1

## 2018-01-12 MED FILL — ESCITALOPRAM 10 MG TABLET: 10 | 30 days supply | Qty: 30 | Fill #2

## 2018-01-12 MED FILL — ATORVASTATIN 80 MG TABLET: 80 | 30 days supply | Qty: 30 | Fill #1

## 2018-02-12 MED FILL — CLOPIDOGREL 75 MG TABLET: 75 | 30 days supply | Qty: 30 | Fill #2

## 2018-02-16 ENCOUNTER — Ambulatory Visit: Payer: Self-pay | Admitting: Internal Medicine

## 2018-02-17 ENCOUNTER — Other Ambulatory Visit: Payer: Self-pay | Admitting: Internal Medicine

## 2018-02-17 DIAGNOSIS — F419 Anxiety disorder, unspecified: Principal | ICD-10-CM

## 2018-02-17 DIAGNOSIS — F32A Depression, unspecified: Secondary | ICD-10-CM

## 2018-02-17 DIAGNOSIS — F329 Major depressive disorder, single episode, unspecified: Secondary | ICD-10-CM

## 2018-02-17 MED FILL — ESCITALOPRAM 10 MG TABLET: 10 | 30 days supply | Qty: 30 | Fill #0

## 2018-02-17 MED FILL — ATORVASTATIN 80 MG TABLET: 80 | 30 days supply | Qty: 30 | Fill #2

## 2018-02-17 MED FILL — ISOSORBIDE MN ER 30 MG TAB: 30 | 30 days supply | Qty: 30 | Fill #2

## 2018-02-17 MED FILL — METOPROLOL TARTRATE 25 MG T: 25 | 30 days supply | Qty: 60 | Fill #2

## 2018-02-27 ENCOUNTER — Encounter: Payer: Self-pay | Admitting: Cardiovascular Disease

## 2018-02-27 ENCOUNTER — Ambulatory Visit (INDEPENDENT_AMBULATORY_CARE_PROVIDER_SITE_OTHER): Payer: Self-pay | Admitting: Cardiovascular Disease

## 2018-02-27 ENCOUNTER — Encounter (INDEPENDENT_AMBULATORY_CARE_PROVIDER_SITE_OTHER): Payer: Self-pay

## 2018-02-27 VITALS — BP 104/70 | HR 63 | Ht 68.0 in | Wt 187.4 lb

## 2018-02-27 DIAGNOSIS — E7800 Pure hypercholesterolemia, unspecified: Secondary | ICD-10-CM

## 2018-02-27 DIAGNOSIS — I25118 Atherosclerotic heart disease of native coronary artery with other forms of angina pectoris: Secondary | ICD-10-CM

## 2018-02-27 DIAGNOSIS — Z720 Tobacco use: Secondary | ICD-10-CM

## 2018-02-27 NOTE — Patient Instructions (Signed)
Medication Instructions:  Your provider recommends that you continue on your current medications as directed. Please refer to the Current Medication list given to you today.    Labwork: None  Testing/Procedures: None  Follow-Up: Your provider wants you to follow-up in: 1 year with Dr. McAlhany. You will receive a reminder letter in the mail two months in advance. If you don't receive a letter, please call our office to schedule the follow-up appointment.    Any Other Special Instructions Will Be Listed Below (If Applicable).     If you need a refill on your cardiac medications before your next appointment, please call your pharmacy.   
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
## 2018-03-18 MED FILL — ESCITALOPRAM 10 MG TABLET: 10 | 30 days supply | Qty: 30 | Fill #1

## 2018-03-18 MED FILL — CLOPIDOGREL 75 MG TABLET: 75 | 30 days supply | Qty: 30 | Fill #3

## 2018-03-18 MED FILL — ATORVASTATIN 80 MG TABLET: 80 | 30 days supply | Qty: 30 | Fill #3

## 2018-03-18 MED FILL — METOPROLOL TARTRATE 25 MG T: 25 | 30 days supply | Qty: 60 | Fill #3

## 2018-04-20 MED FILL — CLOPIDOGREL 75 MG TABLET: 75 | 30 days supply | Qty: 30 | Fill #4

## 2018-04-20 MED FILL — ATORVASTATIN 80 MG TABLET: 80 | 30 days supply | Qty: 30 | Fill #4

## 2018-04-20 MED FILL — ISOSORBIDE MN ER 30 MG TAB: 30 | 30 days supply | Qty: 30 | Fill #3

## 2018-04-20 MED FILL — METOPROLOL TARTRATE 25 MG T: 25 | 30 days supply | Qty: 60 | Fill #4

## 2018-05-21 MED FILL — METOPROLOL TARTRATE 25 MG T: 25 | 30 days supply | Qty: 60 | Fill #5

## 2018-05-21 MED FILL — ISOSORBIDE MN ER 30 MG TAB: 30 | 30 days supply | Qty: 30 | Fill #4

## 2018-05-21 MED FILL — ATORVASTATIN 80 MG TABLET: 80 | 30 days supply | Qty: 30 | Fill #5

## 2018-05-21 MED FILL — CLOPIDOGREL 75 MG TABLET: 75 | 30 days supply | Qty: 30 | Fill #5

## 2018-06-22 MED FILL — ISOSORBIDE MN ER 30 MG TAB: 30 | 30 days supply | Qty: 30 | Fill #5

## 2018-06-22 MED FILL — METOPROLOL TARTRATE 25 MG T: 25 | 30 days supply | Qty: 60 | Fill #6

## 2018-06-22 MED FILL — ATORVASTATIN 80 MG TABLET: 80 | 30 days supply | Qty: 30 | Fill #6

## 2018-06-22 MED FILL — CLOPIDOGREL 75 MG TABLET: 75 | 30 days supply | Qty: 30 | Fill #2

## 2018-07-27 ENCOUNTER — Other Ambulatory Visit: Payer: Self-pay | Admitting: Internal Medicine

## 2018-07-27 MED FILL — CLOPIDOGREL 75 MG TABLET: 75 | 30 days supply | Qty: 30 | Fill #3

## 2018-07-27 MED FILL — METOPROLOL TARTRATE 25 MG T: 25 | 30 days supply | Qty: 60 | Fill #0

## 2018-07-27 MED FILL — ISOSORBIDE MN ER 30 MG TAB: 30 | 30 days supply | Qty: 30 | Fill #6

## 2018-07-27 MED FILL — ATORVASTATIN 80 MG TABLET: 80 | 30 days supply | Qty: 30 | Fill #7

## 2018-08-28 ENCOUNTER — Other Ambulatory Visit: Payer: Self-pay | Admitting: Cardiovascular Disease

## 2018-08-28 MED FILL — ATORVASTATIN 80 MG TABLET: 80 | 30 days supply | Qty: 30 | Fill #8

## 2018-08-28 MED FILL — ISOSORBIDE MN ER 30 MG TAB: 30 | 30 days supply | Qty: 30 | Fill #7

## 2018-08-28 MED FILL — CLOPIDOGREL 75 MG TABLET: 75 | 30 days supply | Qty: 30 | Fill #0

## 2018-10-06 MED FILL — CLOPIDOGREL 75 MG TABLET: 75 | 30 days supply | Qty: 30 | Fill #1

## 2018-10-06 MED FILL — ISOSORBIDE MN ER 30 MG TAB: 30 | 30 days supply | Qty: 30 | Fill #8

## 2018-10-06 MED FILL — ATORVASTATIN 80 MG TABLET: 80 | 30 days supply | Qty: 30 | Fill #9

## 2018-11-17 MED FILL — CLOPIDOGREL 75 MG TABLET: 75 | 30 days supply | Qty: 30 | Fill #2

## 2018-11-17 MED FILL — ISOSORBIDE MN ER 30 MG TAB: 30 | 30 days supply | Qty: 30 | Fill #9

## 2018-11-17 MED FILL — ATORVASTATIN 80 MG TABLET: 80 | 30 days supply | Qty: 30 | Fill #10

## 2018-12-29 MED FILL — CLOPIDOGREL 75 MG TABLET: 75 | 30 days supply | Qty: 30 | Fill #3

## 2019-02-14 IMAGING — CR DG CHEST 2V
2 series · 2 of 2 positions shown · non-contrast
Comparison: Chest x-ray of May 01, 2016

CLINICAL DATA: Onset of chest pain 45 minutes prior to admission.
Symptoms were relieved with nitroglycerin but have returned. Patient
ports associated shortness of breath with nausea and vomiting.

EXAM:
CHEST  2 VIEW

[chest pa]
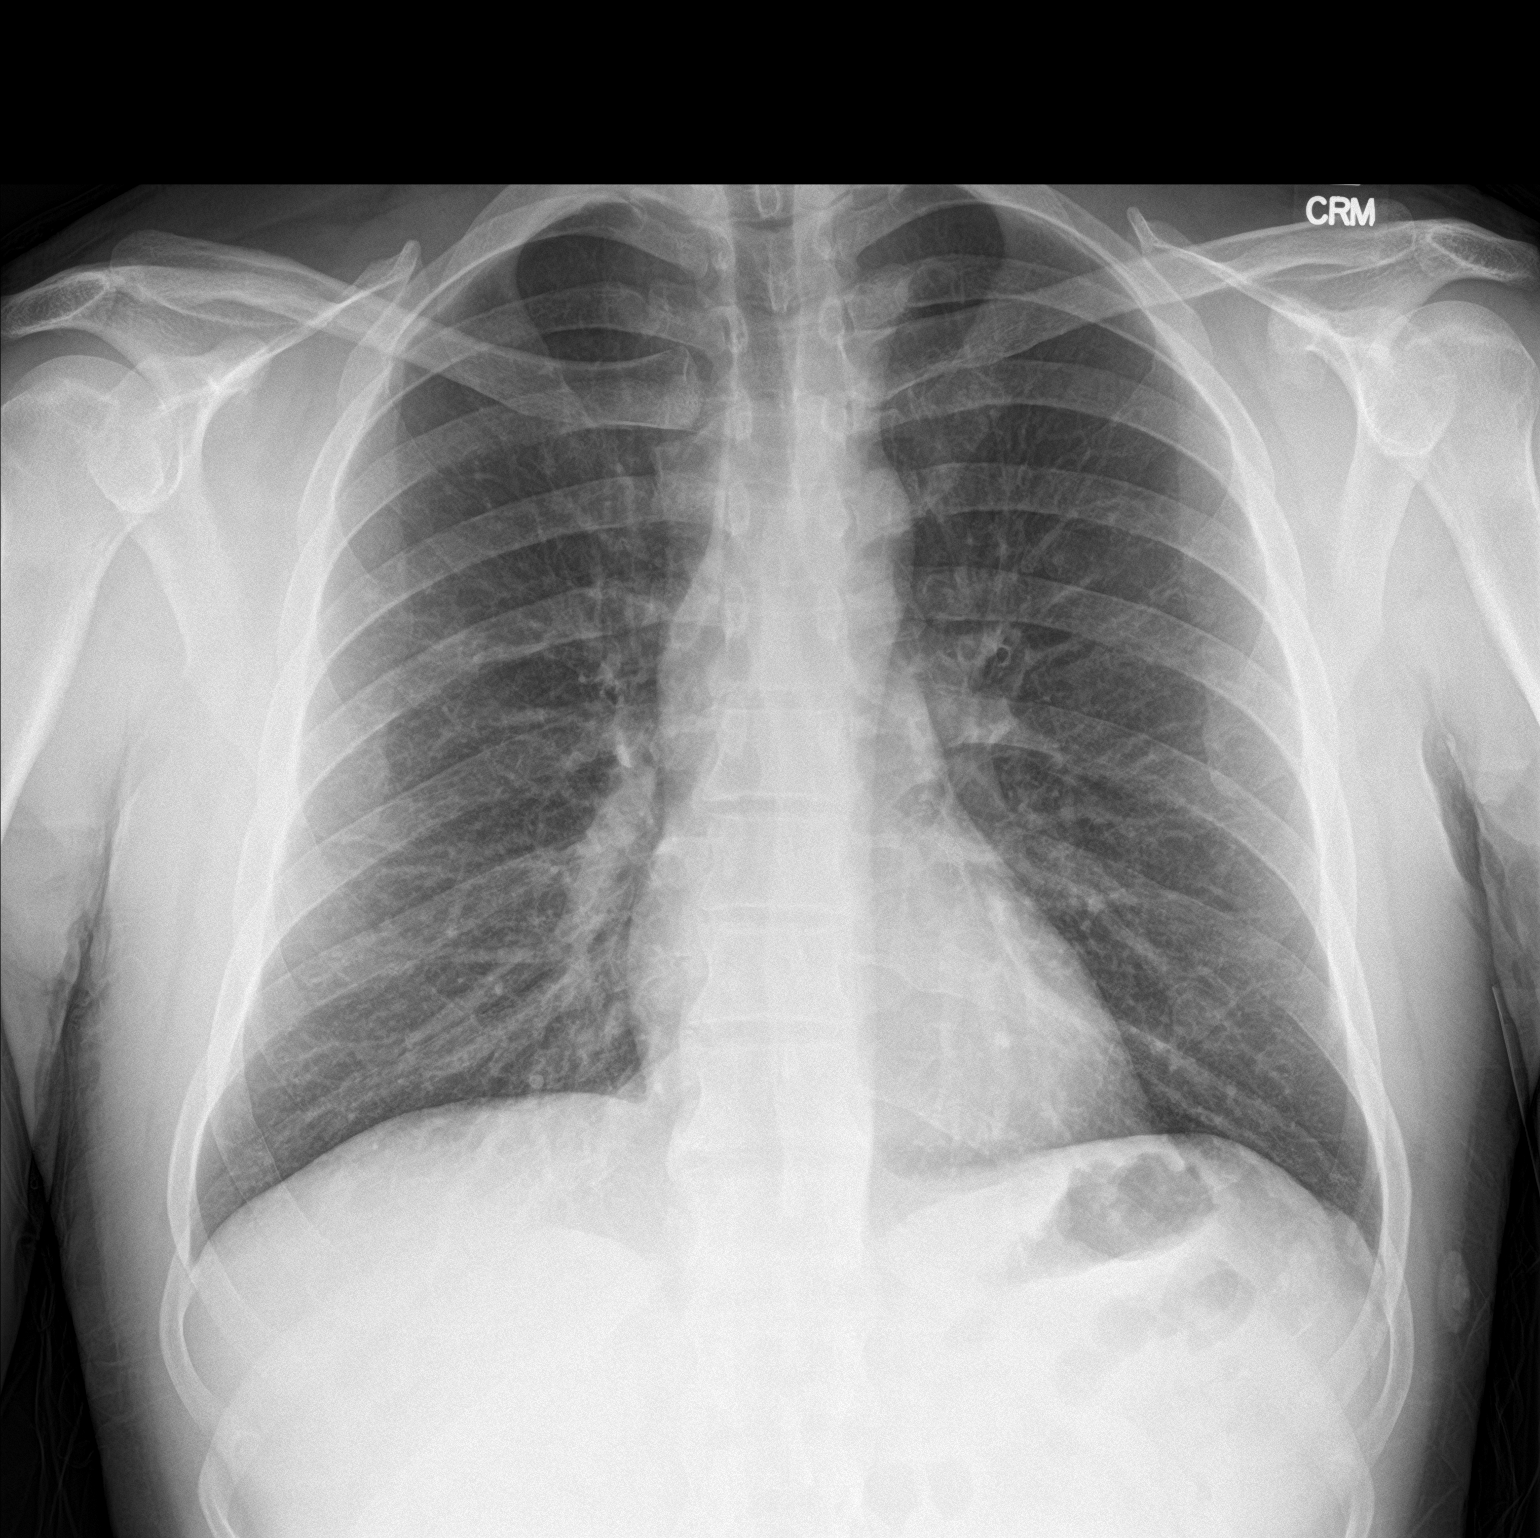

[chest lat]
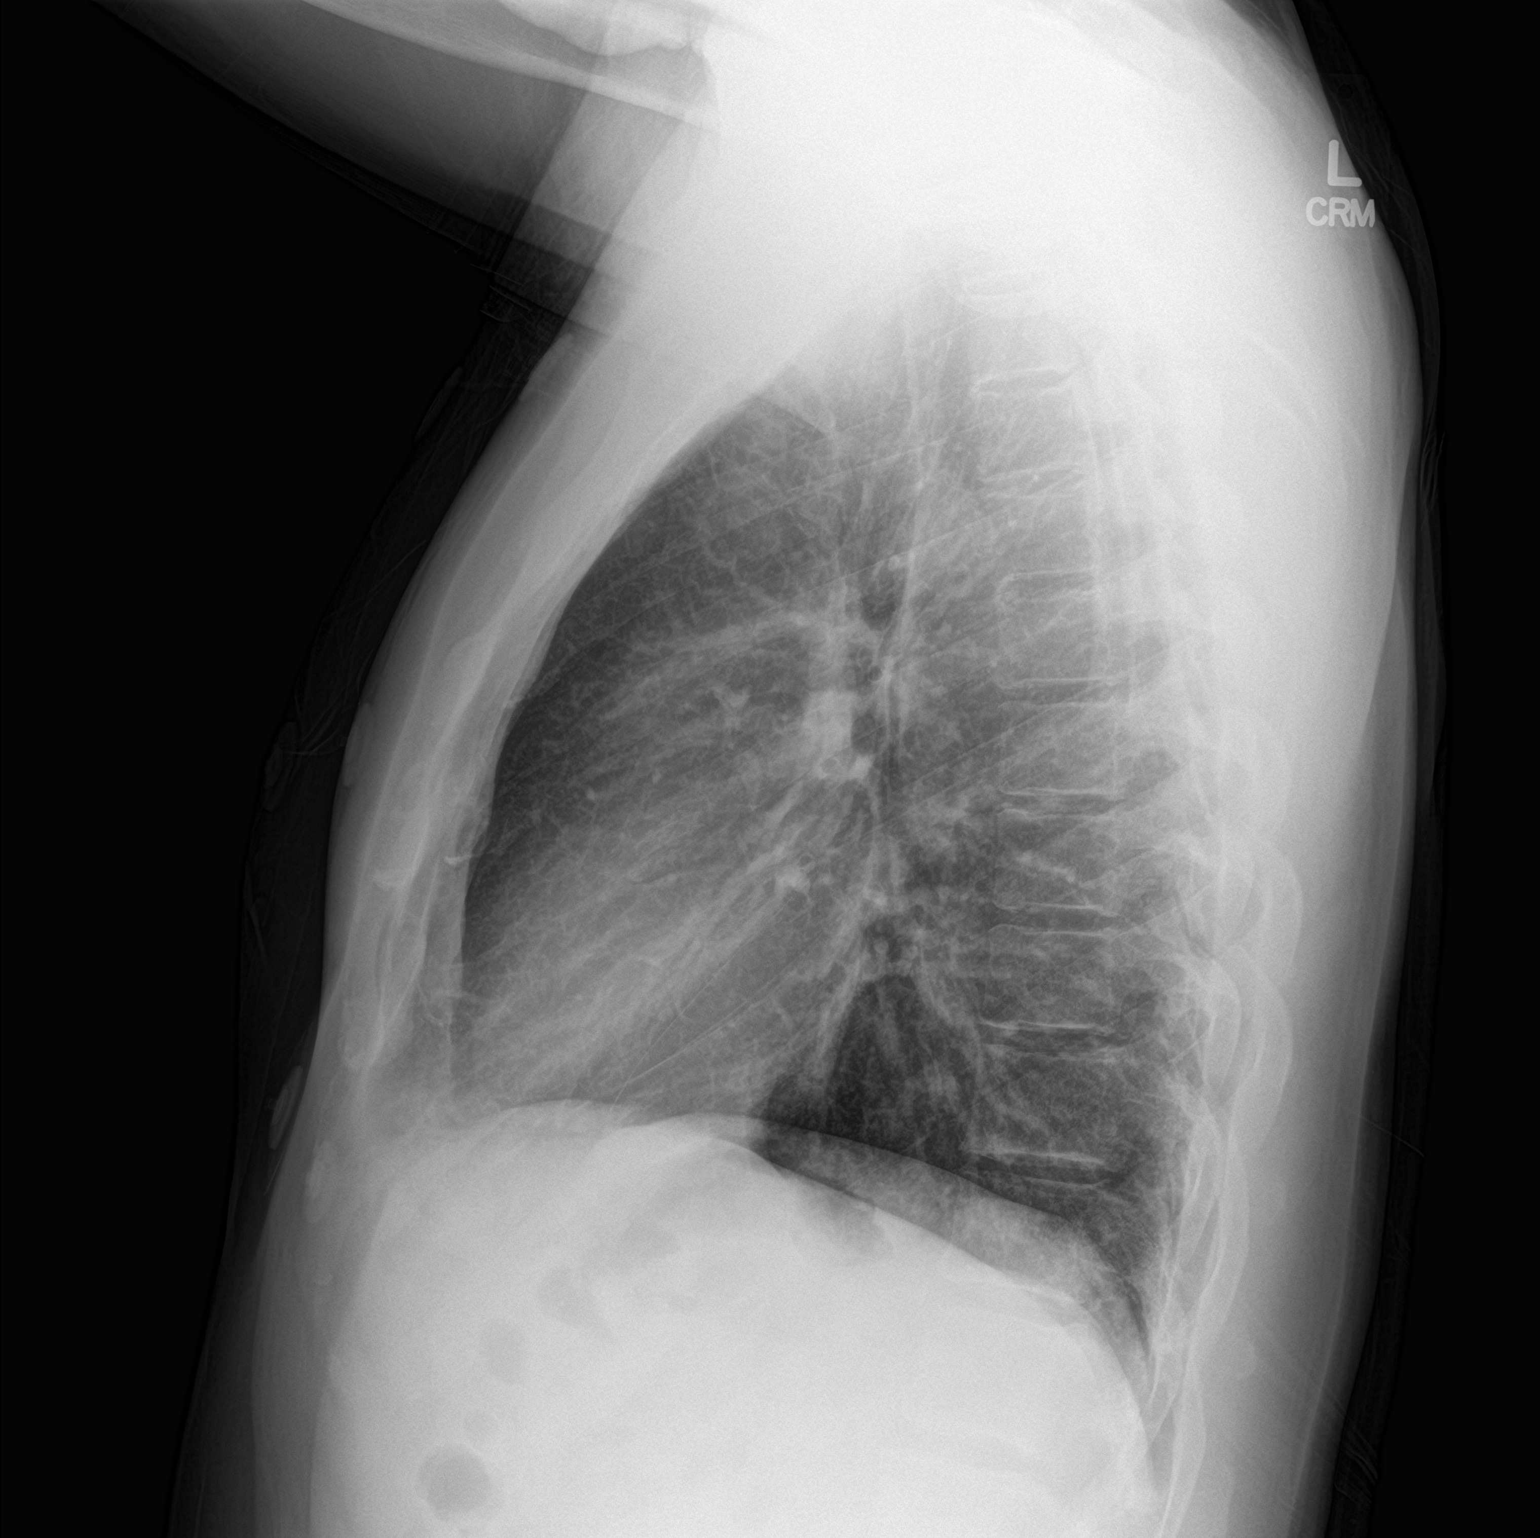

[2 of 2 positions shown; findings below may reference images not displayed]

FINDINGS: The lungs are well-expanded and clear. The interstitial markings are
mildly prominent though stable. There is no alveolar infiltrate or
pleural effusion. The heart and pulmonary vascularity are normal.
The trachea is midline. The bony thorax exhibits no acute
abnormality.
IMPRESSION: There is no pneumonia nor pulmonary edema. Mild chronic
bronchitic-smoking related changes, stable. All at

## 2020-04-06 ENCOUNTER — Encounter: Payer: Self-pay | Admitting: General Practice

## 2024-02-21 DIAGNOSIS — Y848 Other medical procedures as the cause of abnormal reaction of the patient, or of later complication, without mention of misadventure at the time of the procedure: Secondary | ICD-10-CM | POA: Diagnosis not present

## 2024-02-21 DIAGNOSIS — F1290 Cannabis use, unspecified, uncomplicated: Secondary | ICD-10-CM | POA: Diagnosis present

## 2024-02-21 DIAGNOSIS — I25110 Atherosclerotic heart disease of native coronary artery with unstable angina pectoris: Secondary | ICD-10-CM | POA: Diagnosis present

## 2024-02-21 DIAGNOSIS — R008 Other abnormalities of heart beat: Secondary | ICD-10-CM | POA: Diagnosis not present

## 2024-02-21 DIAGNOSIS — I2102 ST elevation (STEMI) myocardial infarction involving left anterior descending coronary artery: Secondary | ICD-10-CM | POA: Diagnosis present

## 2024-02-21 DIAGNOSIS — Z716 Tobacco abuse counseling: Secondary | ICD-10-CM

## 2024-02-21 DIAGNOSIS — Z8249 Family history of ischemic heart disease and other diseases of the circulatory system: Secondary | ICD-10-CM

## 2024-02-21 DIAGNOSIS — T82867A Thrombosis of cardiac prosthetic devices, implants and grafts, initial encounter: Principal | ICD-10-CM | POA: Diagnosis present

## 2024-02-21 DIAGNOSIS — Y831 Surgical operation with implant of artificial internal device as the cause of abnormal reaction of the patient, or of later complication, without mention of misadventure at the time of the procedure: Secondary | ICD-10-CM | POA: Diagnosis present

## 2024-02-21 DIAGNOSIS — L7632 Postprocedural hematoma of skin and subcutaneous tissue following other procedure: Secondary | ICD-10-CM | POA: Diagnosis not present

## 2024-02-21 DIAGNOSIS — F17290 Nicotine dependence, other tobacco product, uncomplicated: Secondary | ICD-10-CM | POA: Diagnosis present

## 2024-02-21 DIAGNOSIS — E785 Hyperlipidemia, unspecified: Secondary | ICD-10-CM | POA: Diagnosis present

## 2024-02-21 DIAGNOSIS — I5041 Acute combined systolic (congestive) and diastolic (congestive) heart failure: Secondary | ICD-10-CM | POA: Diagnosis present

## 2024-02-21 DIAGNOSIS — Z5971 Insufficient health insurance coverage: Secondary | ICD-10-CM

## 2024-02-21 DIAGNOSIS — Z823 Family history of stroke: Secondary | ICD-10-CM

## 2024-02-21 DIAGNOSIS — I252 Old myocardial infarction: Secondary | ICD-10-CM

## 2024-02-21 DIAGNOSIS — Z7982 Long term (current) use of aspirin: Secondary | ICD-10-CM

## 2024-02-21 DIAGNOSIS — I255 Ischemic cardiomyopathy: Secondary | ICD-10-CM | POA: Diagnosis present

## 2024-02-21 DIAGNOSIS — I493 Ventricular premature depolarization: Secondary | ICD-10-CM | POA: Diagnosis not present

## 2024-02-22 ENCOUNTER — Inpatient Hospital Stay (HOSPITAL_COMMUNITY)
Admission: EM | Admit: 2024-02-22 | Discharge: 2024-02-24 | DRG: 321 | Disposition: A | Discharge status: Home / Self Care | Payer: Self-pay | Attending: Cardiology | Admitting: Cardiology

## 2024-02-22 ENCOUNTER — Encounter (HOSPITAL_COMMUNITY): Payer: Self-pay | Admitting: *Deleted

## 2024-02-22 ENCOUNTER — Emergency Department (HOSPITAL_COMMUNITY): Payer: Self-pay

## 2024-02-22 ENCOUNTER — Other Ambulatory Visit: Payer: Self-pay

## 2024-02-22 ENCOUNTER — Encounter (HOSPITAL_COMMUNITY)
Admission: EM | Disposition: A | Discharge status: Home / Self Care | Payer: Self-pay | Source: Home / Self Care | Attending: Cardiovascular Disease

## 2024-02-22 DIAGNOSIS — I255 Ischemic cardiomyopathy: Secondary | ICD-10-CM | POA: Insufficient documentation

## 2024-02-22 DIAGNOSIS — I25110 Atherosclerotic heart disease of native coronary artery with unstable angina pectoris: Secondary | ICD-10-CM | POA: Diagnosis present

## 2024-02-22 DIAGNOSIS — Z955 Presence of coronary angioplasty implant and graft: Secondary | ICD-10-CM

## 2024-02-22 DIAGNOSIS — I2102 ST elevation (STEMI) myocardial infarction involving left anterior descending coronary artery: Secondary | ICD-10-CM

## 2024-02-22 DIAGNOSIS — I213 ST elevation (STEMI) myocardial infarction of unspecified site: Principal | ICD-10-CM

## 2024-02-22 DIAGNOSIS — I2510 Atherosclerotic heart disease of native coronary artery without angina pectoris: Secondary | ICD-10-CM

## 2024-02-22 DIAGNOSIS — T82867A Thrombosis of cardiac prosthetic devices, implants and grafts, initial encounter: Secondary | ICD-10-CM | POA: Diagnosis present

## 2024-02-22 DIAGNOSIS — E785 Hyperlipidemia, unspecified: Secondary | ICD-10-CM

## 2024-02-22 DIAGNOSIS — I5041 Acute combined systolic (congestive) and diastolic (congestive) heart failure: Secondary | ICD-10-CM | POA: Clinically undetermined

## 2024-02-22 DIAGNOSIS — R008 Other abnormalities of heart beat: Secondary | ICD-10-CM | POA: Clinically undetermined

## 2024-02-22 DIAGNOSIS — I2109 ST elevation (STEMI) myocardial infarction involving other coronary artery of anterior wall: Secondary | ICD-10-CM | POA: Diagnosis present

## 2024-02-22 DIAGNOSIS — Z720 Tobacco use: Secondary | ICD-10-CM | POA: Diagnosis present

## 2024-02-22 HISTORY — PX: CORONARY ULTRASOUND/IVUS: CATH118244

## 2024-02-22 HISTORY — PX: CORONARY/GRAFT ACUTE MI REVASCULARIZATION: CATH118305

## 2024-02-22 HISTORY — PX: LEFT HEART CATH AND CORONARY ANGIOGRAPHY: CATH118249

## 2024-02-22 LAB — LIPID PANEL
Cholesterol: 230 mg/dL — ABNORMAL HIGH (ref 0–200)
HDL: 41 mg/dL (ref >40)
LDL Cholesterol: 156 mg/dL — ABNORMAL HIGH (ref 0–99)
Total CHOL/HDL Ratio: 5.6 ratio
Triglycerides: 165 mg/dL — ABNORMAL HIGH (ref <150)
VLDL: 33 mg/dL (ref 0–40)

## 2024-02-22 LAB — COMPREHENSIVE METABOLIC PANEL WITH GFR
ALT: 20 U/L (ref 0–44)
AST: 27 U/L (ref 15–41)
Albumin: 3.9 g/dL (ref 3.5–5.0)
Alkaline Phosphatase: 51 U/L (ref 38–126)
Anion gap: 11 (ref 5–15)
BUN: 14 mg/dL (ref 6–20)
CO2: 21 mmol/L — ABNORMAL LOW (ref 22–32)
Calcium: 9.1 mg/dL (ref 8.9–10.3)
Chloride: 104 mmol/L (ref 98–111)
Creatinine, Ser: 1.08 mg/dL (ref 0.61–1.24)
GFR, Estimated: >60 mL/min (ref >60)
Glucose, Bld: 190 mg/dL — ABNORMAL HIGH (ref 70–99)
Potassium: 3.9 mmol/L (ref 3.5–5.1)
Sodium: 136 mmol/L (ref 135–145)
Total Bilirubin: 0.9 mg/dL (ref 0.0–1.2)
Total Protein: 6.2 g/dL — ABNORMAL LOW (ref 6.5–8.1)

## 2024-02-22 LAB — GLUCOSE, CAPILLARY: Glucose-Capillary: 135 mg/dL — ABNORMAL HIGH (ref 70–99)

## 2024-02-22 LAB — CBC WITH DIFFERENTIAL/PLATELET
Abs Immature Granulocytes: 0.04 10*3/uL (ref 0.00–0.07)
Basophils Absolute: 0.1 10*3/uL (ref 0.0–0.1)
Basophils Relative: 1 %
Eosinophils Absolute: 0.1 10*3/uL (ref 0.0–0.5)
Eosinophils Relative: 1 %
HCT: 44.4 % (ref 39.0–52.0)
Hemoglobin: 15.3 g/dL (ref 13.0–17.0)
Immature Granulocytes: 0 %
Lymphocytes Relative: 18 %
Lymphs Abs: 2 10*3/uL (ref 0.7–4.0)
MCH: 32.1 pg (ref 26.0–34.0)
MCHC: 34.5 g/dL (ref 30.0–36.0)
MCV: 93.1 fL (ref 80.0–100.0)
Monocytes Absolute: 0.6 10*3/uL (ref 0.1–1.0)
Monocytes Relative: 6 %
Neutro Abs: 8 10*3/uL — ABNORMAL HIGH (ref 1.7–7.7)
Neutrophils Relative %: 74 %
Platelets: 298 10*3/uL (ref 150–400)
RBC: 4.77 MIL/uL (ref 4.22–5.81)
RDW: 13.4 % (ref 11.5–15.5)
WBC: 10.8 10*3/uL — ABNORMAL HIGH (ref 4.0–10.5)
nRBC: 0 % (ref 0.0–0.2)

## 2024-02-22 LAB — BASIC METABOLIC PANEL WITH GFR
Anion gap: 10 (ref 5–15)
BUN: 14 mg/dL (ref 6–20)
CO2: 19 mmol/L — ABNORMAL LOW (ref 22–32)
Calcium: 8.6 mg/dL — ABNORMAL LOW (ref 8.9–10.3)
Chloride: 104 mmol/L (ref 98–111)
Creatinine, Ser: 0.96 mg/dL (ref 0.61–1.24)
GFR, Estimated: >60 mL/min (ref >60)
Glucose, Bld: 127 mg/dL — ABNORMAL HIGH (ref 70–99)
Potassium: 3.9 mmol/L (ref 3.5–5.1)
Sodium: 133 mmol/L — ABNORMAL LOW (ref 135–145)

## 2024-02-22 LAB — CBC
HCT: 40.8 % (ref 39.0–52.0)
Hemoglobin: 14 g/dL (ref 13.0–17.0)
MCH: 31.9 pg (ref 26.0–34.0)
MCHC: 34.3 g/dL (ref 30.0–36.0)
MCV: 92.9 fL (ref 80.0–100.0)
Platelets: 263 10*3/uL (ref 150–400)
RBC: 4.39 MIL/uL (ref 4.22–5.81)
RDW: 13.4 % (ref 11.5–15.5)
WBC: 12.3 10*3/uL — ABNORMAL HIGH (ref 4.0–10.5)
nRBC: 0 % (ref 0.0–0.2)

## 2024-02-22 LAB — HEMOGLOBIN A1C
Hgb A1c MFr Bld: 5.4 % (ref 4.8–5.6)
Mean Plasma Glucose: 108.28 mg/dL

## 2024-02-22 LAB — APTT: aPTT: 24 s (ref 24–36)

## 2024-02-22 LAB — PROTIME-INR
INR: 1 (ref 0.8–1.2)
Prothrombin Time: 12.9 s (ref 11.4–15.2)

## 2024-02-22 LAB — HIV ANTIBODY (ROUTINE TESTING W REFLEX): HIV Screen 4th Generation wRfx: NONREACTIVE

## 2024-02-22 LAB — POCT ACTIVATED CLOTTING TIME
Activated Clotting Time: 182 s
Activated Clotting Time: 297 s

## 2024-02-22 LAB — MAGNESIUM: Magnesium: 1.8 mg/dL (ref 1.7–2.4)

## 2024-02-22 LAB — TROPONIN I (HIGH SENSITIVITY)
Troponin I (High Sensitivity): 70 ng/L — ABNORMAL HIGH (ref <18)
Troponin I (High Sensitivity): >24000 ng/L (ref <18)

## 2024-02-22 LAB — MRSA NEXT GEN BY PCR, NASAL: MRSA by PCR Next Gen: NOT DETECTED

## 2024-02-22 LAB — CG4 I-STAT (LACTIC ACID): Lactic Acid, Venous: 1.2 mmol/L (ref 0.5–1.9)

## 2024-02-22 SURGERY — CORONARY/GRAFT ACUTE MI REVASCULARIZATION
Anesthesia: LOCAL

## 2024-02-22 MED ORDER — MIDAZOLAM HCL 2 MG/2ML IJ SOLN
INTRAMUSCULAR | Status: AC
  Filled 2024-02-22: qty 2

## 2024-02-22 MED ORDER — PRASUGREL HCL 10 MG PO TABS
ORAL_TABLET | ORAL | Status: AC
  Filled 2024-02-22: qty 6

## 2024-02-22 MED ORDER — HEPARIN SODIUM (PORCINE) 5000 UNIT/ML IJ SOLN
4000.0000 [IU] | Freq: Once | INTRAMUSCULAR | Status: AC
  Administered 2024-02-22: 4000 [IU] via INTRAVENOUS
  Filled 2024-02-22: qty 1

## 2024-02-22 MED ORDER — SODIUM CHLORIDE 0.9% FLUSH
3.0000 mL | Freq: Two times a day (BID) | INTRAVENOUS | Status: DC
  Administered 2024-02-22 – 2024-02-24 (×6): 3 mL via INTRAVENOUS

## 2024-02-22 MED ORDER — NITROGLYCERIN 0.4 MG SL SUBL
SUBLINGUAL_TABLET | SUBLINGUAL | Status: AC
  Administered 2024-02-22: 0.4 mg via SUBLINGUAL
  Filled 2024-02-22: qty 1

## 2024-02-22 MED ORDER — MORPHINE SULFATE (PF) 2 MG/ML IV SOLN: 2.0000 mg | INTRAVENOUS | Status: DC | PRN

## 2024-02-22 MED ORDER — ROSUVASTATIN CALCIUM 20 MG PO TABS
40.0000 mg | ORAL_TABLET | Freq: Every day | ORAL | Status: DC
  Administered 2024-02-22 – 2024-02-24 (×3): 40 mg via ORAL
  Filled 2024-02-22 (×3): qty 2

## 2024-02-22 MED ORDER — NITROGLYCERIN 0.4 MG SL SUBL: 0.4000 mg | SUBLINGUAL_TABLET | SUBLINGUAL | Status: DC | PRN

## 2024-02-22 MED ORDER — SODIUM CHLORIDE 0.9 % IV BOLUS
2000.0000 mL | Freq: Once | INTRAVENOUS | Status: AC
  Administered 2024-02-22: 2000 mL via INTRAVENOUS

## 2024-02-22 MED ORDER — ASPIRIN 81 MG PO CHEW
324.0000 mg | CHEWABLE_TABLET | Freq: Once | ORAL | Status: AC
  Administered 2024-02-22: 324 mg via ORAL
  Filled 2024-02-22: qty 4

## 2024-02-22 MED ORDER — MAGNESIUM SULFATE 2 GM/50ML IV SOLN
2.0000 g | Freq: Once | INTRAVENOUS | Status: AC
Start: 2024-02-22 — End: 2024-02-22
  Administered 2024-02-22: 2 g via INTRAVENOUS
  Filled 2024-02-22: qty 50

## 2024-02-22 MED ORDER — PRASUGREL HCL 10 MG PO TABS
10.0000 mg | ORAL_TABLET | Freq: Every day | ORAL | Status: DC
  Administered 2024-02-23 – 2024-02-24 (×2): 10 mg via ORAL
  Filled 2024-02-22 (×2): qty 1

## 2024-02-22 MED ORDER — TIROFIBAN HCL IN NACL 5-0.9 MG/100ML-% IV SOLN
INTRAVENOUS | Status: AC
  Filled 2024-02-22: qty 100

## 2024-02-22 MED ORDER — FENTANYL CITRATE (PF) 100 MCG/2ML IJ SOLN
INTRAMUSCULAR | Status: DC | PRN
  Administered 2024-02-22: 25 ug via INTRAVENOUS

## 2024-02-22 MED ORDER — OXYCODONE HCL 5 MG PO TABS: 5.0000 mg | ORAL_TABLET | ORAL | Status: DC | PRN

## 2024-02-22 MED ORDER — HEPARIN (PORCINE) IN NACL 1000-0.9 UT/500ML-% IV SOLN
INTRAVENOUS | Status: DC | PRN
  Administered 2024-02-22: 1000 mL

## 2024-02-22 MED ORDER — TIROFIBAN HCL IN NACL 5-0.9 MG/100ML-% IV SOLN
INTRAVENOUS | Status: AC | PRN
  Administered 2024-02-22: .15 ug/kg/min via INTRAVENOUS

## 2024-02-22 MED ORDER — LIDOCAINE HCL (PF) 1 % IJ SOLN
INTRAMUSCULAR | Status: AC
  Filled 2024-02-22: qty 30

## 2024-02-22 MED ORDER — NITROGLYCERIN 0.4 MG SL SUBL: 0.4000 mg | SUBLINGUAL_TABLET | Freq: Once | SUBLINGUAL | Status: AC

## 2024-02-22 MED ORDER — ROSUVASTATIN CALCIUM 20 MG PO TABS: 20.0000 mg | ORAL_TABLET | Freq: Every day | ORAL | Status: DC

## 2024-02-22 MED ORDER — LIDOCAINE HCL (PF) 1 % IJ SOLN
INTRAMUSCULAR | Status: DC | PRN
  Administered 2024-02-22: 2 mL

## 2024-02-22 MED ORDER — HYDRALAZINE HCL 20 MG/ML IJ SOLN: 10.0000 mg | INTRAMUSCULAR | Status: AC | PRN

## 2024-02-22 MED ORDER — TIROFIBAN HCL IN NACL 5-0.9 MG/100ML-% IV SOLN: 0.1500 ug/kg/min | INTRAVENOUS | Status: DC

## 2024-02-22 MED ORDER — FENTANYL CITRATE PF 50 MCG/ML IJ SOSY
PREFILLED_SYRINGE | INTRAMUSCULAR | Status: AC
  Administered 2024-02-22: 50 ug
  Filled 2024-02-22: qty 1

## 2024-02-22 MED ORDER — HEPARIN SODIUM (PORCINE) 1000 UNIT/ML IJ SOLN
INTRAMUSCULAR | Status: AC
  Filled 2024-02-22: qty 10

## 2024-02-22 MED ORDER — ORAL CARE MOUTH RINSE: 15.0000 mL | OROMUCOSAL | Status: DC | PRN

## 2024-02-22 MED ORDER — METOPROLOL TARTRATE 12.5 MG HALF TABLET
12.5000 mg | ORAL_TABLET | Freq: Two times a day (BID) | ORAL | Status: DC
  Administered 2024-02-22 – 2024-02-23 (×4): 12.5 mg via ORAL
  Filled 2024-02-22 (×4): qty 1

## 2024-02-22 MED ORDER — SODIUM CHLORIDE 0.9 % IV SOLN: INTRAVENOUS | Status: AC

## 2024-02-22 MED ORDER — PRASUGREL HCL 10 MG PO TABS
ORAL_TABLET | ORAL | Status: DC | PRN
  Administered 2024-02-22: 60 mg via ORAL

## 2024-02-22 MED ORDER — VERAPAMIL HCL 2.5 MG/ML IV SOLN
INTRAVENOUS | Status: DC | PRN
  Administered 2024-02-22: 10 mL via INTRA_ARTERIAL

## 2024-02-22 MED ORDER — ONDANSETRON HCL 4 MG/2ML IJ SOLN: 4.0000 mg | Freq: Four times a day (QID) | INTRAMUSCULAR | Status: DC | PRN

## 2024-02-22 MED ORDER — SODIUM CHLORIDE 0.9% FLUSH: 3.0000 mL | INTRAVENOUS | Status: DC | PRN

## 2024-02-22 MED ORDER — FENTANYL CITRATE (PF) 100 MCG/2ML IJ SOLN
INTRAMUSCULAR | Status: AC
  Filled 2024-02-22: qty 2

## 2024-02-22 MED ORDER — ASPIRIN 81 MG PO TBEC
81.0000 mg | DELAYED_RELEASE_TABLET | Freq: Every day | ORAL | Status: DC
  Administered 2024-02-23 – 2024-02-24 (×2): 81 mg via ORAL
  Filled 2024-02-22 (×2): qty 1

## 2024-02-22 MED ORDER — LABETALOL HCL 5 MG/ML IV SOLN: 10.0000 mg | INTRAVENOUS | Status: AC | PRN

## 2024-02-22 MED ORDER — NITROGLYCERIN 0.4 MG SL SUBL
0.4000 mg | SUBLINGUAL_TABLET | Freq: Once | SUBLINGUAL | Status: AC
  Administered 2024-02-22: 0.4 mg via SUBLINGUAL

## 2024-02-22 MED ORDER — CHLORHEXIDINE GLUCONATE CLOTH 2 % EX PADS
6.0000 | MEDICATED_PAD | Freq: Every day | CUTANEOUS | Status: DC
  Administered 2024-02-23 – 2024-02-24 (×2): 6 via TOPICAL

## 2024-02-22 MED ORDER — ONDANSETRON HCL 4 MG/2ML IJ SOLN
INTRAMUSCULAR | Status: AC
  Administered 2024-02-22: 4 mg
  Filled 2024-02-22: qty 2

## 2024-02-22 MED ORDER — HEPARIN SODIUM (PORCINE) 1000 UNIT/ML IJ SOLN
INTRAMUSCULAR | Status: DC | PRN
Start: 2024-02-22 — End: 2024-02-22
  Administered 2024-02-22 (×2): 5000 [IU] via INTRAVENOUS

## 2024-02-22 MED ORDER — TIROFIBAN (AGGRASTAT) BOLUS VIA INFUSION
INTRAVENOUS | Status: DC | PRN
  Administered 2024-02-22: 2125 ug via INTRAVENOUS

## 2024-02-22 MED ORDER — ISOSORBIDE MONONITRATE ER 30 MG PO TB24
30.0000 mg | ORAL_TABLET | Freq: Every day | ORAL | Status: DC
  Administered 2024-02-22 – 2024-02-24 (×3): 30 mg via ORAL
  Filled 2024-02-22 (×3): qty 1

## 2024-02-22 MED ORDER — SODIUM CHLORIDE 0.9 % IV SOLN: 250.0000 mL | INTRAVENOUS | Status: AC | PRN

## 2024-02-22 MED ORDER — ACETAMINOPHEN 325 MG PO TABS
650.0000 mg | ORAL_TABLET | ORAL | Status: DC | PRN
  Administered 2024-02-22 – 2024-02-24 (×5): 650 mg via ORAL
  Filled 2024-02-22 (×5): qty 2

## 2024-02-22 MED ORDER — MIDAZOLAM HCL 2 MG/2ML IJ SOLN
INTRAMUSCULAR | Status: DC | PRN
  Administered 2024-02-22: 1 mg via INTRAVENOUS

## 2024-02-22 SURGICAL SUPPLY — 18 items
BALLOON EMERGE MR 2.0X15 (BALLOONS) IMPLANT
BALLOON SAPPHIRE 2.5X12 (BALLOONS) IMPLANT
BALLOON SAPPHIRE NC24 4.0X12 (BALLOONS) IMPLANT
BALLOON ~~LOC~~ EMERGE MR 3.75X20 (BALLOONS) IMPLANT
CATH 5FR JL3.5 JR4 ANG PIG MP (CATHETERS) IMPLANT
CATH LAUNCHER 6FR EBU3.5 (CATHETERS) IMPLANT
CATH OPTICROSS HD (CATHETERS) IMPLANT
DEVICE RAD COMP TR BAND LRG (VASCULAR PRODUCTS) IMPLANT
GLIDESHEATH SLEND SS 6F .021 (SHEATH) IMPLANT
GUIDEWIRE INQWIRE 1.5J.035X260 (WIRE) IMPLANT
KIT ENCORE 26 ADVANTAGE (KITS) IMPLANT
KIT HEMO VALVE WATCHDOG (MISCELLANEOUS) IMPLANT
PACK CARDIAC CATHETERIZATION (CUSTOM PROCEDURE TRAY) ×1 IMPLANT
SET ATX-X65L (MISCELLANEOUS) IMPLANT
SLED PULL BACK IVUS (MISCELLANEOUS) IMPLANT
STENT SYNERGY XD 3.50X32 (Permanent Stent) IMPLANT
TUBING CIL FLEX 10 FLL-RA (TUBING) IMPLANT
WIRE RUNTHROUGH .014X180CM (WIRE) IMPLANT

## 2024-02-22 NOTE — ED Notes (Addendum)
 Cardiology at bedside. Waiting on cath lab at this time.

## 2024-02-22 NOTE — ED Provider Notes (Signed)
 [place] EMERGENCY DEPARTMENT AT [facility] Provider Note   CSN: [id number] Arrival date & time: 02/21/24  2358     History  Chief Complaint  Patient presents with   Chest Pain    Ricky [name] is a 48 y.o. male.  The history is provided by the patient.  Chest Pain Pain location:  Substernal area Pain radiates to:  L arm and R arm Pain severity:  Severe Onset quality:  Sudden Duration:  3 hours Timing:  Constant Progression:  Unchanged Chronicity:  Recurrent Context: at rest   Context comment:  Smoking Relieved by:  Nothing Worsened by:  Nothing Ineffective treatments:  Nitroglycerin  Associated symptoms: diaphoresis, nausea and shortness of breath   Patient with CAD and NSTEMI presents with SSCP with radiation to B upper extremities at rest tonight.  Is not currently on medication and last saw cardiology in 2019.       Home Medications Prior to Admission medications   Medication Sig Start Date End Date Taking? Authorizing Provider  aspirin  EC 81 MG tablet Take 1 tablet (81 mg total) by mouth daily. 11/18/17   Lawrance Presume, MD  atorvastatin  (LIPITOR ) 80 MG tablet TAKE 1 TABLET (80 MG TOTAL) BY MOUTH DAILY AT 6 PM. 12/12/17   Dunn, Dayna N, PA-C  clopidogrel  (PLAVIX ) 75 MG tablet Take 1 tablet (75 mg total) by mouth daily. 11/18/17   Lawrance Presume, MD  clopidogrel  (PLAVIX ) 75 MG tablet TAKE 1 TABLET BY MOUTH DAILY. 08/28/18   Odie Benne, MD  escitalopram  (LEXAPRO ) 10 MG tablet TAKE 1 TABLET BY MOUTH DAILY. 02/17/18   Lawrance Presume, MD  isosorbide  mononitrate (IMDUR ) 30 MG 24 hr tablet TAKE 1 TABLET BY MOUTH AT BEDTIME. 12/12/17   Dunn, Dayna N, PA-C  metoprolol  tartrate (LOPRESSOR ) 25 MG tablet Take 1 tablet (25 mg total) by mouth 2 (two) times daily. MUST MAKE APPT FOR FURTHER REFILLS 07/27/18   Lawrance Presume, MD  nitroGLYCERIN  (NITROSTAT ) 0.4 MG SL tablet Place 1 tablet (0.4 mg total) under the tongue every 5 (five) minutes as  needed for chest pain. 11/12/17   Sanjuanita Cruz, NP      Allergies    Patient has no known allergies.    Review of Systems   Review of Systems  Constitutional:  Positive for diaphoresis.  Respiratory:  Positive for shortness of breath.   Cardiovascular:  Positive for chest pain.  Gastrointestinal:  Positive for nausea.  All other systems reviewed and are negative.   Physical Exam Updated Vital Signs BP (!) 138/90   Pulse 92   Temp (!) 97.5 F (36.4 C) (Temporal)   Resp 19   Ht 5\' 8"  (1.727 m)   Wt 85 kg   SpO2 97%   BMI 28.49 kg/m  Physical Exam Vitals and nursing note reviewed.  Constitutional:      General: He is not in acute distress.    Appearance: Normal appearance. He is well-developed. He is not diaphoretic.  HENT:     Head: Normocephalic and atraumatic.     Nose: Nose normal.  Eyes:     Conjunctiva/sclera: Conjunctivae normal.     Pupils: Pupils are equal, round, and reactive to light.  Cardiovascular:     Rate and Rhythm: Normal rate and regular rhythm.  Pulmonary:     Effort: Pulmonary effort is normal.     Breath sounds: Normal breath sounds. No wheezing or rales.  Abdominal:     General:  Bowel sounds are normal.     Palpations: Abdomen is soft.     Tenderness: There is no abdominal tenderness. There is no guarding or rebound.  Musculoskeletal:        General: Normal range of motion.     Cervical back: Normal range of motion and neck supple.  Skin:    General: Skin is warm and dry.     Capillary Refill: Capillary refill takes less than 2 seconds.  Neurological:     General: No focal deficit present.     Mental Status: He is alert and oriented to person, place, and time.     Deep Tendon Reflexes: Reflexes normal.  Psychiatric:        Mood and Affect: Mood normal.     ED Results / Procedures / Treatments   Labs (all labs ordered are listed, but only abnormal results are displayed) Results for orders placed or performed during the hospital  encounter of 02/22/24  CBC with Differential/Platelet   Collection Time: 02/22/24 12:08 AM  Result Value Ref Range   WBC 10.8 (H) 4.0 - 10.5 K/uL   RBC 4.77 4.22 - 5.81 MIL/uL   Hemoglobin 15.3 13.0 - 17.0 g/dL   HCT 16.1 09.6 - 04.5 %   MCV 93.1 80.0 - 100.0 fL   MCH 32.1 26.0 - 34.0 pg   MCHC 34.5 30.0 - 36.0 g/dL   RDW 40.9 81.1 - 91.4 %   Platelets 298 150 - 400 K/uL   nRBC 0.0 0.0 - 0.2 %   Neutrophils Relative % 74 %   Neutro Abs 8.0 (H) 1.7 - 7.7 K/uL   Lymphocytes Relative 18 %   Lymphs Abs 2.0 0.7 - 4.0 K/uL   Monocytes Relative 6 %   Monocytes Absolute 0.6 0.1 - 1.0 K/uL   Eosinophils Relative 1 %   Eosinophils Absolute 0.1 0.0 - 0.5 K/uL   Basophils Relative 1 %   Basophils Absolute 0.1 0.0 - 0.1 K/uL   Immature Granulocytes 0 %   Abs Immature Granulocytes 0.04 0.00 - 0.07 K/uL  Protime-INR   Collection Time: 02/22/24 12:08 AM  Result Value Ref Range   Prothrombin Time 12.9 11.4 - 15.2 seconds   INR 1.0 0.8 - 1.2  APTT   Collection Time: 02/22/24 12:08 AM  Result Value Ref Range   aPTT 24 24 - 36 seconds  Lipid panel   Collection Time: 02/22/24 12:08 AM  Result Value Ref Range   Cholesterol 230 (H) 0 - 200 mg/dL   Triglycerides 782 (H) <150 mg/dL   HDL 41 >95 mg/dL   Total CHOL/HDL Ratio 5.6 RATIO   VLDL 33 0 - 40 mg/dL   LDL Cholesterol 621 (H) 0 - 99 mg/dL  Hemoglobin H0Q   Collection Time: 02/22/24 12:12 AM  Result Value Ref Range   Hgb A1c MFr Bld 5.4 4.8 - 5.6 %   Mean Plasma Glucose 108.28 mg/dL   DG Chest Port 1 View Result Date: 02/22/2024 CLINICAL DATA:  Chest pain, STEMI EXAM: PORTABLE CHEST 1 VIEW COMPARISON:  11/11/2017 FINDINGS: Lungs are clear.  No pleural effusion or pneumothorax. The heart is normal in size.  Coronary stent. IMPRESSION: No acute cardiopulmonary disease. Electronically Signed   By: Zadie Herter M.D.   On: 02/22/2024 00:40     EKG STEMI, see muse.    Radiology No results found.  Procedures .Critical  Care  Performed by: Tonya Fredrickson, MD Authorized by: Tonya Fredrickson, MD   Critical care  provider statement:    Critical care time (minutes):  30   Critical care end time:  02/22/2024 12:54 AM   Critical care was necessary to treat or prevent imminent or life-threatening deterioration of the following conditions:  Cardiac failure and circulatory failure   Critical care was time spent personally by me on the following activities:  Development of treatment plan with patient or surrogate, discussions with consultants, evaluation of patient's response to treatment, examination of patient, ordering and review of laboratory studies, ordering and review of radiographic studies, ordering and performing treatments and interventions, pulse oximetry, re-evaluation of patient's condition and review of old charts   Care discussed with: admitting provider   Comments:     Spoke with Dr. Arlester Ladd by phone regarding patient      Medications Ordered in ED Medications  0.9 %  sodium chloride  infusion (has no administration in time range)  nitroGLYCERIN  (NITROSTAT ) SL tablet 0.4 mg (has no administration in time range)  aspirin  chewable tablet 324 mg (324 mg Oral Given 02/22/24 0018)  heparin  injection 4,000 Units (4,000 Units Intravenous Given 02/22/24 0018)  fentaNYL  (SUBLIMAZE ) 50 MCG/ML injection (50 mcg  Given 02/22/24 0018)  ondansetron  (ZOFRAN ) 4 MG/2ML injection (4 mg  Given 02/22/24 0018)  nitroGLYCERIN  (NITROSTAT ) 0.4 MG SL tablet (0.4 mg  Given 02/22/24 0022)    ED Course/ Medical Decision Making/ A&P                                 Medical Decision Making Patient with SSCP and SOB and n/d.    Amount and/or Complexity of Data Reviewed Independent Historian:     Details: S.o see above  External Data Reviewed: notes.    Details: Previous notes and cath report reviewed  Labs: ordered.    Details: HBAIC 5.4 white count slight elevation 10.8, normal hemoglobin 15.4, normal platelets.  Other labs  pending at this time Radiology: ordered and independent interpretation performed.    Details: NACPD by me  ECG/medicine tests: ordered and independent interpretation performed. Decision-making details documented in ED Course.  Risk Parenteral controlled substances. Decision regarding hospitalization. Risk Details: Nitroglycerin  held by me due to concern for pre-load dependency. Fentanyl  IV given at low dose as BP neutral.  Heparin  given, ASA given.     Final Clinical Impression(s) / ED Diagnoses Final diagnoses:  ST elevation myocardial infarction (STEMI), unspecified artery (HCC)   The patient appears reasonably stabilized for admission considering the current resources, flow, and capabilities available in the ED at this time, and I doubt any other [facility] requiring further screening and/or treatment in the ED prior to admission.  Rx / DC Orders ED Discharge Orders     None         Gabryella Murfin, MD 02/22/24 6045

## 2024-02-22 NOTE — ED Triage Notes (Signed)
 The pt is c/o chest pain for 2-3 hours  he took 2 old nitroglycerin  that did not help his pain  he has stents in his heart also
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
## 2024-02-22 NOTE — Progress Notes (Signed)
~  0230: Palpable 5cm hematoma present above TR band on R radial access site, pt denies any tenderness or pain. No signs of ischemia. Borders marked & manual pressure applied w/ BP cuff per order. Dr. Donna Fus notified & aggrastat  d/c'ed. VSS.

## 2024-02-22 NOTE — Progress Notes (Signed)
 Patient admitted early this morning with anterior STEMI.  Previous LAD PCI from 2017 with a very late stent thrombosis.  IVUS imaging with evidence of thrombus despite redilation within the old stent and so repeat PCI was performed with good result.  Reports some chest pain this morning, down to a 3 or 4 from 10 on arrival.  Will start low-dose metoprolol  as well as isosorbide , oxycodone  available for breakthrough.  Had been taking aspirin  only, not on statin since discharge.  Resumed Crestor 40 mg daily.  Small hematoma noted proximal to TR band, pressure held for 5 minutes with some improvement.  Aggrastat  off, continue aspirin  and prasugrel.  Echocardiogram pending, should be able to transfer to the floor and potentially discharge tomorrow

## 2024-02-23 ENCOUNTER — Encounter (HOSPITAL_COMMUNITY): Payer: Self-pay | Admitting: Cardiovascular Disease

## 2024-02-23 ENCOUNTER — Inpatient Hospital Stay (HOSPITAL_COMMUNITY): Payer: Self-pay

## 2024-02-23 DIAGNOSIS — I25110 Atherosclerotic heart disease of native coronary artery with unstable angina pectoris: Secondary | ICD-10-CM

## 2024-02-23 DIAGNOSIS — I2489 Other forms of acute ischemic heart disease: Secondary | ICD-10-CM

## 2024-02-23 DIAGNOSIS — R008 Other abnormalities of heart beat: Secondary | ICD-10-CM

## 2024-02-23 DIAGNOSIS — I255 Ischemic cardiomyopathy: Secondary | ICD-10-CM

## 2024-02-23 DIAGNOSIS — I5041 Acute combined systolic (congestive) and diastolic (congestive) heart failure: Secondary | ICD-10-CM

## 2024-02-23 DIAGNOSIS — T82867A Thrombosis of cardiac prosthetic devices, implants and grafts, initial encounter: Principal | ICD-10-CM

## 2024-02-23 DIAGNOSIS — Z720 Tobacco use: Secondary | ICD-10-CM

## 2024-02-23 DIAGNOSIS — E785 Hyperlipidemia, unspecified: Secondary | ICD-10-CM

## 2024-02-23 DIAGNOSIS — I2109 ST elevation (STEMI) myocardial infarction involving other coronary artery of anterior wall: Secondary | ICD-10-CM

## 2024-02-23 DIAGNOSIS — Z955 Presence of coronary angioplasty implant and graft: Secondary | ICD-10-CM

## 2024-02-23 LAB — CBC
HCT: 41.8 % (ref 39.0–52.0)
Hemoglobin: 14.6 g/dL (ref 13.0–17.0)
MCH: 32.7 pg (ref 26.0–34.0)
MCHC: 34.9 g/dL (ref 30.0–36.0)
MCV: 93.5 fL (ref 80.0–100.0)
Platelets: 257 10*3/uL (ref 150–400)
RBC: 4.47 MIL/uL (ref 4.22–5.81)
RDW: 13.6 % (ref 11.5–15.5)
WBC: 9.9 10*3/uL (ref 4.0–10.5)
nRBC: 0 % (ref 0.0–0.2)

## 2024-02-23 LAB — BASIC METABOLIC PANEL WITH GFR
Anion gap: 9 (ref 5–15)
BUN: 9 mg/dL (ref 6–20)
CO2: 23 mmol/L (ref 22–32)
Calcium: 9.1 mg/dL (ref 8.9–10.3)
Chloride: 106 mmol/L (ref 98–111)
Creatinine, Ser: 0.99 mg/dL (ref 0.61–1.24)
GFR, Estimated: >60 mL/min (ref >60)
Glucose, Bld: 100 mg/dL — ABNORMAL HIGH (ref 70–99)
Potassium: 4.3 mmol/L (ref 3.5–5.1)
Sodium: 138 mmol/L (ref 135–145)

## 2024-02-23 LAB — ECHOCARDIOGRAM COMPLETE
AR max vel: 2.62 cm2
AV Area VTI: 2.79 cm2
AV Area mean vel: 2.52 cm2
AV Mean grad: 3 mmHg
AV Peak grad: 5.7 mmHg
Ao pk vel: 1.2 m/s
Area-P 1/2: 4.31 cm2
Height: 68 in
S' Lateral: 3.2 cm
Weight: 2998.26 [oz_av]

## 2024-02-23 LAB — MAGNESIUM: Magnesium: 2 mg/dL (ref 1.7–2.4)

## 2024-02-23 MED ORDER — LOSARTAN POTASSIUM 25 MG PO TABS: 25.0000 mg | ORAL_TABLET | Freq: Every day | ORAL | Status: DC

## 2024-02-23 NOTE — Progress Notes (Signed)
 CARDIAC REHAB PHASE I   PRE:  Rate/Rhythm: 86 bigeminy    BP: sitting 118/65    SpO2: 94 Ra  MODE:  Ambulation: 740 ft   POST:  Rate/Rhythm: 88 SR    BP: sitting 112/92, recheck 120/86     SpO2: 95 RA  Ambulated without c/o. Bigeminy resolved while walking. Present most of the time while sitting in recliner.   Discussed with pt and girlfriend MI, stent, restrictions, Effient importance, vaping cessation, diet, exercise, NTG and CRPII.  Receptive. He is thinking of quitting vaping, currently at lowest amount of nicotine  in his vape. Discussed methods of cessation and resources given. Will refer to [place] CRPII but will need medicaid first.  [phone]  Ricky [name] BS, ACSM-CEP 02/23/2024 9:53 AM

## 2024-02-23 NOTE — Progress Notes (Signed)
*  PRELIMINARY RESULTS* Echocardiogram 2D Echocardiogram has been performed.  Palmer Bobo 02/23/2024, 10:28 AM

## 2024-02-23 NOTE — TOC Initial Note (Addendum)
 Transition of Care [facility]) - Initial/Assessment Note    Patient Details  Name: Ricky [name] MRN: [id number] Date of Birth: 01-12-1976  [facility]) CM/SW Contact:    Benjiman Bras, RN Phone Number: [phone] 02/23/2024, 12:00 PM  Clinical Narrative:                  TOC CM spoke to pt and states he does not have insurance. Will send referral to Financial Counselor. Will assist with medications with [facility].   New PCP appt scheduled for 03/17/2024 at 1020 am.   Expected Discharge Plan: Home/Self Care Barriers to Discharge: Continued Medical Work up   Patient Goals and CMS Choice Patient states their goals for this hospitalization and ongoing recovery are:: wants to recover          Expected Discharge Plan and Services   Discharge Planning Services: CM Consult   Living arrangements for the past 2 months: Single Family Home                                      Prior Living Arrangements/Services Living arrangements for the past 2 months: Single Family Home Lives with:: Significant Other Patient language and need for interpreter reviewed:: Yes Do you feel safe going back to the place where you live?: Yes      Need for Family Participation in Patient Care: No (Comment) Care giver support system in place?: Yes (comment)   Criminal Activity/Legal Involvement Pertinent to Current Situation/Hospitalization: No - Comment as needed  Activities of Daily Living      Permission Sought/Granted Permission sought to share information with : Case Manager, Family Supports, PCP Permission granted to share information with : Yes, Verbal Permission Granted  Share Information with NAME: Neomi Banks     Permission granted to share info w Relationship: SO  Permission granted to share info w Contact Information: [phone]  Emotional Assessment Appearance:: Appears stated age Attitude/Demeanor/Rapport: Engaged Affect (typically observed):  Accepting Orientation: : Oriented to Self, Oriented to Place, Oriented to  Time, Oriented to Situation   Psych Involvement: No (comment)  Admission diagnosis:  STEMI (ST elevation myocardial infarction) (HCC) [I21.3] ST elevation myocardial infarction (STEMI), unspecified artery [facility]) [I21.3] Patient Active Problem List   Diagnosis Date Noted   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 02/23/2024   Presence of drug coated stent in LAD coronary artery 02/23/2024   Ischemic cardiomyopathy: In setting of anterior MI 02/23/2024   Acute combined systolic and diastolic heart failure (HCC) 02/23/2024   Acute ST elevation myocardial infarction (STEMI) of anterolateral wall (HCC) 02/22/2024   Hyperlipidemia with target low density lipoprotein (LDL) cholesterol less than 55 mg/dL 02/72/5366   Unstable angina (HCC) 11/11/2017   Depression 05/14/2016   Tobacco abuse    Coronary artery disease involving native coronary artery of native heart with unstable angina pectoris (HCC)    NSTEMI (non-ST elevated myocardial infarction) (HCC)    PCP:  Lawrance Presume, MD Pharmacy:   [id number] [place], [place] - [address] AT CORNER OF [facility] [address] [place]  [address] Phone: [phone] Fax: [phone]  [id number] [place], [place] - [address] AT [facility] OF [facility] [address] & [facility] [address] [place] [address] Phone: [phone] Fax: [phone]  [facility] [address] [place] [address] Phone: [phone] Fax: [phone]  [facility] [phone] - [place], [place] - [place] [address] AT [facility] OF [address] [facility] [address] & [address] [place] [address] Phone: [phone] Fax: [phone]     Social Drivers of Health (SDOH) Social History: SDOH Screenings    Food Insecurity: No Food Insecurity (02/22/2024)  Housing: Low Risk  (02/22/2024)  Transportation Needs: No Transportation Needs (02/22/2024)  Utilities: Not At Risk (02/22/2024)  Tobacco Use: Medium Risk (02/22/2024)   SDOH Interventions:     Readmission Risk Interventions     No data to display

## 2024-02-23 NOTE — Plan of Care (Signed)

## 2024-02-23 NOTE — Progress Notes (Signed)
 Rounding Note    Patient Name: Ricky [name] Date of Encounter: 02/23/2024   [facility] Cardiologist: Antoinette Batman, MD   Patient Profile     48 y.o. male with history of CAD-LAD stent in 2017 with jailed diagonal branch stenosis (patent in 2019), then lost to follow-up.  Only taking aspirin .  He presented with Anterolateral STEMI on 02/22/2027 taken to the Cath Lab and found to have late stent thrombosis of the LAD requiring PTCA and IVUS guided DES PCI overlapping the previous stent.  Severe reduced EF of 35% with anterior hypokinesis and apical akinesis.  LVEDP 23 mmHg.  Subjective   Feels better today.  No more chest pain.  He does feel occasional flutters but this has been there since prior to the heart attack. Interestingly, he notes that his anginal presentation at this occasion was not nearly as severe just more prolonged and a dull ache. No PND or orthopnea.  Breathing well.   Assessment & Plan    Principal Problem:   Acute ST elevation myocardial infarction (STEMI) of anterolateral wall (HCC) Active Problems:   Coronary artery disease involving native coronary artery of native heart with unstable angina pectoris (HCC)   Hyperlipidemia with target low density lipoprotein (LDL) cholesterol less than 55 mg/dL   Very late coronary stent thrombosis-LAD   Ischemic cardiomyopathy: In setting of anterior MI   Acute combined systolic and diastolic heart failure (HCC)   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   Tobacco abuse   Presence of drug coated stent in LAD coronary artery  Principal Problem:   Acute ST elevation myocardial infarction (STEMI) of anterolateral wall (HCC)   Coronary artery disease involving native coronary artery of native heart with unstable angina pectoris (HCC)   Very late coronary stent thrombosis-LAD;  Presence of drug coated stent in LAD coronary artery Sizable anterior infarct requiring PTCA and then DES PCI of previously  LAD stent.  He had been off all medications except for aspirin . On ASA 81 mg/Effient 10 mg DAPT with plan for minimum 1 year but would maintain Thienopyridine coverage long-term after 12 months based on significant ISR/thrombosis GDMT:  BP limiting titration of medications but started on low-dose Lopressor  12.5 mg twice daily consolidate to Toprol  on discharge High-dose Crestor 40 mL daily    Ischemic cardiomyopathy: In setting of anterior MI   Acute combined systolic and diastolic heart failure (HCC) 2D echo pending, but EF appeared to be roughly 35% with apical akinesis and anterior wall hypokinesis. Diuresed well overnight with LVEDP of 23.  Auto diuresis => closely monitor renal function GDMT limited by low blood pressure.  Barely able to tolerate low-dose beta-blocker started because of PVCs. Hope to start either low-dose ARB versus MRA tomorrow With low A1c, may hold off but consider SGLT2 inhibitor prior to discharge. Depending on EF on echo may consider LifeVest for discharge.    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 Peripheral started on beta-blocker-Lopressor  12.5 mg twice daily-not able to titrate due to low pressure. => Convert to Toprol  on discharge     Hyperlipidemia with target low density lipoprotein (LDL) cholesterol less than 55 mg/dL -> current LDL extremely high at 156, suspect that he will probably need more than just statin to bring his LDL down. Started on Crestor 4 mg daily,  Anticipate outpatient lipid clinic monitoring with hide likelihood of needing PCSK9 meter.(Can potentially start investigating cost of assessment. A1c 5.4       Tobacco abuse = tobacco  cessation counseling.  Consider patch if needed.     Dispo: Hemodynamically stable, anticipate transfer to telemetry today, but not yet ready for early discharge tomorrow.  Need time to titrate medications based on low blood pressure and low EF.  He will need social work consult and discharge planning-he  will need assistance for medications and follow-up.  He will also need to have FMLA paperwork if possible.  He works in [employer] and will need to have several weeks (at least 2 if not 3 weeks) out work.     ============================================================-======  Inpatient Medications    Scheduled Meds:  aspirin  EC  81 mg Oral Daily   Chlorhexidine Gluconate Cloth  6 each Topical Daily   isosorbide  mononitrate  30 mg Oral Daily   metoprolol  tartrate  12.5 mg Oral BID   prasugrel  10 mg Oral Daily   rosuvastatin  40 mg Oral Daily   sodium chloride  flush  3 mL Intravenous Q12H   Continuous Infusions:  PRN Meds: acetaminophen , morphine injection, nitroGLYCERIN , ondansetron  (ZOFRAN ) IV, mouth rinse, oxyCODONE , sodium chloride  flush   Vital Signs    Vitals:   02/23/24 0600 02/23/24 0700 02/23/24 0800 02/23/24 0900  BP: 99/71 114/74 114/75 118/65  Pulse: 76 90 (!) 55 71  Resp: (!) 21 (!) 21 11 (!) 24  Temp:  98.2 F (36.8 C)    TempSrc:  Oral    SpO2: 94% 95% 96% 95%  Weight:      Height:        Intake/Output Summary (Last 24 hours) at 02/23/2024 1030 Last data filed at 02/23/2024 0600 Gross per 24 hour  Intake 139.53 ml  Output 3195 ml  Net -3055.47 ml      02/22/2024    2:45 AM 02/22/2024   12:08 AM 02/27/2018    8:17 AM  Last 3 Weights  Weight (lbs) 187 lb 6.3 oz 187 lb 6.3 oz 187 lb 6.4 oz  Weight (kg) 85 kg 85 kg 85.004 kg      Telemetry    Sinus rhythm with PVCs and bigeminy and couplets/triplets- Personally Reviewed  ECG    Sinus rhythm (76 bpm) with biphasic ST elevation/T wave inversions in V2 through V5 with loss of anterior R waves transitioning to V5 => evolutionary changes of anterior MI- Personally Reviewed  Physical Exam   GEN: No acute distress.  Resting comfortably. Neck: No JVD Cardiac: RRR, no murmurs, rubs, or gallops.  Respiratory: Clear to auscultation bilaterally. GI: Soft, nontender, non-distended  MS: No  edema; No deformity. Neuro:  Nonfocal  Psych: Normal affect   Labs    High Sensitivity Troponin:   Recent Labs  Lab 02/22/24 0008 02/22/24 0253  TROPONINIHS 70* >24,000*     Chemistry Recent Labs  Lab 02/22/24 0008 02/22/24 0253 02/23/24 0331  NA 136 133* 138  K 3.9 3.9 4.3  CL 104 104 106  CO2 21* 19* 23  GLUCOSE 190* 127* 100*  BUN 14 14 9   CREATININE 1.08 0.96 0.99  CALCIUM  9.1 8.6* 9.1  MG  --  1.8 2.0  PROT 6.2*  --   --   ALBUMIN 3.9  --   --   AST 27  --   --   ALT 20  --   --   ALKPHOS 51  --   --   BILITOT 0.9  --   --   GFRNONAA >60 >60 >60  ANIONGAP 11 10 9     Lipids  Recent Labs  Lab 02/22/24 0008  CHOL 230*  TRIG 165*  HDL 41  LDLCALC 156*  CHOLHDL 5.6    Hematology Recent Labs  Lab 02/22/24 0008 02/22/24 0253 02/23/24 0331  WBC 10.8* 12.3* 9.9  RBC 4.77 4.39 4.47  HGB 15.3 14.0 14.6  HCT 44.4 40.8 41.8  MCV 93.1 92.9 93.5  MCH 32.1 31.9 32.7  MCHC 34.5 34.3 34.9  RDW 13.4 13.4 13.6  PLT 298 263 257   Thyroid No results for input(s): "TSH", "FREET4" in the last 168 hours.  BNPNo results for input(s): "BNP", "PROBNP" in the last 168 hours.  DDimer No results for input(s): "DDIMER" in the last 168 hours.   Cardiology/Radiology studies    CARDIAC CATHETERIZATION Result Date: 02/22/2024 1.  Acute anterolateral STEMI secondary to very late stent thrombosis of 3.5 mm LAD stent 2.  Patent left main, left circumflex, and RCA with mild nonobstructive plaquing 3.  Severe segmental LV dysfunction with anteroapical akinesis and areas of dyskinesis, LVEF estimated at 35% 4.  Successful IVUS guided PCI of the LAD using a 3.5 x 32 mm Synergy DES.  IVUS demonstrates reference vessel diameter of 3.5 mm and thrombus in multiple areas of the old stent even after redilatation.  Findings necessitated restenting of the LAD. 5.  Provisional angioplasty of the second diagonal branch in order to keep the branch patent using a 2.0 x 15 mm balloon, 90% stenosis  present both pre and post stenting of the LAD Recommendations: Patient loaded with prasugrel 60 mg in the Cath Lab.  Aggrastat  will be continued for 2 hours.  Consider lifelong DAPT as tolerated following very late stent thrombosis.  CV-ICU care, post MI medical therapy.  LVEDP elevated at 23 mmHg, but will hold off on IV diuresis as his blood pressure is borderline.  Check 2D echocardiogram.  (LV gram: 1-normal, 2-hypokinesis, 4-akinesis)  Diagnostic = Dominance: Right      Intervention    Echocardiogram Pending (02/23/2024)   DG Chest Port 1 View Result Date: 02/22/2024 CLINICAL DATA:  Chest pain, STEMI EXAM: PORTABLE CHEST 1 VIEW COMPARISON:  11/11/2017 FINDINGS: Lungs are clear.  No pleural effusion or pneumothorax. The heart is normal in size.  Coronary stent. IMPRESSION: No acute cardiopulmonary disease. Electronically Signed   By: Zadie Herter M.D.   On: 02/22/2024 00:40      For questions or updates, please contact [facility] Please consult [email or web address] for contact info under        Signed, Randene Bustard, MD  02/23/2024, 10:30 AM

## 2024-02-24 ENCOUNTER — Other Ambulatory Visit (HOSPITAL_COMMUNITY): Payer: Self-pay

## 2024-02-24 ENCOUNTER — Telehealth: Payer: Self-pay | Admitting: Cardiology

## 2024-02-24 LAB — LIPOPROTEIN A (LPA): Lipoprotein (a): 105.2 nmol/L — ABNORMAL HIGH (ref <75.0)

## 2024-02-24 MED ORDER — LOSARTAN POTASSIUM 25 MG PO TABS
12.5000 mg | ORAL_TABLET | Freq: Every day | ORAL | 11 refills | Status: DC
  Filled 2024-02-24: qty 15, 30d supply, fill #0

## 2024-02-24 MED ORDER — ROSUVASTATIN CALCIUM 40 MG PO TABS
40.0000 mg | ORAL_TABLET | Freq: Every day | ORAL | 3 refills | Status: DC
  Filled 2024-02-24: qty 30, 30d supply, fill #0

## 2024-02-24 MED ORDER — LOSARTAN POTASSIUM 25 MG PO TABS
12.5000 mg | ORAL_TABLET | Freq: Every day | ORAL | Status: DC
  Administered 2024-02-24: 12.5 mg via ORAL
  Filled 2024-02-24: qty 1

## 2024-02-24 MED ORDER — PRASUGREL HCL 10 MG PO TABS
10.0000 mg | ORAL_TABLET | Freq: Every day | ORAL | 3 refills | Status: DC
Start: 2024-02-24 — End: 2024-03-11
  Filled 2024-02-24: qty 30, 30d supply, fill #0

## 2024-02-24 MED ORDER — ISOSORBIDE MONONITRATE ER 30 MG PO TB24
30.0000 mg | ORAL_TABLET | Freq: Every day | ORAL | 11 refills | Status: DC
  Filled 2024-02-24: qty 30, 30d supply, fill #0

## 2024-02-24 MED ORDER — NITROGLYCERIN 0.4 MG SL SUBL
0.4000 mg | SUBLINGUAL_TABLET | SUBLINGUAL | 12 refills | Status: DC | PRN
  Filled 2024-02-24: qty 25, 1d supply, fill #0

## 2024-02-24 MED ORDER — METOPROLOL SUCCINATE ER 25 MG PO TB24
25.0000 mg | ORAL_TABLET | Freq: Every day | ORAL | 11 refills | Status: DC
Start: 2024-02-24 — End: 2024-03-11
  Filled 2024-02-24: qty 30, 30d supply, fill #0

## 2024-02-24 MED ORDER — METOPROLOL SUCCINATE ER 25 MG PO TB24
25.0000 mg | ORAL_TABLET | Freq: Every day | ORAL | Status: DC
  Administered 2024-02-24: 25 mg via ORAL
  Filled 2024-02-24: qty 1

## 2024-02-24 NOTE — Progress Notes (Signed)
 Ricky [name] to be D/C'd Home per MD order.  Discussed with the patient and all questions fully answered.  VSS, Skin clean, dry and intact without evidence of skin break down, no evidence of skin tears noted. IV catheter discontinued intact. Site without signs and symptoms of complications. Dressing and pressure applied.  An After Visit Summary was printed and given to the patient. Patient received prescription.  D/c education completed with patient/family including follow up instructions, medication list, d/c activities limitations if indicated, with other d/c instructions as indicated by MD - patient able to verbalize understanding, all questions fully answered.   Patient instructed to return to ED, call 911, or call MD for any changes in condition.   Patient escorted via WC, and D/C home via private auto.  Ricky [name] 02/24/2024 11:07 AM

## 2024-02-24 NOTE — TOC Transition Note (Signed)
 Transition of Care (TOC) - Discharge Note Sherin Dingwall RN, BSN Transitions of Care Unit 4E- RN Case Manager See Treatment Team for direct phone #   Patient Details  Name: Ricky [name] MRN: [id number] Date of Birth: 04/12/1976  [facility] CM/SW Contact:  Rox Cope, RN Phone Number: 02/24/2024, 11:17 AM   Clinical Narrative:    Pt stable for transition home today, meds have been sent to [facility] pharmacy. CM has done [facility] letter to assist with medications.   No further TOC needs noted, has ride home.    Final next level of care: Home/Self Care Barriers to Discharge: Barriers Resolved   Patient Goals and CMS Choice Patient states their goals for this hospitalization and ongoing recovery are:: wants to recover   Choice offered to / list presented to : NA      Discharge Placement               Home        Discharge Plan and Services Additional resources added to the After Visit Summary for     Discharge Planning Services: MATCH Program, Medication Assistance Post Acute Care Choice: NA          DME Arranged: N/A DME Agency: NA       HH Arranged: NA HH Agency: NA        Social Drivers of Health (SDOH) Interventions SDOH Screenings   Food Insecurity: No Food Insecurity (02/22/2024)  Housing: Low Risk  (02/22/2024)  Transportation Needs: No Transportation Needs (02/22/2024)  Utilities: Not At Risk (02/22/2024)  Tobacco Use: Medium Risk (02/22/2024)     Readmission Risk Interventions    02/24/2024   11:17 AM  Readmission Risk Prevention Plan  Post Dischage Appt Complete  Medication Screening Complete  Transportation Screening Complete

## 2024-02-24 NOTE — Telephone Encounter (Signed)
   Transition of Care Follow-up Phone Call Request    Patient Name: Ricky [name] Date of Birth: [birth year] Date of Encounter: 02/24/2024  Primary Care Provider:  No primary care provider on file. Primary Cardiologist:  Antoinette Batman, MD  Ricky [name] has been scheduled for a transition of care follow up appointment with a [facility] provider:  Leala Prince 5/15  Please reach out to Ricky [name] within 48 hours of discharge to confirm appointment and review transition of care protocol questionnaire. Anticipated discharge date: 4/29  Johnie Nailer, NP  02/24/2024, 9:57 AM

## 2024-02-24 NOTE — Discharge Summary (Addendum)
 Discharge Summary    Patient ID: Ricky [name] MRN: [id number]; DOB: [birth year]  Admit date: 02/22/2024 Discharge date: 02/24/2024  PCP:  No primary care provider on file.   [facility] Providers Cardiologist:  Antoinette Batman, MD     Discharge Diagnoses    Principal Problem:   Acute ST elevation myocardial infarction (STEMI) of anterolateral wall (HCC) Active Problems:   Tobacco abuse   Coronary artery disease involving native coronary artery of native heart with unstable angina pectoris (HCC)   Hyperlipidemia with target low density lipoprotein (LDL) cholesterol less than 55 mg/dL   Very late coronary stent thrombosis-LAD   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   Presence of drug coated stent in LAD coronary artery   Ischemic cardiomyopathy: In setting of anterior MI   Acute combined systolic and diastolic heart failure (HCC)  Diagnostic Studies/Procedures    Cath: 02/22/2024  1.  Acute anterolateral STEMI secondary to very late stent thrombosis of 3.5 mm LAD stent 2.  Patent left main, left circumflex, and RCA with mild nonobstructive plaquing 3.  Severe segmental LV dysfunction with anteroapical akinesis and areas of dyskinesis, LVEF estimated at 35% 4.  Successful IVUS guided PCI of the LAD using a 3.5 x 32 mm Synergy DES.  IVUS demonstrates reference vessel diameter of 3.5 mm and thrombus in multiple areas of the old stent even after redilatation.  Findings necessitated restenting of the LAD. 5.  Provisional angioplasty of the second diagonal branch in order to keep the branch patent using a 2.0 x 15 mm balloon, 90% stenosis present both pre and post stenting of the LAD   Recommendations: Patient loaded with prasugrel 60 mg in the Cath Lab.  Aggrastat  will be continued for 2 hours.  Consider lifelong DAPT as tolerated following very late stent thrombosis.  CV-ICU care, post MI medical therapy.  LVEDP elevated at 23 mmHg, but will hold off on IV  diuresis as his blood pressure is borderline.  Check 2D echocardiogram.  Diagnostic = Dominance: Right                                                                    Intervention                Echo: 02/23/2024 (personally reviewed): LVEF by report estimated 50 to 55%.  On my assessment more consistent with roughly 45%). mid inferoseptal segment, apical septal segment, apical anterior segment, apical inferior segment, and apex are hypokinetic. The mid inferoseptal segment, apical septal segment, apical anterior segment, apical inferior segment, and apex are hypokinetic.Ricky [name]  Zio 1 DD but wall strain.  Normal RV.  Normal MV and AoV.  Mildly elevated RAP at 8 mmHg. _____________   History of Present Illness     Ricky [name] is a 48 y.o. male with PMH of CAD s/p LAD stent '17 other than tobacco use who presented with chest pain and STEMI that started a few hours prior to presenting to the ED. Tried 2 stale nitros at home without much effect.  No SOB, some dizziness.  Some diaphoresis.   H/o LAD stent in 2017, relook cath in 2019 noted jailed diagonal w/ some ostial haziness.  That was medically managed.  He had been  lost to cardiology followup since 2019, and was only taking a baby aspirin  a day.  Unfortunately still vaping, also uses weed occasionally.     In the ED, anterolateral ST elevations noted w/ reciprocal inferior changes.  Chest pain free after 2 nitroglycerins but also dropped systolics to 60s, requiring IVF bolus with quick BP recovery. Received ASA and heparin  bolus in the ED.   Denies diabetes, unfortunately he does not have health insurance and would need to pay for meds out of pocket. No recently bleeding.   Taken emergently for cardiac cath.   Hospital Course    Acute ST elevation myocardial infarction Coronary artery disease involving native coronary artery of native heart with unstable angina pectoris with prior LAD stent '17 Very late coronary stent thrombosis-LAD --  underwent cardiac cath noted above with sizable anterior infarct requiring PTCA and then DES PCI of previously LAD stent.  He reported he had been off all medications except for aspirin . hsTn peaked at >24000 -- recommendations for DAPT with ASA 81 mg/Effient 10 mg DAPT with plan for minimum 1 year but would maintain Thienopyridine coverage long-term after 12 months based on significant ISR/thrombosis -- GDMT: BP limiting titration of medications but started on low-dose Lopressor  12.5 mg twice daily consolidated to Toprol  25 on discharge and losartan 12.5mg  daily.  -- continue High-dose Crestor 40 mL daily   Ischemic cardiomyopathy: In setting of anterior MI with recovery Acute combined systolic and diastolic heart failure -- LVEDP of 23, but diuresed well, net - 4.1L -- echo 4/28 reported LVEF of 50-55%(on my review at least 45% noting dramatic improvement from LV gram), g1DD, hypokinesis of mid inferoseptal, apical septal/anterior/inferior segment, normal RV, no significant valvular disease  -- GDMT limited by low blood pressure, but tolerated low dose Toprol  25mg  daily and losartan 12.5mg  daily. Will need follow up echo in 3 months   Ventricular bigeminy  -- resolved with addition of Toprol  25mg  daily    Hyperlipidemia  -- LDL 156, HDL 41. LP (a) 105 -- continue Crestor 40 mg daily,  -- plan for lipid clinic referral at discharge  -- will need FLP/LFTs in 8 weeks  Tobacco abuse  -- cessation advised    General: Well developed, well nourished, male appearing in no acute distress. Head: Normocephalic, atraumatic.  Neck: Supple without bruits, JVD. Lungs:  Resp regular and unlabored, CTA. Heart: RRR, S1, S2, no S3, S4, or murmur; no rub. Abdomen: Soft, non-tender, non-distended with normoactive bowel sounds.  Extremities: No clubbing, cyanosis, edema. Distal pedal pulses are 2+ bilaterally. Right radial cath site stable without bruising or hematoma Neuro: Alert and oriented X 3. Moves  all extremities spontaneously. Psych: Normal affect.  Patient seen by Dr. Addie Holstein and deemed stable for discharge home. Follow up arranged in the office. Medications sent to the [facility] pharmacy at discharge. Educated by pharmD prior to discharge.    Did the patient have an acute coronary syndrome (MI, NSTEMI, STEMI, etc) this admission?:  Yes                               AHA/ACC ACS Clinical Performance & Quality Measures: Aspirin  prescribed? - Yes ADP Receptor Inhibitor (Plavix /Clopidogrel , Brilinta /Ticagrelor  or Effient/Prasugrel) prescribed (includes medically managed patients)? - Yes Beta Blocker prescribed? - Yes High Intensity Statin (Lipitor  40-80mg  or Crestor 20-40mg ) prescribed? - Yes EF assessed during THIS hospitalization? - Yes For EF <40%, was ACEI/ARB prescribed? - Not Applicable (EF >/=  40%) For EF <40%, Aldosterone Antagonist (Spironolactone or Eplerenone) prescribed? - Not Applicable (EF >/= 40%) Cardiac Rehab Phase II ordered (including medically managed patients)? - Yes    The patient will be scheduled for a TOC follow up appointment in 10-14 days.  A message has been sent to the [facility] and Scheduling Pool at the office where the patient should be seen for follow up.  _____________  Discharge Vitals Blood pressure 114/80, pulse 90, temperature 98.1 F (36.7 C), temperature source Oral, resp. rate 17, height 5\' 8"  (1.727 m), weight 85 kg, SpO2 91%.  Filed Weights   02/22/24 0008 02/22/24 0245  Weight: 85 kg 85 kg    Labs & Radiologic Studies    CBC Recent Labs    02/22/24 0008 02/22/24 0253 02/23/24 0331  WBC 10.8* 12.3* 9.9  NEUTROABS 8.0*  --   --   HGB 15.3 14.0 14.6  HCT 44.4 40.8 41.8  MCV 93.1 92.9 93.5  PLT 298 263 257   Basic Metabolic Panel Recent Labs    40/98/11 0253 02/23/24 0331  NA 133* 138  K 3.9 4.3  CL 104 106  CO2 19* 23  GLUCOSE 127* 100*  BUN 14 9  CREATININE 0.96 0.99  CALCIUM  8.6* 9.1  MG 1.8 2.0   Liver Function  Tests Recent Labs    02/22/24 0008  AST 27  ALT 20  ALKPHOS 51  BILITOT 0.9  PROT 6.2*  ALBUMIN 3.9   No results for input(s): "LIPASE", "AMYLASE" in the last 72 hours. High Sensitivity Troponin:   Recent Labs  Lab 02/22/24 0008 02/22/24 0253  TROPONINIHS 70* >24,000*    BNP Invalid input(s): "POCBNP" D-Dimer No results for input(s): "DDIMER" in the last 72 hours. Hemoglobin A1C Recent Labs    02/22/24 0012  HGBA1C 5.4   Fasting Lipid Panel Recent Labs    02/22/24 0008  CHOL 230*  HDL 41  LDLCALC 156*  TRIG 165*  CHOLHDL 5.6   Thyroid Function Tests No results for input(s): "TSH", "T4TOTAL", "T3FREE", "THYROIDAB" in the last 72 hours.  Invalid input(s): "FREET3" _____________  DG Chest Port 1 View Result Date: 02/22/2024 CLINICAL DATA:  Chest pain, STEMI EXAM: PORTABLE CHEST 1 VIEW COMPARISON:  11/11/2017 FINDINGS: Lungs are clear.  No pleural effusion or pneumothorax. The heart is normal in size.  Coronary stent. IMPRESSION: No acute cardiopulmonary disease. Electronically Signed   By: Zadie Herter M.D.   On: 02/22/2024 00:40   Disposition   Pt is being discharged home today in good condition.  Follow-up Plans & Appointments     Follow-up Information     Melodie Spry, NP Follow up.   Specialty: Family Medicine Why: New Primary Care Physician, hospital follow up on 03/17/2024 at 1020 am. please cal to reschedule if you are unable to keep appointment Contact information: [address] [place] [address] [phone]                Discharge Instructions     AMB Referral to [facility] Pharm-D   Complete by: As directed    Reason For Referral: Lipids   Amb Referral to Cardiac Rehabilitation   Complete by: As directed    Diagnosis:  Coronary Stents STEMI PTCA     After initial evaluation and assessments completed: Virtual Based Care may be provided alone or in conjunction with Phase 2 Cardiac Rehab based on patient  barriers.: Yes   Intensive Cardiac Rehabilitation (ICR) [facility] location only OR Traditional  Cardiac Rehabilitation (TCR) *If criteria for ICR are not met will enroll in TCR [facility] only): Yes   Call MD for:  difficulty breathing, headache or visual disturbances   Complete by: As directed    Call MD for:  persistant dizziness or light-headedness   Complete by: As directed    Call MD for:  redness, tenderness, or signs of infection (pain, swelling, redness, odor or green/yellow discharge around incision site)   Complete by: As directed    Diet - low sodium heart healthy   Complete by: As directed    Discharge instructions   Complete by: As directed    Radial Site Care Refer to this sheet in the next few weeks. These instructions provide you with information on caring for yourself after your procedure. Your caregiver may also give you more specific instructions. Your treatment has been planned according to current medical practices, but problems sometimes occur. Call your caregiver if you have any problems or questions after your procedure. HOME CARE INSTRUCTIONS You may shower the day after the procedure. Remove the bandage (dressing) and gently wash the site with plain soap and water. Gently pat the site dry.  Do not apply powder or lotion to the site.  Do not submerge the affected site in water for 3 to 5 days.  Inspect the site at least twice daily.  Do not flex or bend the affected arm for 24 hours.  No lifting over 5 pounds (2.3 kg) for 5 days after your procedure.  Do not drive home if you are discharged the same day of the procedure. Have someone else drive you.  You may drive 24 hours after the procedure unless otherwise instructed by your caregiver.  What to expect: Any bruising will usually fade within 1 to 2 weeks.  Blood that collects in the tissue (hematoma) may be painful to the touch. It should usually decrease in size and tenderness within 1 to 2 weeks.  SEEK IMMEDIATE MEDICAL CARE  IF: You have unusual pain at the radial site.  You have redness, warmth, swelling, or pain at the radial site.  You have drainage (other than a small amount of blood on the dressing).  You have chills.  You have a fever or persistent symptoms for more than 72 hours.  You have a fever and your symptoms suddenly get worse.  Your arm becomes pale, cool, tingly, or numb.  You have heavy bleeding from the site. Hold pressure on the site.   PLEASE DO NOT MISS ANY DOSES OF YOUR EFFIENT!!!!! Also keep a log of you blood pressures and bring back to your follow up appt. Please call the office with any questions.   Patients taking blood thinners should generally stay away from medicines like ibuprofen , Advil , Motrin , naproxen, and Aleve due to risk of stomach bleeding. You may take Tylenol  as directed or talk to your primary doctor about alternatives.  PLEASE ENSURE THAT YOU DO NOT RUN OUT OF YOUR EFFIENT. This medication is very important to remain on for at least one year. IF you have issues obtaining this medication due to cost please CALL the office 3-5 business days prior to running out in order to prevent missing doses of this medication.   Increase activity slowly   Complete by: As directed         Discharge Medications   Allergies as of 02/24/2024   No Known Allergies      Medication List     STOP taking these  medications    [employer] 500-32.5 MG Tabs Generic drug: Aspirin -Caffeine       TAKE these medications    aspirin  EC 81 MG tablet Take 1 tablet (81 mg total) by mouth daily.   fexofenadine 180 MG tablet Commonly known as: ALLEGRA Take 180 mg by mouth daily.   isosorbide  mononitrate 30 MG 24 hr tablet Commonly known as: IMDUR  Take 1 tablet (30 mg total) by mouth daily.   losartan 25 MG tablet Commonly known as: COZAAR Take 0.5 tablets (12.5 mg total) by mouth daily.   metoprolol  succinate 25 MG 24 hr tablet Commonly known as: TOPROL -XL Take 1 tablet  (25 mg total) by mouth daily.   nitroGLYCERIN  0.4 MG SL tablet Commonly known as: NITROSTAT  Place 1 tablet (0.4 mg total) under the tongue every 5 (five) minutes x 3 doses as needed for chest pain.   prasugrel 10 MG Tabs tablet Commonly known as: EFFIENT Take 1 tablet (10 mg total) by mouth daily.   rosuvastatin 40 MG tablet Commonly known as: CRESTOR Take 1 tablet (40 mg total) by mouth daily.           Outstanding Labs/Studies   FLP/LFTs in 8 weeks  Duration of Discharge Encounter: APP Time: 27 minutes   Signed, Johnie Nailer, NP 02/24/2024, 10:23 AM  ATTENDING ATTESTATION  I have seen, examined and evaluated the patient this morning on rounds along with Johnie Nailer, NP.  After reviewing all the available data and chart, we discussed the patients laboratory, study & physical findings as well as symptoms in detail.  We also discussed general summary of the patient's hospitalization along with pertinent findings, impressions and recommendations.  I agree with the findings, examination, impression recommendations and hospital summary as noted above. Physical exam performed by myself as reviewed above.  Attending adjustments noted in italics.  Sizable anterior infarction with initially significant reduced EF of roughly 25% with dramatic improvement noted on follow-up echocardiogram.  Very reassuring.  He is doing well with no active symptoms.  Up walking around.  With improved EF, suspect that he should have a reasonable favorable outcome.  Our titration of medications is limited by his low blood pressures.  I suspect that as time goes by we should go titrate up his medication in the outpatient setting.  He works a very vigorous job and will probably need to take at least 3 weeks off to recover from his MI and this was discussed with him.  I spent 35 minutes minutes in the care of Ricky [name] today including reviewing labs (3 minutes), reviewing studies (10  minutes-reviewed And Discussed results with patient and family, personally reviewed echocardiogram images and discussed with patient and family family), face to face time discussing treatment options (15 minutes), 7 minutes dictating, and documenting in the encounter.  Stable for discharge.  Plan close follow-up and titration of medications. Recommend patient to [facility] referral although he will likely not complete the course.   Arleen Lacer, MD, MS Randene Bustard, MD., M.S. Interventional Cardiologist  [facility]  Pager # [phone] Phone # [phone] [address] [place], [place] [address]

## 2024-02-24 NOTE — Telephone Encounter (Signed)
 Patients is returning call. Please advise

## 2024-02-24 NOTE — Progress Notes (Signed)
 CARDIAC REHAB PHASE I   PRE:  Rate/Rhythm: 81 NSR  BP:  Sitting: 119/90      SpO2: 95 RA  MODE:  Ambulation: 940 ft    POST:  Rate/Rhythm: 94 NSR  BP:  Sitting: 123/93      SpO2: 95 RA  Pt amb with supervision assistance, pt walked well w/o unusual symptoms. Denies questions about HF symptoms and CR education.   Barkley Li  MS, ACSM-CEP 9:57 AM 02/24/2024    Service time is from 0947 to 0957.

## 2024-02-24 NOTE — Telephone Encounter (Signed)
 Left voicemail to return call to office

## 2024-02-24 NOTE — Telephone Encounter (Signed)
 Patient contacted regarding discharge from [facility] on 02/24/24  Patient understands to follow up on 5/15 at 10:05 with Leala Prince, PA-C Patient understands discharge instructions? yes Patient understands medications and regiment? yes Patient understands to bring all medications to this visit? Yes (reports that he does not want to bring all of his medication bottles, but he has an updated list that he will bring)  No questions or concerns at this time.

## 2024-02-24 NOTE — TOC CM/SW Note (Signed)
  MATCH Medication Assistance Card  Name: Ricky [name]  ID (MRN): [id number] [facility]: [id number] RX Group: [id number] Person Code: [id number] Relationship Code: 1-Cardholder  Discharge Date: 02/24/2024  Expiration Date: 03/03/2024  (must be filled within 7 days of discharge)

## 2024-02-26 ENCOUNTER — Telehealth: Payer: Self-pay | Admitting: Cardiovascular Disease

## 2024-02-26 NOTE — Telephone Encounter (Signed)
 Odie Benne, MD to Me    02/26/24  2:54 PM I am not sure why he has a headache but it is likely not related to his heart. He can take ibuprofen  for several days as needed for his headache. Anastacia Bally patient back with Dr. Abel Hoe advisement. Patient verbalized understanding.

## 2024-02-26 NOTE — Telephone Encounter (Signed)
 Per pt he had cath 02/23/24. He came home on the 29th and has had a really bad headache at the base of his skull. Please advise

## 2024-02-26 NOTE — Telephone Encounter (Signed)
 Called patient back about his message. Patient stated he started having a headache at the base of his skull since coming home after his heart cath. Informed patient that this might be coming from his Imdur  medication, and it should start to clear up after a week. Patient stated he tried to take Tylenol  for the pain, but it does not seem to be helping. Patient was wondering if he should take something else. Will send message to Dr. Abel Hoe for further advisement.

## 2024-02-27 ENCOUNTER — Telehealth (HOSPITAL_COMMUNITY): Payer: Self-pay

## 2024-02-27 NOTE — Telephone Encounter (Signed)
 Attempted to call patient in regards to Cardiac Rehab - LM on VM

## 2024-03-01 ENCOUNTER — Telehealth: Payer: Self-pay | Admitting: Cardiovascular Disease

## 2024-03-01 DIAGNOSIS — Z79899 Other long term (current) drug therapy: Secondary | ICD-10-CM

## 2024-03-01 MED ORDER — FUROSEMIDE 20 MG PO TABS: 20.0000 mg | ORAL_TABLET | Freq: Every day | ORAL | 3 refills | Status: DC

## 2024-03-01 NOTE — Telephone Encounter (Signed)
 Spoke with DOD (Croitoru) and he recommends 20 mg of lasix daily. Also increasing foods high in potassium.   Spoke with patient and he is aware to start lasix 20 mg daily. He will keep appointment next week.

## 2024-03-01 NOTE — Telephone Encounter (Signed)
 Left a message for the pt to call back... post hosp with APP 03/11/24.

## 2024-03-01 NOTE — Telephone Encounter (Signed)
 Spoke with patient and he states he gained 3 lbs on Friday, 3 lbs on Saturday, 1 lb on Sunday. Sunday he was 187. This morning hew is 190. He woke up and his legs and feet were swollen. Denies any chest pain, SOB, headache, nausea or vomiting. Will speak with DOD

## 2024-03-01 NOTE — Telephone Encounter (Signed)
 Pt c/o swelling/edema: STAT if pt has developed SOB within 24 hours  If swelling, where is the swelling located?   Feet  How much weight have you gained and in what time span?  Patient stated he has gained 3 pounds in each of the last 3 days  Have you gained 2 pounds in a day or 5 pounds in a week?   Yes  Do you have a log of your daily weights (if so, list)?   Yes  Are you currently taking a fluid pill?   No  Are you currently SOB?   No  Have you traveled recently in a car or plane for an extended period of time?   No  Patient reports he gained 3 pounds in each of the last 3 days.  Patient noted his headaches has decreased.  Patient wants advice on next steps.

## 2024-03-01 NOTE — Telephone Encounter (Signed)
 Pt is returning call.

## 2024-03-10 NOTE — Progress Notes (Unsigned)
 Cardiology Office Note:   Date:  03/11/2024  ID:  Ricky [name], DOB 12-03-1975, MRN [id number] PCP: No primary care provider on file.   HeartCare Providers Cardiologist:  Antoinette Batman, MD    History of Present Illness:   Discussed the use of AI scribe software for clinical note transcription with the patient, who gave verbal consent to proceed.  History of Present Illness Ricky [name] is a 48 year old male with coronary artery disease who presents for follow-up after recent hospitalization for chest pain.  He has a history of coronary artery disease with a previous LAD stent placed in 2017. In 2019, a relook left heart catheterization showed a jail diagonal vessel with ostial haziness, managed medically. He was lost to cardiology follow-up until he presented to the hospital on February 22, 2024, with chest pain and was admitted with anterolateral ST elevation. He underwent PTCA and drug-eluting stent PCI of the previous LAD stent.  Following his intervention, was started on ASA and Effient . LV gram during cath reflected significant systolic dysfunction but on 04/28 TTE, LVEF dramatically improved. Patient also started on Toprol  XL 25mg  and Losartan  12.5mg . Patient also restarted on high intensity statin, Crestor  40mg .   Post-hospitalization, he feels better overall but experiences chest tightness and soreness with physical activity, resolving with rest. The pain is described as a 'dull ache', rated 2 out of 10 on some days, with the worst being a 4 or 5 last week. He has not taken nitroglycerin  for these episodes. Symptoms are slowly improving, allowing for more physical activity before discomfort.  He experiences occasional shortness of breath and dizziness with physical activity, but not at rest or with positional changes.  He reports daily headaches, described as a dull ache at the back of his head and neck, similar to looking down at a phone all day. These headaches  sometimes respond to Tylenol .  He is currently on aspirin , Effient , metoprolol  succinate 25 mg, losartan  12.5 mg, Crestor  40 mg, Imdur  30mg  daily. He started Lasix  20mg  10 days ago after calling in and reporting experiencing swelling in his feet post-hospital discharge, which has since resolved. He monitors his weight and blood pressure daily, noting stable weights since starting Lasix . Also notes good blood pressure readings.  He works in Teacher, early years/pre, which involves physical activity, worries about going back to work.  Studies Reviewed:    EKG:   EKG Interpretation Date/Time:  Thursday Mar 11 2024 10:04:06 EDT Ventricular Rate:  75 PR Interval:  148 QRS Duration:  94 QT Interval:  404 QTC Calculation: 451 R Axis:   112  Text Interpretation: Normal sinus rhythm Anterolateral infarct (cited on or before 22-Feb-2024) When compared with ECG of 23-Feb-2024 06:42, QRS axis Shifted right Serial changes of Anterior infarct Present Confirmed by Ricky [name] [phone]) on 03/11/2024 10:08:41 AM    02/22/24 LHC  1.  Acute anterolateral STEMI secondary to very late stent thrombosis of 3.5 mm LAD stent 2.  Patent left main, left circumflex, and RCA with mild nonobstructive plaquing 3.  Severe segmental LV dysfunction with anteroapical akinesis and areas of dyskinesis, LVEF estimated at 35% 4.  Successful IVUS guided PCI of the LAD using a 3.5 x 32 mm Synergy DES.  IVUS demonstrates reference vessel diameter of 3.5 mm and thrombus in multiple areas of the old stent even after redilatation.  Findings necessitated restenting of the LAD. 5.  Provisional angioplasty of the second diagonal branch in order to keep the branch  patent using a 2.0 x 15 mm balloon, 90% stenosis present both pre and post stenting of the LAD   Recommendations: Patient loaded with prasugrel  60 mg in the Cath Lab.  Aggrastat  will be continued for 2 hours.  Consider lifelong DAPT as tolerated following very late stent  thrombosis.  CV-ICU care, post MI medical therapy.  LVEDP elevated at 23 mmHg, but will hold off on IV diuresis as his blood pressure is borderline.  Check 2D echocardiogram.  Diagnostic Dominance: Right  Intervention   02/23/24 TTE  IMPRESSIONS     1. Left ventricular ejection fraction, by estimation, is 50 to 55%. The  left ventricle has low normal function. The left ventricle demonstrates  regional wall motion abnormalities (see scoring diagram/findings for  description). Left ventricular diastolic   parameters are consistent with Grade I diastolic dysfunction (impaired  relaxation). The mid inferoseptal segment, apical septal segment, apical  anterior segment, apical inferior segment, and apex are hypokinetic. . The  average left ventricular global  longitudinal strain is -13.4 %. The global longitudinal strain is  abnormal.   2. Right ventricular systolic function is normal. The right ventricular  size is normal.   3. The mitral valve is normal in structure. No evidence of mitral valve  regurgitation. No evidence of mitral stenosis.   4. The aortic valve is tricuspid. Aortic valve regurgitation is not  visualized. No aortic stenosis is present.   5. The inferior vena cava is normal in size with <50% respiratory  variability, suggesting right atrial pressure of 8 mmHg.   Comparison(s): No prior Echocardiogram.   FINDINGS   Left Ventricle: Left ventricular ejection fraction, by estimation, is 50  to 55%. The left ventricle has low normal function. The left ventricle  demonstrates regional wall motion abnormalities. The average left  ventricular global longitudinal strain is  -13.4 %. Strain was performed and the global longitudinal strain is  abnormal. The left ventricular internal cavity size was normal in size.  There is no left ventricular hypertrophy. Left ventricular diastolic  parameters are consistent with Grade I  diastolic dysfunction (impaired relaxation).      LV Wall Scoring:  The mid inferoseptal segment, apical septal segment, apical anterior  segment,  apical inferior segment, and apex are hypokinetic. The mid inferoseptal  segment, apical septal segment, apical anterior segment, apical inferior  segment, and apex are hypokinetic.   Right Ventricle: The right ventricular size is normal. No increase in  right ventricular wall thickness. Right ventricular systolic function is  normal.   Left Atrium: Left atrial size was normal in size.   Right Atrium: Right atrial size was normal in size.   Pericardium: There is no evidence of pericardial effusion.   Mitral Valve: The mitral valve is normal in structure. No evidence of  mitral valve regurgitation. No evidence of mitral valve stenosis.   Tricuspid Valve: The tricuspid valve is normal in structure. Tricuspid  valve regurgitation is trivial. No evidence of tricuspid stenosis.   Aortic Valve: The aortic valve is tricuspid. Aortic valve regurgitation is  not visualized. No aortic stenosis is present. Aortic valve mean gradient  measures 3.0 mmHg. Aortic valve peak gradient measures 5.7 mmHg. Aortic  valve area, by VTI measures 2.79  cm.   Pulmonic Valve: The pulmonic valve was normal in structure. Pulmonic valve  regurgitation is not visualized. No evidence of pulmonic stenosis.   Aorta: The aortic root and ascending aorta are structurally normal, with  no evidence of dilitation.  Venous: The inferior vena cava is normal in size with less than 50%  respiratory variability, suggesting right atrial pressure of 8 mmHg.   IAS/Shunts: No atrial level shunt detected by color flow Doppler.   Risk Assessment/Calculations:              Physical Exam:   VS:  BP 109/69   Pulse 75   Ht 5\' 8"  (1.727 m)   Wt 192 lb 8 oz (87.3 kg)   SpO2 96%   BMI 29.27 kg/m    Wt Readings from Last 3 Encounters:  03/11/24 192 lb 8 oz (87.3 kg)  02/22/24 187 lb 6.3 oz (85 kg)  02/27/18 187 lb 6.4  oz (85 kg)     Physical Exam Vitals reviewed.  Constitutional:      Appearance: Normal appearance.  HENT:     Head: Normocephalic.  Eyes:     Pupils: Pupils are equal, round, and reactive to light.  Cardiovascular:     Rate and Rhythm: Normal rate and regular rhythm.     Pulses: Normal pulses.     Heart sounds: Normal heart sounds.  Pulmonary:     Effort: Pulmonary effort is normal.     Breath sounds: Normal breath sounds.  Abdominal:     General: Abdomen is flat.     Palpations: Abdomen is soft.  Musculoskeletal:     Right lower leg: No edema.     Left lower leg: No edema.  Skin:    General: Skin is warm and dry.     Capillary Refill: Capillary refill takes less than 2 seconds.  Neurological:     General: No focal deficit present.     Mental Status: He is alert and oriented to person, place, and time.  Psychiatric:        Mood and Affect: Mood normal.        Behavior: Behavior normal.        Thought Content: Thought content normal.        Judgment: Judgment normal.       ASSESSMENT AND PLAN:    Assessment & Plan Coronary artery disease with recent myocardial infarction Recent myocardial infarction with anterolateral ST elevation requiring PTCA and drug-eluting stent PCI of previous LAD stent. EKG improved from admission, ST elevation has resolved. Anterolateral leads now with TWI. Currently on aspirin , Effient , metoprolol  succinate, losartan , Imdur , and high-intensity statin Crestor . Intermittent chest pain with physical activity, not severe enough to require nitroglycerin . Pain described as a dull ache, improving over time. Headaches suspected to be related to Imdur , limiting dose increase. Metoprolol  dose limited by blood pressure. Ranexa considered as an adjunct to Imdur  for better chest pain management. - Continue aspirin  and Effient  daily. Anticipate life long DAPT. - Continue metoprolol  succinate 25 mg daily. - Continue losartan  12.5 mg daily. - Continue Crestor   40 mg daily. - Add Ranexa 500 mg twice daily. - Continue Imdur  30 mg daily for chest pain management. - Advise to use nitroglycerin  if chest pain does not resolve quickly with rest. - Monitor response to Ranexa and report any lack of improvement in chest pain. As chest pain is predictable with exertion, would not classify as unstable, okay for rehab.  HFrecEF Significant systolic function noted during cath, with improved LVEF on subsequent echo, LVEF 50-55% with mid inferoseptal segment, apical septal segment, apical anterior segment, apical inferior segment, and apex hypokinetic. Patient with LE edema following his admission, was started on Lasix  20mg  daily with resolution of  swelling. - Continue Toprol  XL 25mg  daily - Continue Losartan  12.5mg  daily - Continue Lasix  20mg . Check BMET today - Unable to add further GDMT at this time with patient lacking insurance. Appears that patient assistance might be an option for him which would allow further medication optimization. - Repeat cardiac ultrasound in three months to reassess cardiac function.  Hyperlipidemia Patient not on statin at time of recent MI. LDL 156, HDL 41. LP (a) 105. He was started on Crestor  40mg  during his admission. - Continue Crestor  40mg , plan for repeat LFTs/Lipid panel in 2 months.     Cardiac Rehabilitation Eligibility Assessment  The patient is ready to start cardiac rehabilitation from a cardiac standpoint.        Signed, Ricky Prince, PA-C

## 2024-03-11 ENCOUNTER — Other Ambulatory Visit (HOSPITAL_COMMUNITY): Payer: Self-pay

## 2024-03-11 ENCOUNTER — Ambulatory Visit: Payer: Self-pay | Attending: Cardiology | Admitting: Cardiology

## 2024-03-11 ENCOUNTER — Encounter: Payer: Self-pay | Admitting: Cardiology

## 2024-03-11 VITALS — BP 109/69 | HR 75 | Ht 68.0 in | Wt 192.5 lb

## 2024-03-11 DIAGNOSIS — Z79899 Other long term (current) drug therapy: Secondary | ICD-10-CM

## 2024-03-11 DIAGNOSIS — I214 Non-ST elevation (NSTEMI) myocardial infarction: Secondary | ICD-10-CM

## 2024-03-11 DIAGNOSIS — R072 Precordial pain: Secondary | ICD-10-CM

## 2024-03-11 DIAGNOSIS — I255 Ischemic cardiomyopathy: Secondary | ICD-10-CM

## 2024-03-11 DIAGNOSIS — I25110 Atherosclerotic heart disease of native coronary artery with unstable angina pectoris: Secondary | ICD-10-CM

## 2024-03-11 DIAGNOSIS — E785 Hyperlipidemia, unspecified: Secondary | ICD-10-CM

## 2024-03-11 MED ORDER — PRASUGREL HCL 10 MG PO TABS
10.0000 mg | ORAL_TABLET | Freq: Every day | ORAL | 3 refills | Status: DC
  Filled 2024-03-11: qty 30, 30d supply, fill #0
  Filled 2024-03-11: qty 90, 90d supply, fill #0
  Filled 2024-04-15: qty 30, 30d supply, fill #1
  Filled 2024-05-16 – 2024-05-17 (×2): qty 30, 30d supply, fill #2

## 2024-03-11 MED ORDER — FUROSEMIDE 20 MG PO TABS
20.0000 mg | ORAL_TABLET | Freq: Every day | ORAL | 3 refills | Status: DC
Start: 2024-03-11 — End: 2024-06-14
  Filled 2024-03-11: qty 30, 30d supply, fill #0
  Filled 2024-04-15: qty 30, 30d supply, fill #1

## 2024-03-11 MED ORDER — LOSARTAN POTASSIUM 25 MG PO TABS
12.5000 mg | ORAL_TABLET | Freq: Every day | ORAL | 11 refills | Status: DC
  Filled 2024-03-11 (×2): qty 15, 30d supply, fill #0
  Filled 2024-04-15: qty 15, 30d supply, fill #1
  Filled 2024-05-16 – 2024-05-17 (×2): qty 15, 30d supply, fill #2

## 2024-03-11 MED ORDER — METOPROLOL SUCCINATE ER 25 MG PO TB24
25.0000 mg | ORAL_TABLET | Freq: Every day | ORAL | 11 refills | Status: DC
Start: 2024-03-11 — End: 2024-06-14
  Filled 2024-03-11 (×2): qty 30, 30d supply, fill #0
  Filled 2024-04-15: qty 30, 30d supply, fill #1
  Filled 2024-05-16 – 2024-05-17 (×2): qty 30, 30d supply, fill #2

## 2024-03-11 MED ORDER — ISOSORBIDE MONONITRATE ER 30 MG PO TB24
30.0000 mg | ORAL_TABLET | Freq: Every day | ORAL | 11 refills | Status: DC
  Filled 2024-03-11 (×2): qty 30, 30d supply, fill #0
  Filled 2024-04-15: qty 30, 30d supply, fill #1
  Filled 2024-05-16 – 2024-05-17 (×2): qty 30, 30d supply, fill #2

## 2024-03-11 MED ORDER — NITROGLYCERIN 0.4 MG SL SUBL
0.4000 mg | SUBLINGUAL_TABLET | SUBLINGUAL | 12 refills | Status: DC | PRN
  Filled 2024-03-11: qty 25, 1d supply, fill #0
  Filled 2024-03-11: qty 25, 15d supply, fill #0
  Filled 2024-05-31 (×2): qty 25, 15d supply, fill #1

## 2024-03-11 MED ORDER — RANOLAZINE ER 500 MG PO TB12
500.0000 mg | ORAL_TABLET | Freq: Two times a day (BID) | ORAL | 2 refills | Status: DC
Start: 2024-03-11 — End: 2024-04-19

## 2024-03-11 MED ORDER — ROSUVASTATIN CALCIUM 40 MG PO TABS
40.0000 mg | ORAL_TABLET | Freq: Every day | ORAL | 3 refills | Status: DC
  Filled 2024-03-11: qty 90, 90d supply, fill #0
  Filled 2024-03-11: qty 30, 30d supply, fill #0
  Filled 2024-04-15: qty 30, 30d supply, fill #1
  Filled 2024-05-16 – 2024-05-19 (×2): qty 30, 30d supply, fill #2

## 2024-03-11 NOTE — Patient Instructions (Addendum)
 Medication Instructions:   START TAKING RANEXA 500 MG BY MOUTH TWICE DAILY--THIS PRESCRIPTION WAS CALLED INTO [facility] ON [address] IN [place]--PLEASE ASK THEM TO APPLY GOOD RX CODE TO THIS MEDICATION  ALL YOUR OTHER CARDIAC MEDICATION WILL BE REFILLED DOWNSTAIRS AT OUR [facility] ON THE FIRST FLOOR--PLEASE GO THERE TO PICK THIS UP  *If you need a refill on your cardiac medications before your next appointment, please call your pharmacy*  Lab Work:  1.)  TODAY--BMET--DOWNSTAIRS FIRST FLOOR  2.)  3 MONTHS DOWNSTAIRS FIRST FLOOR AT [facility]--LIPIDS AND LFTs--PLEASE COME FASTING TO THIS LAB APPOINTMENT  If you have labs (blood work) drawn today and your tests are completely normal, you will receive your results only by: MyChart Message (if you have MyChart) OR A paper copy in the mail If you have any lab test that is abnormal or we need to change your treatment, we will call you to review the results.    Testing/Procedures:  Your physician has requested that you have an echocardiogram. Echocardiography is a painless test that uses sound waves to create images of your heart. It provides your doctor with information about the size and shape of your heart and how well your heart's chambers and valves are working. This procedure takes approximately one hour. There are no restrictions for this procedure.  SCHEDULE ECHO TO BE DONE IN 3 MONTHS AS WELL AS LABS TO BE DONE AT THIS APPOINTMENT PER EVAN WILLIAMS PA-C  Please do NOT wear cologne, perfume, aftershave, or lotions (deodorant is allowed). Please arrive 15 minutes prior to your appointment time.  Please note: We ask at that you not bring children with you during ultrasound (echo/ vascular) testing. Due to room size and safety concerns, children are not allowed in the ultrasound rooms during exams. Our front office staff cannot provide observation of children in our lobby area while testing is being conducted. An adult  accompanying a patient to their appointment will only be allowed in the ultrasound room at the discretion of the ultrasound technician under special circumstances. We apologize for any inconvenience.   Follow-Up:  3 MONTH FOLLOW-UP WITH AN EXTENDER IN THE OFFICE---NEEDS LABS/ECHO DONE A FEW DAYS BEFORE THIS APPOINTMENT   Other Instructions  OUR SOCIAL WORKER ISABEL HAS BEEN TRYING TO MAKE CONTACT WITH YOU TO ASSIST YOU WITH [facility] [employer]--WE WILL OBTAIN A GOOD WORKING NUMBER FROM YOU AND GIVE THIS TO HER TO CALL YOU BACK TO ASSIST WITH THIS

## 2024-03-11 NOTE — Progress Notes (Signed)
 H&V Care Navigation CSW Progress Note  Clinical Social Worker completed chart review as pt is self pay to provide further assistance. LCSW currently at [facility] clinic, requested that LPN update phone number in chart as he has been contacted by financial counselors regarding [employer] as over income for Medicaid per recent hospital admission. Will f/u with pt next week to provide further assistance. At this time most cost effective pharmacy option would be [facility], Ranexa may be most affordable with Good Rx card if he opts to use non [facility] pharmacy. At this time advised Ivy, LPN of such and she has updated pt chart.  Patient is participating in a Managed Medicaid Plan:  No, self pay only  SDOH Screenings   Food Insecurity: No Food Insecurity (02/22/2024)  Housing: Low Risk  (02/22/2024)  Transportation Needs: No Transportation Needs (02/22/2024)  Utilities: Not At Risk (02/22/2024)  Tobacco Use: Medium Risk (03/11/2024)    Ricky [name], MSW, LCSW Clinical Social Worker II [facility] Heart/Vascular Care Navigation  [phone]- work cell phone (preferred)

## 2024-03-12 ENCOUNTER — Ambulatory Visit: Payer: Self-pay | Admitting: Cardiology

## 2024-03-12 DIAGNOSIS — I25110 Atherosclerotic heart disease of native coronary artery with unstable angina pectoris: Secondary | ICD-10-CM

## 2024-03-12 DIAGNOSIS — I77810 Thoracic aortic ectasia: Secondary | ICD-10-CM

## 2024-03-12 LAB — BASIC METABOLIC PANEL WITH GFR
BUN/Creatinine Ratio: 13 (ref 9–20)
BUN: 11 mg/dL (ref 6–24)
CO2: 20 mmol/L (ref 20–29)
Calcium: 10.2 mg/dL (ref 8.7–10.2)
Chloride: 101 mmol/L (ref 96–106)
Creatinine, Ser: 0.84 mg/dL (ref 0.76–1.27)
Glucose: 85 mg/dL (ref 70–99)
Potassium: 4.4 mmol/L (ref 3.5–5.2)
Sodium: 139 mmol/L (ref 134–144)
eGFR: 110 mL/min/{1.73_m2} (ref >59)

## 2024-03-15 ENCOUNTER — Telehealth: Payer: Self-pay | Admitting: Licensed Clinical Social Worker

## 2024-03-15 NOTE — Progress Notes (Signed)
 Heart and Vascular Care Navigation  03/15/2024  Ricky [name] 05-Apr-1976 [id number]  Reason for Referral: Self Pay, no PCP Patient is participating in a Managed Medicaid Plan: No, self pay only  Engaged with patient by telephone for initial visit for Heart and Vascular Care Coordination.                                                                                                   Assessment:                                    LCSW was able to reach pt at [phone]. Introduced self, role, reason for call. Confirmed home address, no current PCP and employed full time. Resides with his significant other and children, he was screened over income at hospital for Medicaid. Shares he didn't realize financial counselors were calling. Denies any issues with affordability for food, utilities or housing costs at this time.   We discussed programs such as CAFA and [employer] that can help with costs of medications and medical bills. Pt agreeable to these being sent to his home address and assisting being provided by our team as needed.   No additional questions for me today but noted that he had some swelling and numbness on his leg and asked if I could pass that on- have sent concerns on to triage.    HRT/VAS Care Coordination     Patients Home Cardiology Office --  [facility]   Outpatient Care Team Social Worker   Social Worker Name: Nathen Balder, [place], [phone]   Living arrangements for the past 2 months Single Family Home   Lives with: Significant Other; Minor Children   Patient Current Insurance Coverage Self-Pay   Patient Has Concern With Paying Medical Bills Yes   Patient Concerns With Medical Bills not Medicaid eligible, employed, self pay   Medical Bill Referrals: CAFA   Does Patient Have Prescription Coverage? No   Patient Prescription Assistance Programs [place] [facility]   Home Assistive Devices/Equipment None   DME Agency NA   [facility] NA       Social History:                                                                              SDOH Screenings   Food Insecurity: No Food Insecurity (03/15/2024)  Housing: Low Risk  (03/15/2024)  Transportation Needs: No Transportation Needs (03/15/2024)  Utilities: Not At Risk (03/15/2024)  Financial Resource Strain: Low Risk  (03/15/2024)  Tobacco Use: Medium Risk (03/11/2024)  Health Literacy: Adequate Health Literacy (03/15/2024)    SDOH Interventions: Financial Resources:  Surveyor, quantity Strain Interventions: Artist, Other (Comment) (referral to [employer] and [employer]) Editor, commissioning for [employer]  Program  Food Insecurity:  Food Insecurity Interventions: Intervention Not Indicated  Housing Insecurity:  Housing Interventions: Intervention Not Indicated  Transportation:   Transportation Interventions: Intervention Not Indicated    Other Care Navigation Interventions:     Provided Pharmacy assistance resources [place] [facility]- will mail appplication and goodrx card for non covered meds   Follow-up plan:   LCSW has sent pt the following: my card, CAFA, GoodRx and [employer] application. I have routed his concerns relating to swelling/numbness to nursing care team. Will make sure he has information received and completed for further assistance. Encouraged him to call me as needed

## 2024-03-15 NOTE — Telephone Encounter (Signed)
 Patient identification verified by 2 forms.   Called and spoke to patient  Patient states:  -The numbness/tingling has been there -Upper outer part of his thigh -Pinched nerve in outer part of right hip  Patient denies:  -Swelling             Interventions/Plan: -Pt reassured the thigh numbness of most likely related to pinched nerve in hip.   Patient agrees with plan, no questions at this time

## 2024-03-16 NOTE — Telephone Encounter (Signed)
 Leala Prince, PA-C  You; [place], [place], RN16 minutes ago (9:24 AM)    Patient had radial access for catheterization, no cardiac reason for these symptoms. Would encourage him to follow up with a primary care provider. If acute symptoms, should go to an urgent care or the ED.  Leala Prince, PA-C    Went over the above recommendations per Leala Prince PA-C.   Pt states he has an office visit with his PCP tomorrow, and will run this by them at tomorrows appt.  Pt verbalized understanding and agrees with this plan.  Pt was more than gracious for all the assistance provided.

## 2024-03-17 ENCOUNTER — Other Ambulatory Visit: Payer: Self-pay

## 2024-03-17 ENCOUNTER — Telehealth: Payer: Self-pay | Admitting: Licensed Clinical Social Worker

## 2024-03-17 ENCOUNTER — Ambulatory Visit (INDEPENDENT_AMBULATORY_CARE_PROVIDER_SITE_OTHER): Payer: Self-pay | Admitting: Family Medicine

## 2024-03-17 ENCOUNTER — Encounter: Payer: Self-pay | Admitting: Family Medicine

## 2024-03-17 VITALS — BP 100/70 | HR 77 | Temp 98.2°F | Ht 68.0 in | Wt 190.0 lb

## 2024-03-17 DIAGNOSIS — I5043 Acute on chronic combined systolic (congestive) and diastolic (congestive) heart failure: Secondary | ICD-10-CM

## 2024-03-17 DIAGNOSIS — J302 Other seasonal allergic rhinitis: Secondary | ICD-10-CM

## 2024-03-17 DIAGNOSIS — I5041 Acute combined systolic (congestive) and diastolic (congestive) heart failure: Secondary | ICD-10-CM

## 2024-03-17 DIAGNOSIS — E785 Hyperlipidemia, unspecified: Secondary | ICD-10-CM

## 2024-03-17 DIAGNOSIS — M79604 Pain in right leg: Secondary | ICD-10-CM

## 2024-03-17 DIAGNOSIS — Z7689 Persons encountering health services in other specified circumstances: Secondary | ICD-10-CM

## 2024-03-17 DIAGNOSIS — M79651 Pain in right thigh: Secondary | ICD-10-CM

## 2024-03-17 DIAGNOSIS — F331 Major depressive disorder, recurrent, moderate: Secondary | ICD-10-CM

## 2024-03-17 DIAGNOSIS — I25110 Atherosclerotic heart disease of native coronary artery with unstable angina pectoris: Secondary | ICD-10-CM

## 2024-03-17 MED ORDER — DICLOFENAC SODIUM 1 % EX GEL
2.0000 g | Freq: Two times a day (BID) | CUTANEOUS | 1 refills | Status: DC | PRN
Start: 2024-03-17 — End: 2024-06-14
  Filled 2024-03-17: qty 100, 30d supply, fill #0

## 2024-03-17 MED ORDER — SERTRALINE HCL 50 MG PO TABS
50.0000 mg | ORAL_TABLET | Freq: Every day | ORAL | 5 refills | Status: DC
  Filled 2024-03-17: qty 30, 30d supply, fill #0
  Filled 2024-04-15: qty 30, 30d supply, fill #1

## 2024-03-17 MED ORDER — TRIAMCINOLONE ACETONIDE 55 MCG/ACT NA AERO
2.0000 | INHALATION_SPRAY | Freq: Every day | NASAL | 0 refills | Status: DC
  Filled 2024-03-17: qty 10, 5d supply, fill #0
  Filled 2024-03-30: qty 16.9, 8d supply, fill #0

## 2024-03-17 NOTE — Telephone Encounter (Signed)
 H&V Care Navigation CSW Progress Note  Clinical Social Worker contacted patient by phone to f/u on financial counselor calls. Was able to reach him today at [phone]. I have received confirmation that Saprese, financial counselor [facility], needs to complete screening for hospital bill from April. LCSW provided Saprese's work phone via text as he was waiting at doctor for his new patient PCP appt. No additional questions at this time.  Patient is participating in a Managed Medicaid Plan:  No, self pay only  SDOH Screenings   Food Insecurity: No Food Insecurity (03/16/2024)  Housing: Low Risk  (03/16/2024)  Transportation Needs: No Transportation Needs (03/16/2024)  Utilities: Not At Risk (03/15/2024)  Alcohol Screen: Low Risk  (03/16/2024)  Depression (PHQ2-9): Medium Risk (03/17/2024)  Financial Resource Strain: Low Risk  (03/16/2024)  Physical Activity: Sufficiently Active (03/16/2024)  Social Connections: Socially Isolated (03/16/2024)  Stress: Stress Concern Present (03/16/2024)  Tobacco Use: Medium Risk (03/17/2024)  Health Literacy: Adequate Health Literacy (03/15/2024)   Nathen Balder, MSW, LCSW Clinical Social Worker II [facility] Heart/Vascular Care Navigation  [phone]- work cell phone (preferred)
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
## 2024-03-25 ENCOUNTER — Telehealth: Payer: Self-pay | Admitting: Licensed Clinical Social Worker

## 2024-03-25 NOTE — Telephone Encounter (Signed)
 H&V Care Navigation CSW Progress Note  Clinical Social Worker contacted patient by phone to f/u on resources provided for medication and medical bill assistance. Left voicemail for pt that was later returned [phone]). He confirms he received assistance items but did not have any time to work on them since returning to work this week- will review this weekend and let me know if any questions arise.  Patient is participating in a Managed Medicaid Plan:  No, self pay only  SDOH Screenings   Food Insecurity: No Food Insecurity (03/16/2024)  Housing: Low Risk  (03/16/2024)  Transportation Needs: No Transportation Needs (03/16/2024)  Utilities: Not At Risk (03/15/2024)  Alcohol Screen: Low Risk  (03/16/2024)  Depression (PHQ2-9): Medium Risk (03/17/2024)  Financial Resource Strain: Low Risk  (03/16/2024)  Physical Activity: Sufficiently Active (03/16/2024)  Social Connections: Socially Isolated (03/16/2024)  Stress: Stress Concern Present (03/16/2024)  Tobacco Use: Medium Risk (03/17/2024)  Health Literacy: Adequate Health Literacy (03/15/2024)    Nathen Balder, MSW, LCSW Clinical Social Worker II [facility] Heart/Vascular Care Navigation  [phone]- work cell phone (preferred)

## 2024-03-29 DIAGNOSIS — M79651 Pain in right thigh: Secondary | ICD-10-CM | POA: Insufficient documentation

## 2024-03-29 DIAGNOSIS — J302 Other seasonal allergic rhinitis: Secondary | ICD-10-CM | POA: Insufficient documentation

## 2024-03-29 DIAGNOSIS — I5043 Acute on chronic combined systolic (congestive) and diastolic (congestive) heart failure: Secondary | ICD-10-CM | POA: Insufficient documentation

## 2024-03-29 NOTE — Assessment & Plan Note (Signed)
 Stable, no swelling, or shortness of breathe noted. Continue treatment plan

## 2024-03-29 NOTE — Assessment & Plan Note (Signed)
 Chronic. Stable , continue crestor  40 mg every day.

## 2024-03-29 NOTE — Assessment & Plan Note (Addendum)
 Denies any chest pain or palpitations, on ranexa  500 mg Q 12 hrs and Imdur  30 mg  every day, nitroglygerin 0.4 mg SL as needed.

## 2024-03-31 ENCOUNTER — Other Ambulatory Visit: Payer: Self-pay

## 2024-04-06 ENCOUNTER — Telehealth: Payer: Self-pay | Admitting: Licensed Clinical Social Worker

## 2024-04-06 NOTE — Telephone Encounter (Signed)
 H&V Care Navigation CSW Progress Note  Clinical Social Worker contacted patient by phone to f/u on assistance applications. Pt previously confirmed they were received and he would review to complete them. No answer today at [phone]. Will re-attempt one more time as able.  Patient is participating in a Managed Medicaid Plan:  No, self pay only  SDOH Screenings   Food Insecurity: No Food Insecurity (03/16/2024)  Housing: Low Risk  (03/16/2024)  Transportation Needs: No Transportation Needs (03/16/2024)  Utilities: Not At Risk (03/15/2024)  Alcohol Screen: Low Risk  (03/16/2024)  Depression (PHQ2-9): Medium Risk (03/17/2024)  Financial Resource Strain: Low Risk  (03/16/2024)  Physical Activity: Sufficiently Active (03/16/2024)  Social Connections: Socially Isolated (03/16/2024)  Stress: Stress Concern Present (03/16/2024)  Tobacco Use: Medium Risk (03/17/2024)  Health Literacy: Adequate Health Literacy (03/15/2024)    Nathen Balder, MSW, LCSW Clinical Social Worker II [facility] Heart/Vascular Care Navigation  [phone]- work cell phone (preferred)

## 2024-04-12 ENCOUNTER — Telehealth: Payer: Self-pay | Admitting: Licensed Clinical Social Worker

## 2024-04-12 NOTE — Telephone Encounter (Signed)
 H&V Care Navigation CSW Progress Note  Clinical Social Worker contacted patient by phone to f/u on assistance applications. Pt previously confirmed they were received and he would review to complete them. No answer again today at [phone]. Remain available as needed for additional assistance.  Patient is participating in a Managed Medicaid Plan:  No, self pay only  SDOH Screenings   Food Insecurity: No Food Insecurity (03/16/2024)  Housing: Low Risk  (03/16/2024)  Transportation Needs: No Transportation Needs (03/16/2024)  Utilities: Not At Risk (03/15/2024)  Alcohol Screen: Low Risk  (03/16/2024)  Depression (PHQ2-9): Medium Risk (03/17/2024)  Financial Resource Strain: Low Risk  (03/16/2024)  Physical Activity: Sufficiently Active (03/16/2024)  Social Connections: Socially Isolated (03/16/2024)  Stress: Stress Concern Present (03/16/2024)  Tobacco Use: Medium Risk (03/17/2024)  Health Literacy: Adequate Health Literacy (03/15/2024)    Ricky [name], MSW, LCSW Clinical Social Worker II [facility] Heart/Vascular Care Navigation  [phone]- work cell phone (preferred)

## 2024-04-15 ENCOUNTER — Other Ambulatory Visit (HOSPITAL_COMMUNITY): Payer: Self-pay
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
## 2024-04-19 ENCOUNTER — Telehealth: Payer: Self-pay | Admitting: Pharmacist

## 2024-04-19 ENCOUNTER — Encounter: Payer: Self-pay | Admitting: Pharmacist

## 2024-04-19 ENCOUNTER — Ambulatory Visit: Payer: Self-pay | Attending: Internal Medicine | Admitting: Pharmacist

## 2024-04-19 ENCOUNTER — Other Ambulatory Visit (HOSPITAL_COMMUNITY): Payer: Self-pay

## 2024-04-19 DIAGNOSIS — E785 Hyperlipidemia, unspecified: Secondary | ICD-10-CM

## 2024-04-19 MED ORDER — RANOLAZINE ER 500 MG PO TB12
500.0000 mg | ORAL_TABLET | Freq: Two times a day (BID) | ORAL | 6 refills | Status: DC
  Filled 2024-04-19: qty 60, 30d supply, fill #0
  Filled 2024-05-16 – 2024-05-17 (×2): qty 60, 30d supply, fill #1

## 2024-04-19 NOTE — Patient Instructions (Addendum)
 Your Results:             Your most recent labs Goal  Total Cholesterol 230  < 200  Triglycerides 165 < 150  HDL (happy/good cholesterol) 41 > 40  LDL (lousy/bad cholesterol 156 < 55   Medication changes: Get updated lab today ( lipid and liver function test) continue taking Repatha.   Repatha is a cholesterol medication that improved your body's ability to get rid of bad cholesterol known as LDL. It can lower your LDL up to 60%! It is an injection that is given under the skin every 2 weeks. The medication often requires a prior authorization from your insurance company. We will take care of submitting all the necessary information to your insurance company to get it approved. The most common side effects of Repatha include runny nose, symptoms of the common cold, rarely flu or flu-like symptoms, back/muscle pain in about 3-4% of the patients, and redness, pain, or bruising at the injection site.   Lab orders: We want to repeat labs after 2-3 months.  We will send you a lab order to remind you once we get closer to that time.    Robbi Blanch, Pharm.D [place] [facility]. [facility] [address], [place], [place] [address] Phone: [phone]; Fax: [phone]

## 2024-04-19 NOTE — Assessment & Plan Note (Signed)
 Assessment:  LDL goal: < 55 mg/dl last LDLc 843 mg/dl while off of statin  Crestor  restarted April post MI - 2nd episode - tolerates it well  Follows heart healthy diet  Discussed next potential options (ezetimibe, PCSK-9 inhibitors, bempedoic acid and inclisiran); cost, dosing efficacy, side effects   Plan: Continue taking current medications ( Crestor  40 gm daily)  Will get follow up lab today LFT and lipid lab  Will apply for PA for PCSK9i agent depends on the updated LDL level

## 2024-04-19 NOTE — Telephone Encounter (Signed)
 Pt is returning your call

## 2024-04-20 LAB — HEPATIC FUNCTION PANEL
ALT: 20 IU/L (ref 0–44)
AST: 19 IU/L (ref 0–40)
Albumin: 4.8 g/dL (ref 4.1–5.1)
Alkaline Phosphatase: 75 IU/L (ref 44–121)
Bilirubin Total: 0.3 mg/dL (ref 0.0–1.2)
Bilirubin, Direct: 0.12 mg/dL (ref 0.00–0.40)
Total Protein: 7 g/dL (ref 6.0–8.5)

## 2024-04-21 ENCOUNTER — Other Ambulatory Visit (HOSPITAL_COMMUNITY): Payer: Self-pay

## 2024-04-21 ENCOUNTER — Telehealth: Payer: Self-pay

## 2024-04-21 NOTE — Telephone Encounter (Signed)
 [facility] IN [place], REVIEWING

## 2024-04-21 NOTE — Telephone Encounter (Signed)
 Completed application paperwork. PAP: Application for REPATHA has been submitted to [employer], via fax. If patient requests an update in the meantime, please refer them to [employer] at [phone]

## 2024-04-23 ENCOUNTER — Telehealth: Payer: Self-pay | Admitting: Licensed Clinical Social Worker

## 2024-04-23 NOTE — Telephone Encounter (Signed)
 H&V Care Navigation CSW Progress Note  Clinical Social Worker contacted patient by phone to f/u on assistance applications. Reached him today at [phone]. He is working on them, shares that printing documents has been a barrier for him. Pt inquired if I could get his tax return from [employer] who had assisted with his medications. I shared I'd reach out to her. Provided pt my email should he need to securely send me any additional documents to print off.  Patient is participating in a Managed Medicaid Plan:  No, self pay only  SDOH Screenings   Food Insecurity: No Food Insecurity (03/16/2024)  Housing: Low Risk  (03/16/2024)  Transportation Needs: No Transportation Needs (03/16/2024)  Utilities: Not At Risk (03/15/2024)  Alcohol Screen: Low Risk  (03/16/2024)  Depression (PHQ2-9): Medium Risk (03/17/2024)  Financial Resource Strain: Low Risk  (03/16/2024)  Physical Activity: Sufficiently Active (03/16/2024)  Social Connections: Socially Isolated (03/16/2024)  Stress: Stress Concern Present (03/16/2024)  Tobacco Use: Medium Risk (04/19/2024)  Health Literacy: Adequate Health Literacy (03/15/2024)     Marit Lark, MSW, LCSW Clinical Social Worker II [facility] Health Heart/Vascular Care Navigation  [phone]- work cell phone (preferred)

## 2024-04-23 NOTE — Telephone Encounter (Signed)
 Consent bubble missing on page 2 above patient signature. Refaxed form to company.

## 2024-04-26 LAB — LIPID PANEL

## 2024-04-28 NOTE — Telephone Encounter (Signed)
 There was an error in the initial lipid lab collection, so the test has been reordered. The patient will return tomorrow morning for a fasting lipid panel to obtain a baseline LDL level prior to starting Repatha. The patient has received approval from the [employer] Patient Assistance Program for Repatha.

## 2024-04-28 NOTE — Telephone Encounter (Signed)
 PAP: Patient assistance application for REPATHA has been approved by PAP Companies: [employer] from 04/27/24 to 04/26/25. Medication should be delivered to PAP Delivery: Home. For further shipping updates, please contact [employer] [phone]. Patient ID is: [id number]  LETTER HAS BEEN SENT TO PT

## 2024-04-28 NOTE — Addendum Note (Signed)
 Addended by: Mettie Roylance K on: 04/28/2024 12:37 PM   Modules accepted: Orders

## 2024-04-29 ENCOUNTER — Telehealth: Payer: Self-pay | Admitting: Cardiovascular Disease

## 2024-04-29 ENCOUNTER — Other Ambulatory Visit: Payer: Self-pay

## 2024-04-29 ENCOUNTER — Emergency Department (HOSPITAL_COMMUNITY)
Admission: EM | Admit: 2024-04-29 | Discharge: 2024-04-30 | Disposition: A | Discharge status: Home / Self Care | Payer: Self-pay | Attending: Student | Admitting: Student

## 2024-04-29 ENCOUNTER — Ambulatory Visit
Admission: RE | Admit: 2024-04-29 | Discharge: 2024-04-29 | Disposition: A | Discharge status: Home / Self Care | Payer: Self-pay | Source: Ambulatory Visit | Attending: Physician Assistant | Admitting: Physician Assistant

## 2024-04-29 ENCOUNTER — Ambulatory Visit: Payer: Self-pay

## 2024-04-29 ENCOUNTER — Emergency Department (HOSPITAL_COMMUNITY): Payer: Self-pay

## 2024-04-29 VITALS — BP 128/82 | HR 57 | Temp 98.0°F | Resp 16

## 2024-04-29 DIAGNOSIS — I2510 Atherosclerotic heart disease of native coronary artery without angina pectoris: Secondary | ICD-10-CM | POA: Insufficient documentation

## 2024-04-29 DIAGNOSIS — Z7982 Long term (current) use of aspirin: Secondary | ICD-10-CM | POA: Insufficient documentation

## 2024-04-29 DIAGNOSIS — R079 Chest pain, unspecified: Secondary | ICD-10-CM

## 2024-04-29 DIAGNOSIS — R42 Dizziness and giddiness: Secondary | ICD-10-CM

## 2024-04-29 DIAGNOSIS — H9202 Otalgia, left ear: Secondary | ICD-10-CM

## 2024-04-29 DIAGNOSIS — Z87891 Personal history of nicotine dependence: Secondary | ICD-10-CM | POA: Insufficient documentation

## 2024-04-29 DIAGNOSIS — E876 Hypokalemia: Secondary | ICD-10-CM | POA: Insufficient documentation

## 2024-04-29 LAB — BASIC METABOLIC PANEL WITH GFR
Anion gap: 10 (ref 5–15)
BUN: 9 mg/dL (ref 6–20)
CO2: 23 mmol/L (ref 22–32)
Calcium: 9.6 mg/dL (ref 8.9–10.3)
Chloride: 104 mmol/L (ref 98–111)
Creatinine, Ser: 0.95 mg/dL (ref 0.61–1.24)
GFR, Estimated: >60 mL/min (ref >60)
Glucose, Bld: 82 mg/dL (ref 70–99)
Potassium: 3.4 mmol/L — ABNORMAL LOW (ref 3.5–5.1)
Sodium: 137 mmol/L (ref 135–145)

## 2024-04-29 LAB — LIPID PANEL
Chol/HDL Ratio: 4.1 ratio (ref 0.0–5.0)
Cholesterol, Total: 154 mg/dL (ref 100–199)
HDL: 38 mg/dL — ABNORMAL LOW (ref >39)
LDL Chol Calc (NIH): 89 mg/dL (ref 0–99)
Triglycerides: 156 mg/dL — ABNORMAL HIGH (ref 0–149)
VLDL Cholesterol Cal: 27 mg/dL (ref 5–40)

## 2024-04-29 LAB — CBC
HCT: 44.5 % (ref 39.0–52.0)
Hemoglobin: 15.3 g/dL (ref 13.0–17.0)
MCH: 31.4 pg (ref 26.0–34.0)
MCHC: 34.4 g/dL (ref 30.0–36.0)
MCV: 91.4 fL (ref 80.0–100.0)
Platelets: 308 10*3/uL (ref 150–400)
RBC: 4.87 MIL/uL (ref 4.22–5.81)
RDW: 12.9 % (ref 11.5–15.5)
WBC: 6.7 10*3/uL (ref 4.0–10.5)
nRBC: 0 % (ref 0.0–0.2)

## 2024-04-29 LAB — HEPATIC FUNCTION PANEL
ALT: 24 U/L (ref 0–44)
AST: 25 U/L (ref 15–41)
Albumin: 4.3 g/dL (ref 3.5–5.0)
Alkaline Phosphatase: 52 U/L (ref 38–126)
Bilirubin, Direct: <0.1 mg/dL (ref 0.0–0.2)
Total Bilirubin: 1 mg/dL (ref 0.0–1.2)
Total Protein: 7.3 g/dL (ref 6.5–8.1)

## 2024-04-29 LAB — PROTIME-INR
INR: 1 (ref 0.8–1.2)
Prothrombin Time: 13.9 s (ref 11.4–15.2)

## 2024-04-29 LAB — TROPONIN I (HIGH SENSITIVITY)
Troponin I (High Sensitivity): 14 ng/L (ref <18)
Troponin I (High Sensitivity): 13 ng/L (ref <18)

## 2024-04-29 MED ORDER — HYDROCORTISONE-ACETIC ACID 1-2 % OT SOLN: 3.0000 [drp] | Freq: Two times a day (BID) | OTIC | 0 refills | Status: AC

## 2024-04-29 NOTE — Discharge Instructions (Signed)
 Please go directly to the ED for further evaluation and management. Your EKG was concerning for a potential heart attack and I recommend further evaluation in the ED to rule this out. If you develop worsening symptoms please call EMS for assistance.   You were seen today for concerns of left ear pain.  Your physical exam is consistent with an infection in your ear canal also known as otitis externa.  I have sent to medication called Acetic acid-hydrocortisone for you to instill in the ear twice per day for 7 days Please try to avoid getting the ear canal read or submerging your head in water as this can cause further infection and contamination in the area If you feel like your symptoms are not improving or seem to be worsening you can return here or follow-up with your PCP for further evaluation If you develop any of the following please return to urgent care or the emergency room as these could be signs of worsening infection: Severe pain of the ear, pain in the surrounding areas such as behind the ear or the jaw, fevers, displacement of the ear due to swelling, severe headaches, copious amounts of drainage and blood from the ear

## 2024-04-29 NOTE — Telephone Encounter (Signed)
 Please send in prescription for Repatha to [facility]

## 2024-04-29 NOTE — Telephone Encounter (Signed)
 Low BP today and yesterday, not much of appetite and dizzy.  BP 109/59. Pt was not sure who he should call first....  advised pt to call PCP first to rule out illness.  Advised to reach back out if cardiology needs to be further involved.  Pt agreeable to plan

## 2024-04-29 NOTE — ED Triage Notes (Signed)
 PT arrivs via POV. Pt c/o chest pain and dizziness for the past couple of days. Was seen a UC pta and was told to come to the ED for an abnormal EKG.

## 2024-04-29 NOTE — ED Triage Notes (Signed)
 Pt presents to UC for c/o dizziness, dull chest pain, low blood pressure x2 days. Pimples in left ear x1 week. Cleaning with hydrogen peroxide.

## 2024-04-29 NOTE — ED Provider Triage Note (Signed)
 Emergency Medicine Provider Triage Evaluation Note  Ricky [name] , a 48 y.o. male  was evaluated in triage.  Pt complains of dizziness, chest pain.  Patient reports this has been ongoing for the last several days and was seen at urgent care advised come in for concerns of abnormal EKG.  Reports 2 prior MIs.  Denies any feelings of chest pain at this time or shortness of breath.  He does recall incidentally knowing that he had some sort of blisters or fluid-filled areas in his left ear.  Unclear this was causing some of his dizziness.  Review of Systems  Positive: As above Negative: As above  Physical Exam  BP (!) 135/110 (BP Location: Right Arm)   Pulse 74   Temp 98.4 F (36.9 C)   Resp 16   SpO2 98%  Gen:   Awake, no distress   Resp:  Normal effort  MSK:   Moves extremities without difficulty  Other:    Medical Decision Making  Medically screening exam initiated at 7:50 PM.  Appropriate orders placed.  Ricky [name] was informed that the remainder of the evaluation will be completed by another provider, this initial triage assessment does not replace that evaluation, and the importance of remaining in the ED until their evaluation is complete.     Jacqueline Delapena A, PA-C 04/29/24 1950

## 2024-04-29 NOTE — Telephone Encounter (Signed)
 Pt c/o BP issue: STAT if pt c/o blurred vision, one-sided weakness or slurred speech.  STAT if BP is GREATER than 180/120 TODAY.  STAT if BP is LESS than 90/60 and SYMPTOMATIC TODAY  1. What is your BP concern? Too low  2. Have you taken any BP medication today?Yes  3. What are your last 5 BP readings?105/56  4. Are you having any other symptoms (ex. Dizziness, headache, blurred vision, passed out)? Dizziness
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
## 2024-04-29 NOTE — Telephone Encounter (Signed)
 FYI Only or Action Required?: FYI only for provider.  Patient was last seen in primary care on 03/17/2024 by Petrina Pries, NP. Called Nurse Triage reporting Dizziness. Symptoms began yesterday. Interventions attempted: Rest, hydration, or home remedies. Symptoms are: gradually worsening.  Triage Disposition: See Physician Within 24 Hours  Patient/caregiver understands and will follow disposition?: Yes  Copied from CRM [id number]. Topic: Clinical - Red Word Triage >> Apr 29, 2024  3:25 PM Marissa P wrote: Red Word that prompted transfer to Nurse Triage: Patient is calling stating that started yesterday that he is feeling lots of fogginess, dizzy light headed, no appetite and sometimes shortness of breathe. Low blood pressure 106/56. Also, think he has pimples in his left ear which been there for about a week. No medications has changed. Would like some immediate help please Reason for Disposition  [1] MODERATE dizziness (e.g., interferes with normal activities) AND [2] has NOT been evaluated by doctor (or NP/PA) for this  (Exception: Dizziness caused by heat exposure, sudden standing, or poor fluid intake.)  Answer Assessment - Initial Assessment Questions 1. DESCRIPTION: Describe your dizziness.     Lightheaded, very tired.  2. LIGHTHEADED: Do you feel lightheaded? (e.g., somewhat faint, woozy, weak upon standing)     Feels faint 3. VERTIGO: Do you feel like either you or the room is spinning or tilting? (i.e. vertigo)     denies 4. SEVERITY: How bad is it?  Do you feel like you are going to faint? Can you stand and walk?   - MILD: Feels slightly dizzy, but walking normally.   - MODERATE: Feels unsteady when walking, but not falling; interferes with normal activities (e.g., school, work).   - SEVERE: Unable to walk without falling, or requires assistance to walk without falling; feels like passing out now.      moderate 5. ONSET:  When did the dizziness begin?     Yesterday 6.  AGGRAVATING FACTORS: Does anything make it worse? (e.g., standing, change in head position)     unsure 7. HEART RATE: Can you tell me your heart rate? How many beats in 15 seconds?  (Note: not all patients can do this)       Denies  8. CAUSE: What do you think is causing the dizziness?     Feels terrible  9. RECURRENT SYMPTOM: Have you had dizziness before? If Yes, ask: When was the last time? What happened that time?     No  10. OTHER SYMPTOMS: Do you have any other symptoms? (e.g., fever, chest pain, vomiting, diarrhea, bleeding)       Chest pain-nitro with effect. BP 106/56. He called cardiology they advised to call pcp. Shortness of breath intermittently. Pimples in ear for one week that are painful.   Additional info: no appointments in office, patient will proceed to urgent care.  Protocols used: Dizziness - Lightheadedness-A-AH

## 2024-04-29 NOTE — ED Notes (Signed)
 Patient is being discharged from the Urgent Care and sent to the Emergency Department via POV . Per Rocky Mecum, PA, patient is in need of higher level of care due to chest pain. Patient is aware and verbalizes understanding of plan of care.  Vitals:   04/29/24 1737  BP: 128/82  Pulse: (!) 57  Resp: 16  Temp: 98 F (36.7 C)  SpO2: 97%

## 2024-04-30 ENCOUNTER — Emergency Department (HOSPITAL_COMMUNITY): Payer: Self-pay

## 2024-04-30 MED ORDER — LACTATED RINGERS IV BOLUS
1000.0000 mL | Freq: Once | INTRAVENOUS | Status: AC
  Administered 2024-04-30: 1000 mL via INTRAVENOUS
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
## 2024-05-03 ENCOUNTER — Ambulatory Visit: Payer: Self-pay | Admitting: Pharmacist

## 2024-05-03 ENCOUNTER — Telehealth: Payer: Self-pay | Admitting: Licensed Clinical Social Worker

## 2024-05-03 MED ORDER — REPATHA SURECLICK 140 MG/ML ~~LOC~~ SOAJ: 140.0000 mg | SUBCUTANEOUS | 3 refills | Status: DC

## 2024-05-03 NOTE — Telephone Encounter (Signed)
 H&V Care Navigation CSW Progress Note  Clinical Social Worker contacted patient by phone to f/u on assistance applications. Reached him at [phone]. He shares since he had two recent hospital visits he hasn't had time to complete his applications, encouraged him to let me know how I can help him do so. Remain available as needed.  Patient is participating in a Managed Medicaid Plan:  No, self pay only  SDOH Screenings   Food Insecurity: No Food Insecurity (03/16/2024)  Housing: Low Risk  (03/16/2024)  Transportation Needs: No Transportation Needs (03/16/2024)  Utilities: Not At Risk (03/15/2024)  Alcohol Screen: Low Risk  (03/16/2024)  Depression (PHQ2-9): Medium Risk (03/17/2024)  Financial Resource Strain: Low Risk  (03/16/2024)  Physical Activity: Sufficiently Active (03/16/2024)  Social Connections: Socially Isolated (03/16/2024)  Stress: Stress Concern Present (03/16/2024)  Tobacco Use: Medium Risk (04/29/2024)  Health Literacy: Adequate Health Literacy (03/15/2024)    Marit Lark, MSW, LCSW Clinical Social Worker [facility] Heart/Vascular Care Navigation  [phone]- work cell phone (preferred)

## 2024-05-03 NOTE — Addendum Note (Signed)
 Addended by: Randa Riss K on: 05/03/2024 08:05 AM   Modules accepted: Orders

## 2024-05-06 ENCOUNTER — Ambulatory Visit (INDEPENDENT_AMBULATORY_CARE_PROVIDER_SITE_OTHER): Payer: Self-pay | Admitting: Family Medicine

## 2024-05-06 ENCOUNTER — Encounter: Payer: Self-pay | Admitting: Family Medicine

## 2024-05-06 ENCOUNTER — Other Ambulatory Visit: Payer: Self-pay

## 2024-05-06 VITALS — BP 114/60 | HR 72 | Ht 68.0 in | Wt 187.0 lb

## 2024-05-06 DIAGNOSIS — F331 Major depressive disorder, recurrent, moderate: Secondary | ICD-10-CM

## 2024-05-06 DIAGNOSIS — J0100 Acute maxillary sinusitis, unspecified: Secondary | ICD-10-CM

## 2024-05-06 DIAGNOSIS — J322 Chronic ethmoidal sinusitis: Secondary | ICD-10-CM

## 2024-05-06 MED ORDER — FLUOXETINE HCL 20 MG PO CAPS
20.0000 mg | ORAL_CAPSULE | Freq: Every day | ORAL | 3 refills | Status: DC
  Filled 2024-05-06: qty 30, 30d supply, fill #0
  Filled 2024-05-31 (×2): qty 30, 30d supply, fill #1
  Filled 2024-06-30 (×2): qty 30, 30d supply, fill #2

## 2024-05-06 MED ORDER — AZELASTINE HCL 137 MCG/SPRAY NA SOLN
1.0000 | Freq: Two times a day (BID) | NASAL | 0 refills | Status: DC
  Filled 2024-05-06: qty 30, 30d supply, fill #0

## 2024-05-06 MED ORDER — DOXYCYCLINE HYCLATE 100 MG PO TABS
100.0000 mg | ORAL_TABLET | Freq: Two times a day (BID) | ORAL | 0 refills | Status: AC
Start: 2024-05-06 — End: 2024-05-13
  Filled 2024-05-06: qty 14, 7d supply, fill #0

## 2024-05-06 MED ORDER — TRIAMCINOLONE ACETONIDE 40 MG/ML IJ SUSP
40.0000 mg | Freq: Once | INTRAMUSCULAR | Status: AC
Start: 2024-05-06 — End: 2024-05-06
  Administered 2024-05-06: 40 mg via INTRAMUSCULAR
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
## 2024-05-13 ENCOUNTER — Telehealth: Payer: Self-pay | Admitting: Pharmacist

## 2024-05-13 ENCOUNTER — Encounter: Payer: Self-pay | Admitting: Family Medicine

## 2024-05-13 NOTE — Telephone Encounter (Signed)
 Purpose of Call: Spoke with the patient to provide clarification on LDL-C goals and associated cardiovascular risk factors.  Discussion: Explained the rationale for aiming for a lower LDL-C target given the patient's individual risk profile. The patient was receptive to the information and expressed understanding of the treatment goals.  Plan: The patient is in agreement with the current treatment plan to continue Repatha  in combination with a statin for the next 3 months, followed by repeat lipid panel to reassess LDL-C response and adjust therapy as needed.

## 2024-05-14 ENCOUNTER — Telehealth: Payer: Self-pay

## 2024-05-14 ENCOUNTER — Ambulatory Visit
Admission: RE | Admit: 2024-05-14 | Discharge: 2024-05-14 | Disposition: A | Discharge status: Home / Self Care | Payer: Self-pay | Source: Ambulatory Visit

## 2024-05-14 ENCOUNTER — Ambulatory Visit: Payer: Self-pay

## 2024-05-14 VITALS — BP 116/73 | HR 62 | Temp 97.8°F | Resp 17

## 2024-05-14 DIAGNOSIS — H6992 Unspecified Eustachian tube disorder, left ear: Secondary | ICD-10-CM

## 2024-05-14 DIAGNOSIS — J0110 Acute frontal sinusitis, unspecified: Secondary | ICD-10-CM

## 2024-05-14 MED ORDER — AMOXICILLIN 875 MG PO TABS: 875.0000 mg | ORAL_TABLET | Freq: Two times a day (BID) | ORAL | 0 refills | Status: AC

## 2024-05-14 MED ORDER — FLUTICASONE PROPIONATE 50 MCG/ACT NA SUSP: 2.0000 | Freq: Every day | NASAL | 0 refills | Status: DC

## 2024-05-14 MED ORDER — CETIRIZINE-PSEUDOEPHEDRINE ER 5-120 MG PO TB12: 1.0000 | ORAL_TABLET | Freq: Every day | ORAL | 0 refills | Status: AC

## 2024-05-14 MED ORDER — PREDNISONE 20 MG PO TABS: 40.0000 mg | ORAL_TABLET | Freq: Every day | ORAL | 0 refills | Status: DC

## 2024-05-14 NOTE — Telephone Encounter (Signed)
 Copied from CRM [id number]. Topic: Clinical - Medical Advice >> May 14, 2024 10:26 AM Ricky [name] wrote: Reason for CRM: Patient called in stating he isnt getting much better, has been doing the spray and he is out of antibiotics not sure if he needs more or wait a couple day, would like for a calback regarding this   Lvm for patient - cm, cma

## 2024-05-14 NOTE — Discharge Instructions (Addendum)
 Your symptoms are most likely due to eustachian tube dysfunction which can occur when the eustachian tube becomes blocked or fails to open properly, often as a result of sinus infection.Take all medications as prescribed and be sure to complete the full course, even if you begin to feel better. You may take Tylenol  or ibuprofen  as needed for pain or fever. Stay well hydrated by drinking enough fluids to keep your urine a light yellow color, as this helps thin mucus. Use a cool mist humidifier to maintain indoor humidity above 50% and consider inhaling steam for 10 to 15 minutes, several times a day, either in the bathroom with a hot shower running or using vapor shower tablets to ease nasal congestion. Try to avoid cold or dry air and sleep with your head elevated to reduce post-nasal drip. Adequate rest each night is also important for recovery. Once your symptoms improve and you've finished your medications, replace your toothbrush to help avoid re-infection. If your symptoms do not improve after completing your treatment or if they worsen, follow up with your primary care provider. Seek emergency care if you develop severe pain, swelling around the ear, hearing loss, dizziness, or high fever.

## 2024-05-14 NOTE — Telephone Encounter (Signed)
 FYI Only or Action Required?: Action required by provider: update on patient condition.  Patient was last seen in primary care on 05/06/2024 by Petrina Pries, NP.  Called Nurse Triage reporting Otalgia and Sinusitis.  Symptoms began a week ago.  Interventions attempted: OTC medications: Tyenol, Prescription medications: Azelastine , and Other: Neti Rinse daily.  Symptoms are: cough, fatigue, sinus pain and congestion, left earache/pressure with decreased hearinggradually worsening.  Triage Disposition: See Physician Within 24 Hours  Patient/caregiver understands and will follow disposition?: Yes          Copied from CRM [id number]. Topic: Clinical - Red Word Triage >> May 14, 2024 12:31 PM Ricky [name] wrote: Red Word that prompted transfer to Nurse Triage: patient pain left in ear, congestion, coughing, pressure in head, hard to hear Reason for Disposition  [1] Taking antibiotic > 72 hours (3 days) AND [2] sinus pain not improved  Answer Assessment - Initial Assessment Questions Per chart, patient missed call from CMA Ciera at 12:17pm, he states he called back in to return her call but office is closed now. Advised patient due to office being closed now to go to urgent care, patient is agreeable.    1. ANTIBIOTIC: What antibiotic are you taking? How many times a day?     Doxycycline , twice daily.  2. ONSET: When was the antibiotic started?     05/06/24, completed 7 day course.  3. PAIN: How bad is the pain?   (Scale 0-10; or none, mild, moderate or severe)     7-8/10.  4. FEVER: Do you have a fever? If Yes, ask: What is it, how was it measured, and when did it start?      No.  5. SYMPTOMS: Are there any other symptoms you're concerned about? If Yes, ask: When did it start?     7-8/10 sinus pressure/pain (ear, behind eyes, top of head), left ear pain/pressure, congestion, decreased hearing in left ear, fatigue/increased tiredness, intermittent cough.  6.  PREGNANCY: Is there any chance you are pregnant? When was your last menstrual period?     N/A.  Protocols used: Sinus Infection on Antibiotic Follow-up Call-[facility]

## 2024-05-14 NOTE — ED Triage Notes (Signed)
 Pt seen at [facility] 7/3 for left ear pain/fullness and dull chest pain. Pt provided ear drops and sent to ER for further evaluation due to heart history.   He was also seen at PCP Thursday and given antibiotic.  Presents to UC due to continue left ear pain and fullness which is now causing left facial pressure
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
## 2024-05-17 ENCOUNTER — Other Ambulatory Visit: Payer: Self-pay

## 2024-05-19 ENCOUNTER — Other Ambulatory Visit: Payer: Self-pay

## 2024-05-20 ENCOUNTER — Ambulatory Visit (INDEPENDENT_AMBULATORY_CARE_PROVIDER_SITE_OTHER): Payer: Self-pay | Admitting: Nurse Practitioner

## 2024-05-20 ENCOUNTER — Ambulatory Visit: Payer: Self-pay

## 2024-05-20 ENCOUNTER — Encounter: Payer: Self-pay | Admitting: Nurse Practitioner

## 2024-05-20 ENCOUNTER — Other Ambulatory Visit: Payer: Self-pay

## 2024-05-20 VITALS — BP 110/60 | HR 60 | Temp 97.7°F | Ht 68.0 in | Wt 186.4 lb

## 2024-05-20 DIAGNOSIS — E663 Overweight: Secondary | ICD-10-CM

## 2024-05-20 DIAGNOSIS — Z6828 Body mass index (BMI) 28.0-28.9, adult: Secondary | ICD-10-CM

## 2024-05-20 DIAGNOSIS — J0100 Acute maxillary sinusitis, unspecified: Secondary | ICD-10-CM

## 2024-05-20 MED ORDER — MONTELUKAST SODIUM 10 MG PO TABS
10.0000 mg | ORAL_TABLET | Freq: Every day | ORAL | 2 refills | Status: DC
  Filled 2024-05-20: qty 30, 30d supply, fill #0

## 2024-05-20 MED ORDER — AZELASTINE HCL 137 MCG/SPRAY NA SOLN
2.0000 | Freq: Every day | NASAL | 2 refills | Status: DC
  Filled 2024-05-20: qty 30, fill #0
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
## 2024-05-20 NOTE — Patient Instructions (Signed)
 Continue your antihistamine and take the azesataline nasal spray.  Send a message on Tuesday if not better. We may need to give you a rocephin injection We may need to consider a referral to ENT if not getting better.
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
## 2024-05-31 ENCOUNTER — Other Ambulatory Visit: Payer: Self-pay

## 2024-05-31 ENCOUNTER — Ambulatory Visit (HOSPITAL_COMMUNITY)
Admission: RE | Admit: 2024-05-31 | Discharge: 2024-05-31 | Disposition: A | Discharge status: Home / Self Care | Payer: Self-pay | Source: Ambulatory Visit | Attending: Cardiology | Admitting: Cardiology

## 2024-05-31 DIAGNOSIS — I25110 Atherosclerotic heart disease of native coronary artery with unstable angina pectoris: Secondary | ICD-10-CM | POA: Insufficient documentation

## 2024-05-31 DIAGNOSIS — R072 Precordial pain: Secondary | ICD-10-CM | POA: Insufficient documentation

## 2024-05-31 DIAGNOSIS — I214 Non-ST elevation (NSTEMI) myocardial infarction: Secondary | ICD-10-CM | POA: Insufficient documentation

## 2024-05-31 DIAGNOSIS — I255 Ischemic cardiomyopathy: Secondary | ICD-10-CM | POA: Insufficient documentation

## 2024-05-31 LAB — ECHOCARDIOGRAM COMPLETE
Area-P 1/2: 2.24 cm2
S' Lateral: 3.5 cm

## 2024-05-31 NOTE — Progress Notes (Signed)
 Cardiology Office Note:  .   Date:  06/14/2024  ID:  Ricky [name], DOB May 03, 1976, MRN [id number] PCP: Petrina Pries, NP  [facility] Providers Cardiologist:  Lonni Cash, MD    History of Present Illness: .   Ricky [name] is a 48 y.o. male  a history of coronary artery disease with a previous LAD stent placed in 2017. In 2019, a relook left heart catheterization showed a jail diagonal vessel with ostial haziness, managed medically. He was lost to cardiology follow-up until he presented to the hospital on February 22, 2024, with chest pain and was admitted with anterolateral ST elevation. He underwent PTCA and drug-eluting stent PCI of the previous LAD stent.  LV gram during cath reflected significant systolic dysfunction but on 04/28 TTE, LVEF dramatically improved.  In ED 04/29/24 with dizziness due to working in the heat, excessive sweating improved with fluids.  Patient works in Restaurant manager, fast food and Friday in the heat he became dehydrated and vomited 6 times. [name] been having a dull ache in his chest and is relieved with NTG. Chest pain occurs with activity at work. Sometimes off/on all day depending on how much he's working. Someday's he doesn't have it at all. He thinks his chest pain improved some since April but never pain free.  ROS:    Studies Reviewed: SABRA         Prior CV Studies:   Echo 05/2024 IMPRESSIONS     1. Left ventricular ejection fraction, by estimation, is 50 to 55%. Left  ventricular ejection fraction by 3D volume is 54 %. The left ventricle has  low normal function. The left ventricle demonstrates regional wall motion  abnormalities (see scoring  diagram/findings for description). Left ventricular diastolic parameters  are indeterminate. There is hypokinesis of the left ventricular, apical  anterior wall. There is hypokinesis of the left ventricular, apical  segment.   2. Right ventricular systolic function is normal. The right  ventricular  size is normal.   3. The mitral valve is normal in structure. No evidence of mitral valve  regurgitation. No evidence of mitral stenosis.   4. The aortic valve is tricuspid. Aortic valve regurgitation is not  visualized. Aortic valve sclerosis/calcification is present, without any  evidence of aortic stenosis.   5. Aortic dilatation noted. There is mild dilatation of the ascending  aorta, measuring 38 mm.   6. The inferior vena cava is normal in size with greater than 50%  respiratory variability, suggesting right atrial pressure of 3 mmHg.   02/22/24 LHC   1.  Acute anterolateral STEMI secondary to very late stent thrombosis of 3.5 mm LAD stent 2.  Patent left main, left circumflex, and RCA with mild nonobstructive plaquing 3.  Severe segmental LV dysfunction with anteroapical akinesis and areas of dyskinesis, LVEF estimated at 35% 4.  Successful IVUS guided PCI of the LAD using a 3.5 x 32 mm Synergy DES.  IVUS demonstrates reference vessel diameter of 3.5 mm and thrombus in multiple areas of the old stent even after redilatation.  Findings necessitated restenting of the LAD. 5.  Provisional angioplasty of the second diagonal branch in order to keep the branch patent using a 2.0 x 15 mm balloon, 90% stenosis present both pre and post stenting of the LAD   Recommendations: Patient loaded with prasugrel  60 mg in the Cath Lab.  Aggrastat  will be continued for 2 hours.  Consider lifelong DAPT as tolerated following very late stent thrombosis.  CV-ICU care,  post MI medical therapy.  LVEDP elevated at 23 mmHg, but will hold off on IV diuresis as his blood pressure is borderline.  Check 2D echocardiogram.   Diagnostic Dominance: Right  Intervention     02/23/24 TTE   IMPRESSIONS     1. Left ventricular ejection fraction, by estimation, is 50 to 55%. The  left ventricle has low normal function. The left ventricle demonstrates  regional wall motion abnormalities (see scoring  diagram/findings for  description). Left ventricular diastolic   parameters are consistent with Grade I diastolic dysfunction (impaired  relaxation). The mid inferoseptal segment, apical septal segment, apical  anterior segment, apical inferior segment, and apex are hypokinetic. . The  average left ventricular global  longitudinal strain is -13.4 %. The global longitudinal strain is  abnormal.   2. Right ventricular systolic function is normal. The right ventricular  size is normal.   3. The mitral valve is normal in structure. No evidence of mitral valve  regurgitation. No evidence of mitral stenosis.   4. The aortic valve is tricuspid. Aortic valve regurgitation is not  visualized. No aortic stenosis is present.   5. The inferior vena cava is normal in size with <50% respiratory  variability, suggesting right atrial pressure of 8 mmHg.   Comparison(s): No prior Echocardiogram.   FINDINGS   Left Ventricle: Left ventricular ejection fraction, by estimation, is 50  to 55%. The left ventricle has low normal function. The left ventricle  demonstrates regional wall motion abnormalities. The average left  ventricular global longitudinal strain is  -13.4 %. Strain was performed and the global longitudinal strain is  abnormal. The left ventricular internal cavity size was normal in size.  There is no left ventricular hypertrophy. Left ventricular diastolic  parameters are consistent with Grade I  diastolic dysfunction (impaired relaxation).     LV Wall Scoring:  The mid inferoseptal segment, apical septal segment, apical anterior  segment,  apical inferior segment, and apex are hypokinetic. The mid inferoseptal  segment, apical septal segment, apical anterior segment, apical inferior  segment, and apex are hypokinetic.   Right Ventricle: The right ventricular size is normal. No increase in  right ventricular wall thickness. Right ventricular systolic function is  normal.   Left  Atrium: Left atrial size was normal in size.   Right Atrium: Right atrial size was normal in size.   Pericardium: There is no evidence of pericardial effusion.   Mitral Valve: The mitral valve is normal in structure. No evidence of  mitral valve regurgitation. No evidence of mitral valve stenosis.   Tricuspid Valve: The tricuspid valve is normal in structure. Tricuspid  valve regurgitation is trivial. No evidence of tricuspid stenosis.   Aortic Valve: The aortic valve is tricuspid. Aortic valve regurgitation is  not visualized. No aortic stenosis is present. Aortic valve mean gradient  measures 3.0 mmHg. Aortic valve peak gradient measures 5.7 mmHg. Aortic  valve area, by VTI measures 2.79  cm.   Pulmonic Valve: The pulmonic valve was normal in structure. Pulmonic valve  regurgitation is not visualized. No evidence of pulmonic stenosis.   Aorta: The aortic root and ascending aorta are structurally normal, with  no evidence of dilitation.   Venous: The inferior vena cava is normal in size with less than 50%  respiratory variability, suggesting right atrial pressure of 8 mmHg.   IAS/Shunts: No atrial level shunt detected by color flow Doppler.     Risk Assessment/Calculations:  Physical Exam:   VS:  BP 110/60   Pulse 65   Wt 183 lb (83 kg)   BMI 27.83 kg/m    Orhtostatics: No data found. Wt Readings from Last 3 Encounters:  06/14/24 183 lb (83 kg)  05/20/24 186 lb 6.4 oz (84.6 kg)  05/06/24 187 lb (84.8 kg)    GEN: Well nourished, well developed in no acute distress NECK: No JVD; No carotid bruits CARDIAC:  RRR, no murmurs, rubs, gallops RESPIRATORY:  Clear to auscultation without rales, wheezing or rhonchi  ABDOMEN: Soft, non-tender, non-distended EXTREMITIES:  No edema; No deformity   ASSESSMENT AND PLAN: .    CAD s/p STEMI due to very lated stent thrombosis DES LAD 01/2024 with residual 90% stenosis pre and post stenting with ongoing exertional  angina relieved with NTG~5 weekly. Will discuss with Dr. Verlin further testing-cath vs stress testing. Not much room to move on meds. Recheck bmet-K low in ED.  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  HLD LDL 89 trig 156 04/29/24          Dispo:    Signed, Olivia Pavy, PA-C

## 2024-06-01 NOTE — Addendum Note (Signed)
 Addended by: MEMORY DELON POUR on: 06/01/2024 07:37 AM   Modules accepted: Orders

## 2024-06-07 DIAGNOSIS — J0100 Acute maxillary sinusitis, unspecified: Secondary | ICD-10-CM | POA: Insufficient documentation

## 2024-06-07 NOTE — Assessment & Plan Note (Signed)
 Since this is ongoing I will send a Rx for singulair  and azestaline nasal spray. If not better return call to office.

## 2024-06-14 ENCOUNTER — Other Ambulatory Visit: Payer: Self-pay

## 2024-06-14 ENCOUNTER — Other Ambulatory Visit: Payer: Self-pay | Admitting: *Deleted

## 2024-06-14 ENCOUNTER — Ambulatory Visit: Payer: Self-pay | Attending: Cardiovascular Disease | Admitting: Physician Assistant

## 2024-06-14 VITALS — BP 110/60 | HR 65 | Wt 183.0 lb

## 2024-06-14 DIAGNOSIS — I25110 Atherosclerotic heart disease of native coronary artery with unstable angina pectoris: Secondary | ICD-10-CM

## 2024-06-14 DIAGNOSIS — Z79899 Other long term (current) drug therapy: Secondary | ICD-10-CM

## 2024-06-14 DIAGNOSIS — E785 Hyperlipidemia, unspecified: Secondary | ICD-10-CM

## 2024-06-14 DIAGNOSIS — I255 Ischemic cardiomyopathy: Secondary | ICD-10-CM

## 2024-06-14 DIAGNOSIS — I2089 Other forms of angina pectoris: Secondary | ICD-10-CM

## 2024-06-14 LAB — BASIC METABOLIC PANEL WITH GFR
BUN/Creatinine Ratio: 9 (ref 9–20)
BUN: 9 mg/dL (ref 6–24)
CO2: 22 mmol/L (ref 20–29)
Calcium: 9.5 mg/dL (ref 8.7–10.2)
Chloride: 102 mmol/L (ref 96–106)
Creatinine, Ser: 1.03 mg/dL (ref 0.76–1.27)
Glucose: 84 mg/dL (ref 70–99)
Potassium: 4 mmol/L (ref 3.5–5.2)
Sodium: 140 mmol/L (ref 134–144)
eGFR: 91 mL/min/1.73 (ref >59)

## 2024-06-14 LAB — CBC
Hematocrit: 44.2 % (ref 37.5–51.0)
Hemoglobin: 15 g/dL (ref 13.0–17.7)
MCH: 32.4 pg (ref 26.6–33.0)
MCHC: 33.9 g/dL (ref 31.5–35.7)
MCV: 96 fL (ref 79–97)
Platelets: 295 x10E3/uL (ref 150–450)
RBC: 4.63 x10E6/uL (ref 4.14–5.80)
RDW: 13.8 % (ref 11.6–15.4)
WBC: 4.9 x10E3/uL (ref 3.4–10.8)

## 2024-06-14 MED ORDER — REPATHA SURECLICK 140 MG/ML ~~LOC~~ SOAJ
140.0000 mg | SUBCUTANEOUS | 3 refills | Status: AC
  Filled 2024-06-14: qty 6, 84d supply, fill #0

## 2024-06-14 MED ORDER — METOPROLOL SUCCINATE ER 25 MG PO TB24
25.0000 mg | ORAL_TABLET | Freq: Every day | ORAL | 3 refills | Status: AC
  Filled 2024-06-14: qty 90, 90d supply, fill #0
  Filled 2024-09-12 – 2024-09-13 (×2): qty 90, 90d supply, fill #1

## 2024-06-14 MED ORDER — LOSARTAN POTASSIUM 25 MG PO TABS
12.5000 mg | ORAL_TABLET | Freq: Every day | ORAL | 3 refills | Status: AC
  Filled 2024-06-14: qty 45, 90d supply, fill #0
  Filled 2024-09-12 – 2024-09-13 (×2): qty 45, 90d supply, fill #1

## 2024-06-14 MED ORDER — FUROSEMIDE 20 MG PO TABS
20.0000 mg | ORAL_TABLET | ORAL | 3 refills | Status: AC | PRN
  Filled 2024-06-14: qty 90, 90d supply, fill #0

## 2024-06-14 MED ORDER — PRASUGREL HCL 10 MG PO TABS
10.0000 mg | ORAL_TABLET | Freq: Every day | ORAL | 3 refills | Status: AC
  Filled 2024-06-14: qty 30, 30d supply, fill #0
  Filled 2024-07-14: qty 30, 30d supply, fill #1
  Filled 2024-08-15: qty 30, 30d supply, fill #2
  Filled 2024-09-12: qty 30, 30d supply, fill #3
  Filled 2024-09-13: qty 90, 90d supply, fill #3
  Filled 2024-09-13: qty 30, 30d supply, fill #3
  Filled 2024-10-17: qty 30, 30d supply, fill #4
  Filled 2024-11-16: qty 30, 30d supply, fill #5

## 2024-06-14 MED ORDER — RANOLAZINE ER 500 MG PO TB12
500.0000 mg | ORAL_TABLET | Freq: Two times a day (BID) | ORAL | 3 refills | Status: DC
  Filled 2024-06-14: qty 60, 30d supply, fill #0

## 2024-06-14 MED ORDER — NITROGLYCERIN 0.4 MG SL SUBL
0.4000 mg | SUBLINGUAL_TABLET | SUBLINGUAL | 3 refills | Status: AC | PRN
  Filled 2024-06-14: qty 25, 9d supply, fill #0

## 2024-06-14 MED ORDER — ASPIRIN EC 81 MG PO TBEC
81.0000 mg | DELAYED_RELEASE_TABLET | Freq: Every day | ORAL | 3 refills | Status: AC
  Filled 2024-06-14: qty 90, 90d supply, fill #0
  Filled 2024-09-12: qty 30, 30d supply, fill #1
  Filled 2024-10-17: qty 30, 30d supply, fill #2
  Filled 2024-11-16 (×2): qty 30, 30d supply, fill #3

## 2024-06-14 MED ORDER — ROSUVASTATIN CALCIUM 40 MG PO TABS
40.0000 mg | ORAL_TABLET | Freq: Every day | ORAL | 3 refills | Status: AC
  Filled 2024-06-14: qty 90, 90d supply, fill #0
  Filled 2024-09-12 – 2024-09-13 (×2): qty 90, 90d supply, fill #1

## 2024-06-14 MED ORDER — ISOSORBIDE MONONITRATE ER 30 MG PO TB24
30.0000 mg | ORAL_TABLET | Freq: Every day | ORAL | 3 refills | Status: AC
  Filled 2024-06-14: qty 90, 90d supply, fill #0
  Filled 2024-09-12 – 2024-09-13 (×2): qty 90, 90d supply, fill #1

## 2024-06-14 MED ORDER — RANOLAZINE ER 1000 MG PO TB12
1000.0000 mg | ORAL_TABLET | Freq: Two times a day (BID) | ORAL | 2 refills | Status: AC
  Filled 2024-06-14: qty 180, 90d supply, fill #0
  Filled 2024-06-14: qty 60, 30d supply, fill #0
  Filled 2024-07-10 (×2): qty 60, 30d supply, fill #1
  Filled 2024-08-15: qty 60, 30d supply, fill #2
  Filled 2024-09-12 – 2024-09-13 (×2): qty 60, 30d supply, fill #3
  Filled 2024-10-17: qty 60, 30d supply, fill #4
  Filled 2024-11-16: qty 60, 30d supply, fill #5

## 2024-06-14 NOTE — Telephone Encounter (Signed)
 Called patient to let him know that the dose of his Ranexa  has been increased to 1000 mg twice daily per Dr. Verlin. I sent in a new prescription for the new dosage as well as scheduled the patient's follow up visit with Dr. Verlin on 07-19-24 at 8:40 AM. Patient understood and had no questions.

## 2024-06-14 NOTE — Patient Instructions (Addendum)
 Medication Instructions:  DECREASE YOUR LASIX  TO 20 MG AS NEEDED.  CONTINUE ALL OTHER CURRENT MEDICATION THERAPY.  Lab Work: Nutritional therapist AND CBC TO BE DONE TODAY.  Testing/Procedures: NONE  Follow-Up: At [facility], you and your health needs are our priority.  As part of our continuing mission to provide you with exceptional heart care, our providers are all part of one team.  This team includes your primary Cardiologist (physician) and Advanced Practice Providers or APPs (Physician Assistants and Nurse Practitioners) who all work together to provide you with the care you need, when you need it.  Your next appointment:   6 MONTHS  Provider:   Lonni Cash, MD

## 2024-06-15 ENCOUNTER — Other Ambulatory Visit: Payer: Self-pay

## 2024-06-15 ENCOUNTER — Ambulatory Visit: Payer: Self-pay | Admitting: Physician Assistant

## 2024-06-30 ENCOUNTER — Other Ambulatory Visit: Payer: Self-pay

## 2024-07-05 ENCOUNTER — Encounter (HOSPITAL_COMMUNITY): Payer: Self-pay | Admitting: Emergency Medicine

## 2024-07-05 ENCOUNTER — Ambulatory Visit (HOSPITAL_COMMUNITY)
Admission: EM | Admit: 2024-07-05 | Discharge: 2024-07-05 | Disposition: A | Discharge status: Home / Self Care | Payer: Self-pay | Attending: Internal Medicine | Admitting: Internal Medicine

## 2024-07-05 DIAGNOSIS — Z23 Encounter for immunization: Secondary | ICD-10-CM

## 2024-07-05 DIAGNOSIS — S51812A Laceration without foreign body of left forearm, initial encounter: Secondary | ICD-10-CM

## 2024-07-05 MED ORDER — LIDOCAINE-EPINEPHRINE 1 %-1:100000 IJ SOLN
INTRAMUSCULAR | Status: AC
  Filled 2024-07-05: qty 1

## 2024-07-05 MED ORDER — TETANUS-DIPHTH-ACELL PERTUSSIS 5-2.5-18.5 LF-MCG/0.5 IM SUSY
PREFILLED_SYRINGE | INTRAMUSCULAR | Status: AC
  Filled 2024-07-05: qty 0.5

## 2024-07-05 MED ORDER — TETANUS-DIPHTH-ACELL PERTUSSIS 5-2.5-18.5 LF-MCG/0.5 IM SUSY
0.5000 mL | PREFILLED_SYRINGE | Freq: Once | INTRAMUSCULAR | Status: AC
  Administered 2024-07-05: 0.5 mL via INTRAMUSCULAR

## 2024-07-05 NOTE — ED Triage Notes (Signed)
 Pt reports cut left wrist/forearm with razor blade when opening boxes. Pt takes daily aspirin  and believes takes another blood thinner.
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
## 2024-07-05 NOTE — Discharge Instructions (Addendum)
 Laceration repair of the left forearm.  9 sutures are in place.  Tetanus updated today due to duration since last tetanus.  Instructions: Leave the current dressing in place until tonight then may remove and wash the area with soap and water.  Replace with a clean dry dressing. Recommend changing the dressing 1-2 times daily for the first 3 days then may leave open to air and less the area will be exposed to contamination Okay to shower but do not submerge the area completely underwater Recommend returning in 7 days for suture removal Return sooner if there is issues with the wound including but not limited to redness, increased pain, swelling, purulent drainage

## 2024-07-10 ENCOUNTER — Other Ambulatory Visit: Payer: Self-pay

## 2024-07-12 ENCOUNTER — Other Ambulatory Visit: Payer: Self-pay

## 2024-07-14 ENCOUNTER — Other Ambulatory Visit: Payer: Self-pay

## 2024-07-19 ENCOUNTER — Ambulatory Visit: Payer: Self-pay | Attending: Cardiovascular Disease | Admitting: Cardiovascular Disease

## 2024-07-19 VITALS — BP 110/70 | HR 57 | Ht 68.0 in | Wt 194.0 lb

## 2024-07-19 DIAGNOSIS — I25118 Atherosclerotic heart disease of native coronary artery with other forms of angina pectoris: Secondary | ICD-10-CM

## 2024-07-19 DIAGNOSIS — E785 Hyperlipidemia, unspecified: Secondary | ICD-10-CM

## 2024-07-19 NOTE — Patient Instructions (Signed)
 Medication Instructions:  Your physician recommends that you continue on your current medications as directed. Please refer to the Current Medication list given to you today.  *If you need a refill on your cardiac medications before your next appointment, please call your pharmacy*  Lab Work: none If you have labs (blood work) drawn today and your tests are completely normal, you will receive your results only by: MyChart Message (if you have MyChart) OR A paper copy in the mail If you have any lab test that is abnormal or we need to change your treatment, we will call you to review the results.  Testing/Procedures: none  Follow-Up: At [facility], you and your health needs are our priority.  As part of our continuing mission to provide you with exceptional heart care, our providers are all part of one team.  This team includes your primary Cardiologist (physician) and Advanced Practice Providers or APPs (Physician Assistants and Nurse Practitioners) who all work together to provide you with the care you need, when you need it.  Your next appointment:   April 2026  Provider:   Lonni Cash, MD    We recommend signing up for the patient portal called MyChart.  Sign up information is provided on this After Visit Summary.  MyChart is used to connect with patients for Virtual Visits (Telemedicine).  Patients are able to view lab/test results, encounter notes, upcoming appointments, etc.  Non-urgent messages can be sent to your provider as well.   To learn more about what you can do with MyChart, go to [email or web address].   Other Instructions
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
## 2024-07-21 ENCOUNTER — Ambulatory Visit (INDEPENDENT_AMBULATORY_CARE_PROVIDER_SITE_OTHER): Payer: Self-pay | Admitting: Family Medicine

## 2024-07-21 ENCOUNTER — Encounter: Payer: Self-pay | Admitting: Family Medicine

## 2024-07-21 ENCOUNTER — Other Ambulatory Visit: Payer: Self-pay

## 2024-07-21 VITALS — BP 120/76 | HR 63 | Temp 98.2°F | Ht 68.0 in | Wt 192.0 lb

## 2024-07-21 DIAGNOSIS — F331 Major depressive disorder, recurrent, moderate: Secondary | ICD-10-CM

## 2024-07-21 DIAGNOSIS — E785 Hyperlipidemia, unspecified: Secondary | ICD-10-CM

## 2024-07-21 DIAGNOSIS — Z716 Tobacco abuse counseling: Secondary | ICD-10-CM

## 2024-07-21 DIAGNOSIS — I110 Hypertensive heart disease with heart failure: Secondary | ICD-10-CM

## 2024-07-21 DIAGNOSIS — I5042 Chronic combined systolic (congestive) and diastolic (congestive) heart failure: Secondary | ICD-10-CM

## 2024-07-21 DIAGNOSIS — I5043 Acute on chronic combined systolic (congestive) and diastolic (congestive) heart failure: Secondary | ICD-10-CM

## 2024-07-21 DIAGNOSIS — I25110 Atherosclerotic heart disease of native coronary artery with unstable angina pectoris: Secondary | ICD-10-CM

## 2024-07-21 DIAGNOSIS — Z2821 Immunization not carried out because of patient refusal: Secondary | ICD-10-CM

## 2024-07-21 LAB — CBC
Hematocrit: 44.2 % (ref 37.5–51.0)
Hemoglobin: 15 g/dL (ref 13.0–17.7)
MCH: 33.9 pg — ABNORMAL HIGH (ref 26.6–33.0)
MCHC: 33.9 g/dL (ref 31.5–35.7)
MCV: 100 fL — ABNORMAL HIGH (ref 79–97)
Platelets: 281 x10E3/uL (ref 150–450)
RBC: 4.43 x10E6/uL (ref 4.14–5.80)
RDW: 14 % (ref 11.6–15.4)
WBC: 7 x10E3/uL (ref 3.4–10.8)

## 2024-07-21 LAB — CMP14+EGFR
ALT: 26 IU/L (ref 0–44)
AST: 26 IU/L (ref 0–40)
Albumin: 4.6 g/dL (ref 4.1–5.1)
Alkaline Phosphatase: 54 IU/L (ref 47–123)
BUN/Creatinine Ratio: 12 (ref 9–20)
BUN: 12 mg/dL (ref 6–24)
Bilirubin Total: 0.5 mg/dL (ref 0.0–1.2)
CO2: 22 mmol/L (ref 20–29)
Calcium: 9.7 mg/dL (ref 8.7–10.2)
Chloride: 101 mmol/L (ref 96–106)
Creatinine, Ser: 1.02 mg/dL (ref 0.76–1.27)
Globulin, Total: 2 g/dL (ref 1.5–4.5)
Glucose: 82 mg/dL (ref 70–99)
Potassium: 4.4 mmol/L (ref 3.5–5.2)
Sodium: 138 mmol/L (ref 134–144)
Total Protein: 6.6 g/dL (ref 6.0–8.5)
eGFR: 92 mL/min/1.73 (ref >59)

## 2024-07-21 LAB — LIPID PANEL
Chol/HDL Ratio: 1.7 ratio (ref 0.0–5.0)
Cholesterol, Total: 110 mg/dL (ref 100–199)
HDL: 64 mg/dL (ref >39)
LDL Chol Calc (NIH): 23 mg/dL (ref 0–99)
Triglycerides: 136 mg/dL (ref 0–149)
VLDL Cholesterol Cal: 23 mg/dL (ref 5–40)

## 2024-07-21 MED ORDER — BUPROPION HCL ER (SR) 150 MG PO TB12
ORAL_TABLET | ORAL | 2 refills | Status: DC
  Filled 2024-07-21: qty 60, 34d supply, fill #0
  Filled 2024-08-19: qty 60, 30d supply, fill #1
  Filled 2024-09-20 – 2024-09-21 (×2): qty 60, 30d supply, fill #2
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
## 2024-07-30 ENCOUNTER — Ambulatory Visit: Payer: Self-pay | Admitting: Family Medicine

## 2024-07-30 DIAGNOSIS — I5042 Chronic combined systolic (congestive) and diastolic (congestive) heart failure: Secondary | ICD-10-CM | POA: Insufficient documentation

## 2024-07-30 DIAGNOSIS — Z716 Tobacco abuse counseling: Secondary | ICD-10-CM | POA: Insufficient documentation

## 2024-07-30 NOTE — Progress Notes (Signed)
 Cholesterol levels are better and you over blood count are stable. Hope you are still doing good with the cigarettes? No more side effects from withdrawal?  Keep up the good work

## 2024-08-09 ENCOUNTER — Ambulatory Visit
Admission: RE | Admit: 2024-08-09 | Discharge: 2024-08-09 | Disposition: A | Discharge status: Home / Self Care | Payer: Self-pay | Source: Ambulatory Visit | Attending: Physician Assistant | Admitting: Physician Assistant

## 2024-08-09 ENCOUNTER — Other Ambulatory Visit: Payer: Self-pay

## 2024-08-09 VITALS — BP 125/81 | HR 60 | Temp 98.1°F | Resp 18 | Ht 68.0 in | Wt 190.0 lb

## 2024-08-09 DIAGNOSIS — M5441 Lumbago with sciatica, right side: Secondary | ICD-10-CM

## 2024-08-09 MED ORDER — PREDNISONE 20 MG PO TABS: ORAL_TABLET | ORAL | 0 refills | Status: DC
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
## 2024-08-09 NOTE — Discharge Instructions (Addendum)
 VISIT SUMMARY:  You came in today because of right-sided lower back pain that started last week, went away, and then came back today after you twisted while holding a piece of wood. The pain radiates down to just above your knee and gets worse with prolonged sitting. It feels better when you stand on your left leg. You have been doing heavy lifting recently, which may have caused this pain. You have tried ice packs and warm compresses at home without relief.  YOUR PLAN:  -RIGHT LOWER BACK MUSCLE STRAIN WITH LUMBAR RADICULOPATHY: You have a muscle strain in your lower back on the right side, which is causing pain that radiates down your leg. This is likely due to recent heavy lifting and twisting motions. To help with the inflammation, you will start a steroid taper. Continue taking acetaminophen  for pain relief. I have provided you with stretches and exercises to improve your flexibility and strength. Use warm compresses before stretching to help relax your muscles, but do not apply them for more than 30 minutes at a time. Rest and avoid heavy lifting for a few days to help your recovery. I will also provide you with a work note for your absence due to back pain.  INSTRUCTIONS:  Follow the steroid taper as prescribed and continue taking acetaminophen  for pain. Perform the stretches and exercises provided, using warm compresses beforehand for no more than 30 minutes. Rest and avoid heavy lifting for a few days. A work note has been provided for your absence due to back pain.  If your symptoms are not improving or seem to be getting worse I recommend following up with your primary care provider for ongoing management and monitoring.

## 2024-08-09 NOTE — ED Triage Notes (Signed)
 Pt presents with a chief complaint of lower back pain on the right side. States his pain began one week ago, did improve over the weekend. Twisted body the wrong way today and pain has now returned. Currently rates overall pain a 9/10. Two Tylenol  taken one hour PTA with no improvement/relief. Pt reports he cannot sit very long. Has tingling/numbness in buttocks if he does sit for periods of time. Unable to sit in triage. Currently standing.

## 2024-08-16 ENCOUNTER — Other Ambulatory Visit: Payer: Self-pay

## 2024-08-19 ENCOUNTER — Other Ambulatory Visit: Payer: Self-pay

## 2024-08-19 ENCOUNTER — Ambulatory Visit (INDEPENDENT_AMBULATORY_CARE_PROVIDER_SITE_OTHER): Payer: Self-pay | Admitting: Family Medicine

## 2024-08-19 VITALS — BP 116/80 | HR 64 | Temp 98.2°F | Ht 68.0 in | Wt 187.0 lb

## 2024-08-19 DIAGNOSIS — L509 Urticaria, unspecified: Secondary | ICD-10-CM

## 2024-08-19 DIAGNOSIS — R21 Rash and other nonspecific skin eruption: Secondary | ICD-10-CM

## 2024-08-19 MED ORDER — TRIAMCINOLONE ACETONIDE 40 MG/ML IJ SUSP
60.0000 mg | Freq: Once | INTRAMUSCULAR | Status: AC
  Administered 2024-08-19: 60 mg via INTRAMUSCULAR

## 2024-08-19 MED ORDER — TRIAMCINOLONE ACETONIDE 0.1 % EX CREA
1.0000 | TOPICAL_CREAM | Freq: Two times a day (BID) | CUTANEOUS | 1 refills | Status: AC
  Filled 2024-08-19: qty 45, 23d supply, fill #0

## 2024-08-19 MED ORDER — PREDNISONE 10 MG PO TABS
ORAL_TABLET | ORAL | 0 refills | Status: DC
  Filled 2024-08-19: qty 21, 6d supply, fill #0

## 2024-08-19 MED ORDER — CETIRIZINE HCL 10 MG PO TABS
10.0000 mg | ORAL_TABLET | Freq: Every day | ORAL | 2 refills | Status: AC | PRN
  Filled 2024-08-19: qty 30, 30d supply, fill #0
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
## 2024-08-29 DIAGNOSIS — R21 Rash and other nonspecific skin eruption: Secondary | ICD-10-CM | POA: Insufficient documentation

## 2024-08-29 DIAGNOSIS — L509 Urticaria, unspecified: Secondary | ICD-10-CM | POA: Insufficient documentation

## 2024-09-13 ENCOUNTER — Other Ambulatory Visit: Payer: Self-pay

## 2024-09-14 ENCOUNTER — Other Ambulatory Visit: Payer: Self-pay

## 2024-09-20 ENCOUNTER — Other Ambulatory Visit: Payer: Self-pay

## 2024-09-21 ENCOUNTER — Other Ambulatory Visit: Payer: Self-pay

## 2024-09-22 ENCOUNTER — Other Ambulatory Visit: Payer: Self-pay

## 2024-09-22 ENCOUNTER — Encounter: Payer: Self-pay | Admitting: Family Medicine

## 2024-09-22 ENCOUNTER — Telehealth (INDEPENDENT_AMBULATORY_CARE_PROVIDER_SITE_OTHER): Payer: Self-pay | Admitting: Family Medicine

## 2024-09-22 VITALS — BP 119/87 | HR 70 | Temp 96.6°F | Ht 68.0 in | Wt 187.0 lb

## 2024-09-22 DIAGNOSIS — F331 Major depressive disorder, recurrent, moderate: Secondary | ICD-10-CM

## 2024-09-22 DIAGNOSIS — F17211 Nicotine dependence, cigarettes, in remission: Secondary | ICD-10-CM

## 2024-09-22 MED ORDER — BUPROPION HCL ER (SR) 150 MG PO TB12
150.0000 mg | ORAL_TABLET | Freq: Two times a day (BID) | ORAL | 1 refills | Status: AC
  Filled 2024-09-22 – 2024-10-17 (×2): qty 180, 90d supply, fill #0

## 2024-09-22 NOTE — Assessment & Plan Note (Signed)
 Symptoms improved with Wellbutrin , indicating a positive response. - Continue Wellbutrin  150 mg twice daily. - Sent a 90-day supply of Wellbutrin .

## 2024-09-22 NOTE — Progress Notes (Signed)
 Virtual Visit via Video Note  I,Jameka J Llittleton, CMA,acting as a scribe for [employer], NP.,have documented all relevant documentation on the behalf of [name], NP,as directed by  [name], NP while in the presence of [name], NP.  I connected with Ricky [name] on 09/22/24 at 12:00 PM EST by a video enabled telemedicine application and verified that I am speaking with the correct person using two identifiers.  Patient Location: Home Provider Location: Office/Clinic  I discussed the limitations, risks, security, and privacy concerns of performing an evaluation and management service by video and the availability of in person appointments. I also discussed with the patient that there may be a patient responsible charge related to this service. The patient expressed understanding and agreed to proceed.  Subjective: PCP: Creighton Bruna, NP  Chief Complaint  Patient presents with   Depression    Patient presents today for a 2 month f/u on his depression. He was started on bupropion . Patient reports he is feeling pretty good.    Ricky [name] is a 48 year old male with depression who presents for a follow-up and medication evaluation.  Wellbutrin , initiated at the end of September, has significantly improved his depressive symptoms. He started with 150 mg daily for seven days and is now on 150 mg twice a day. He reports feeling much better and happier since starting the medication.  In addition to managing depression, Wellbutrin  has reduced his craving for cigarettes. He occasionally feels the urge to smoke, but it is less frequent than before. He quit smoking a few days before his last cardiologist appointment a few months ago and remains nicotine -free.  He has a history of a myocardial infarction around April 26 or 27 of the previous year. A follow-up with his cardiologist is scheduled for March or April of next year, during which another echocardiogram is  planned.  No chest pain or other concerning symptoms at this time. 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   Discussed the use of AI scribe software for clinical note transcription with the patient, who gave verbal consent to proceed.  History of Present Illness     ROS: Per HPI  Current Outpatient Medications:    aspirin  EC 81 MG tablet, Take 1 tablet (81 mg total) by mouth daily., Disp: 90 tablet, Rfl: 3   buPROPion  (WELLBUTRIN  SR) 150 MG 12 hr tablet, Take 1 tablet (150 mg total) by mouth 2 (two) times daily., Disp: 180 tablet, Rfl: 1   cetirizine  (ZYRTEC  ALLERGY) 10 MG tablet, Take 1 tablet (10 mg total) by mouth daily as needed for allergies., Disp: 30 tablet, Rfl: 2   Evolocumab  (REPATHA  SURECLICK) 140 MG/ML SOAJ, Inject 140 mg into the skin every 14 (fourteen) days., Disp: 6 mL, Rfl: 3   fexofenadine (ALLEGRA) 180 MG tablet, Take 180 mg by mouth daily., Disp: , Rfl:    furosemide  (LASIX ) 20 MG tablet, Take 1 tablet (20 mg total) by mouth as needed for fluid or edema., Disp: 90 tablet, Rfl: 3   isosorbide  mononitrate (IMDUR ) 30 MG 24 hr tablet, Take 1 tablet (30 mg total) by mouth daily., Disp: 90 tablet, Rfl: 3   losartan  (COZAAR ) 25 MG tablet, Take 1/2 tablet (12.5 mg total) by mouth daily., Disp: 45 tablet, Rfl: 3   metoprolol  succinate (TOPROL -XL) 25 MG 24 hr tablet, Take 1 tablet (25 mg total) by mouth daily., Disp: 90  tablet, Rfl: 3   nitroGLYCERIN  (NITROSTAT ) 0.4 MG SL tablet, Place 1 tablet (0.4 mg total) under the tongue every 5 (five) minutes x 3 doses as needed for chest pain., Disp: 75 tablet, Rfl: 3   prasugrel  (EFFIENT ) 10 MG TABS tablet, Take 1 tablet (10 mg total) by mouth daily., Disp: 90 tablet, Rfl: 3   ranolazine  (RANEXA ) 1000 MG SR tablet, Take 1 tablet (1,000 mg total) by mouth 2 (two) times daily., Disp: 180 tablet, Rfl: 2   rosuvastatin  (CRESTOR ) 40 MG tablet, Take 1 tablet (40 mg  total) by mouth daily., Disp: 90 tablet, Rfl: 3   triamcinolone  cream (KENALOG ) 0.1 %, Apply 1 Application topically 2 (two) times daily. (Patient not taking: Reported on 09/22/2024), Disp: 45 g, Rfl: 1  Observations/Objective: Today's Vitals   09/22/24 0918  BP: 119/87  Pulse: 70  Temp: (!) 96.6 F (35.9 C)  TempSrc: Oral  Weight: 187 lb (84.8 kg)  Height: 5' 8 (1.727 m)  PainSc: 0-No pain   Physical Exam Neurological:     Mental Status: He is alert and oriented to person, place, and time.     Assessment and Plan: Moderate episode of recurrent major depressive disorder (HCC) Assessment & Plan: Symptoms improved with Wellbutrin , indicating a positive response. - Continue Wellbutrin  150 mg twice daily. - Sent a 90-day supply of Wellbutrin .  Orders: -     buPROPion  HCl ER (SR); Take 1 tablet (150 mg total) by mouth 2 (two) times daily.  Dispense: 180 tablet; Refill: 1  Cigarette nicotine  dependence in remission Assessment & Plan: Nicotine  dependence in remission with occasional cravings. Wellbutrin  aids cessation. - Encouraged continued abstinence from nicotine . - Reinforced importance of quitting smoking for cardiovascular health.      Follow Up Instructions: Return in about 4 months (around 01/20/2025) for physical, depression.   I discussed the assessment and treatment plan with the patient. The patient was provided an opportunity to ask questions, and all were answered. The patient agreed with the plan and demonstrated an understanding of the instructions.   The patient was advised to call back or seek an in-person evaluation if the symptoms worsen or if the condition fails to improve as anticipated.  The above assessment and management plan was discussed with the patient. The patient verbalized understanding of and has agreed to the management plan.   I, [name], NP, have reviewed all documentation for this visit. The documentation on 09/22/2024 for the exam,  diagnosis, procedures, and orders are all accurate and complete.

## 2024-09-22 NOTE — Assessment & Plan Note (Signed)
 Nicotine  dependence in remission with occasional cravings. Wellbutrin  aids cessation. - Encouraged continued abstinence from nicotine . - Reinforced importance of quitting smoking for cardiovascular health.

## 2024-10-18 ENCOUNTER — Other Ambulatory Visit: Payer: Self-pay

## 2024-11-16 ENCOUNTER — Other Ambulatory Visit: Payer: Self-pay

## 2025-01-20 ENCOUNTER — Ambulatory Visit: Payer: Self-pay | Admitting: Family Medicine

## 2025-02-10 ENCOUNTER — Ambulatory Visit: Payer: Self-pay | Admitting: Cardiovascular Disease
